# Patient Record
Sex: Female | Born: 1945 | Race: White | Hispanic: No | Marital: Married | Smoking: Never smoker
Health system: Southern US, Community
[De-identification: ages and names within clinical notes are randomized; demographics above are authoritative.]

## PROBLEM LIST (undated history)

## (undated) DIAGNOSIS — Z800 Family history of malignant neoplasm of digestive organs: Secondary | ICD-10-CM

## (undated) DIAGNOSIS — Z806 Family history of leukemia: Secondary | ICD-10-CM

## (undated) DIAGNOSIS — K7400 Hepatic fibrosis, unspecified: Secondary | ICD-10-CM

## (undated) DIAGNOSIS — R32 Unspecified urinary incontinence: Secondary | ICD-10-CM

## (undated) DIAGNOSIS — K219 Gastro-esophageal reflux disease without esophagitis: Secondary | ICD-10-CM

## (undated) DIAGNOSIS — N393 Stress incontinence (female) (male): Secondary | ICD-10-CM

## (undated) DIAGNOSIS — E7800 Pure hypercholesterolemia, unspecified: Secondary | ICD-10-CM

## (undated) DIAGNOSIS — Z803 Family history of malignant neoplasm of breast: Secondary | ICD-10-CM

## (undated) DIAGNOSIS — R5381 Other malaise: Secondary | ICD-10-CM

## (undated) DIAGNOSIS — I10 Essential (primary) hypertension: Secondary | ICD-10-CM

## (undated) DIAGNOSIS — T7840XA Allergy, unspecified, initial encounter: Secondary | ICD-10-CM

## (undated) DIAGNOSIS — M810 Age-related osteoporosis without current pathological fracture: Secondary | ICD-10-CM

## (undated) DIAGNOSIS — Z973 Presence of spectacles and contact lenses: Secondary | ICD-10-CM

## (undated) DIAGNOSIS — R7309 Other abnormal glucose: Secondary | ICD-10-CM

## (undated) DIAGNOSIS — C801 Malignant (primary) neoplasm, unspecified: Secondary | ICD-10-CM

## (undated) DIAGNOSIS — IMO0002 Reserved for concepts with insufficient information to code with codable children: Secondary | ICD-10-CM

## (undated) DIAGNOSIS — F419 Anxiety disorder, unspecified: Secondary | ICD-10-CM

## (undated) DIAGNOSIS — R0989 Other specified symptoms and signs involving the circulatory and respiratory systems: Secondary | ICD-10-CM

## (undated) DIAGNOSIS — Z807 Family history of other malignant neoplasms of lymphoid, hematopoietic and related tissues: Secondary | ICD-10-CM

## (undated) HISTORY — DX: Anxiety disorder, unspecified: F41.9

## (undated) HISTORY — PX: COLONOSCOPY: SHX174

## (undated) HISTORY — PX: SQUAMOUS CELL CARCINOMA EXCISION: SHX2433

## (undated) HISTORY — DX: Family history of malignant neoplasm of digestive organs: Z80.0

## (undated) HISTORY — DX: Family history of leukemia: Z80.6

## (undated) HISTORY — DX: Hepatic fibrosis, unspecified: K74.00

## (undated) HISTORY — DX: Hepatic fibrosis: K74.0

## (undated) HISTORY — DX: Family history of other malignant neoplasms of lymphoid, hematopoietic and related tissues: Z80.7

## (undated) HISTORY — DX: Essential (primary) hypertension: I10

## (undated) HISTORY — DX: Pure hypercholesterolemia, unspecified: E78.00

## (undated) HISTORY — DX: Family history of malignant neoplasm of breast: Z80.3

## (undated) HISTORY — DX: Reserved for concepts with insufficient information to code with codable children: IMO0002

## (undated) HISTORY — DX: Gastro-esophageal reflux disease without esophagitis: K21.9

---
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
## 1988-02-21 HISTORY — PX: ABDOMINAL HYSTERECTOMY: SHX81

## 1997-06-02 ENCOUNTER — Ambulatory Visit (HOSPITAL_COMMUNITY): Admission: RE | Admit: 1997-06-02 | Discharge: 1997-06-02 | Payer: Self-pay | Admitting: *Deleted

## 1998-07-09 ENCOUNTER — Ambulatory Visit (HOSPITAL_COMMUNITY): Admission: RE | Admit: 1998-07-09 | Discharge: 1998-07-09 | Payer: Self-pay | Admitting: Family Medicine

## 1998-07-09 ENCOUNTER — Encounter: Payer: Self-pay | Admitting: Family Medicine

## 1999-08-17 ENCOUNTER — Encounter: Payer: Self-pay | Admitting: Family Medicine

## 1999-08-17 ENCOUNTER — Ambulatory Visit (HOSPITAL_COMMUNITY): Admission: RE | Admit: 1999-08-17 | Discharge: 1999-08-17 | Payer: Self-pay | Admitting: Family Medicine

## 2000-09-11 ENCOUNTER — Encounter: Payer: Self-pay | Admitting: Family Medicine

## 2000-09-11 ENCOUNTER — Ambulatory Visit (HOSPITAL_COMMUNITY): Admission: RE | Admit: 2000-09-11 | Discharge: 2000-09-11 | Payer: Self-pay | Admitting: Family Medicine

## 2002-07-01 ENCOUNTER — Encounter: Payer: Self-pay | Admitting: Family Medicine

## 2002-07-01 ENCOUNTER — Ambulatory Visit (HOSPITAL_COMMUNITY): Admission: RE | Admit: 2002-07-01 | Discharge: 2002-07-01 | Payer: Self-pay | Admitting: Family Medicine

## 2002-11-13 ENCOUNTER — Encounter: Payer: Self-pay | Admitting: Family Medicine

## 2002-11-13 ENCOUNTER — Encounter: Admission: RE | Admit: 2002-11-13 | Discharge: 2002-11-13 | Payer: Self-pay | Admitting: Family Medicine

## 2002-12-22 ENCOUNTER — Ambulatory Visit (HOSPITAL_COMMUNITY): Admission: RE | Admit: 2002-12-22 | Discharge: 2002-12-22 | Payer: Self-pay | Admitting: Gastroenterology

## 2003-10-20 ENCOUNTER — Ambulatory Visit (HOSPITAL_COMMUNITY): Admission: RE | Admit: 2003-10-20 | Discharge: 2003-10-20 | Payer: Self-pay | Admitting: Family Medicine

## 2004-11-03 DIAGNOSIS — F411 Generalized anxiety disorder: Secondary | ICD-10-CM | POA: Insufficient documentation

## 2004-11-03 DIAGNOSIS — K2100 Gastro-esophageal reflux disease with esophagitis, without bleeding: Secondary | ICD-10-CM | POA: Insufficient documentation

## 2004-11-03 DIAGNOSIS — I10 Essential (primary) hypertension: Secondary | ICD-10-CM | POA: Insufficient documentation

## 2004-11-03 DIAGNOSIS — R32 Unspecified urinary incontinence: Secondary | ICD-10-CM

## 2004-11-03 HISTORY — DX: Unspecified urinary incontinence: R32

## 2004-11-23 DIAGNOSIS — M159 Polyosteoarthritis, unspecified: Secondary | ICD-10-CM | POA: Insufficient documentation

## 2005-01-09 ENCOUNTER — Ambulatory Visit (HOSPITAL_COMMUNITY): Admission: RE | Admit: 2005-01-09 | Discharge: 2005-01-09 | Payer: Self-pay | Admitting: Family Medicine

## 2006-01-18 ENCOUNTER — Ambulatory Visit (HOSPITAL_COMMUNITY): Admission: RE | Admit: 2006-01-18 | Discharge: 2006-01-18 | Payer: Self-pay | Admitting: Family Medicine

## 2007-01-22 ENCOUNTER — Ambulatory Visit (HOSPITAL_COMMUNITY): Admission: RE | Admit: 2007-01-22 | Discharge: 2007-01-22 | Payer: Self-pay | Admitting: Family Medicine

## 2007-05-07 ENCOUNTER — Ambulatory Visit (HOSPITAL_BASED_OUTPATIENT_CLINIC_OR_DEPARTMENT_OTHER): Admission: RE | Admit: 2007-05-07 | Discharge: 2007-05-07 | Payer: Self-pay | Admitting: Urology

## 2008-02-18 ENCOUNTER — Ambulatory Visit (HOSPITAL_COMMUNITY): Admission: RE | Admit: 2008-02-18 | Discharge: 2008-02-18 | Payer: Self-pay | Admitting: Family Medicine

## 2008-02-21 HISTORY — PX: BLADDER SURGERY: SHX569

## 2009-04-20 ENCOUNTER — Ambulatory Visit (HOSPITAL_COMMUNITY): Admission: RE | Admit: 2009-04-20 | Discharge: 2009-04-20 | Payer: Self-pay | Admitting: Family Medicine

## 2010-02-02 DIAGNOSIS — R5381 Other malaise: Secondary | ICD-10-CM | POA: Insufficient documentation

## 2010-02-02 HISTORY — DX: Other malaise: R53.81

## 2010-06-14 ENCOUNTER — Other Ambulatory Visit (HOSPITAL_COMMUNITY): Payer: Self-pay | Admitting: Family Medicine

## 2010-06-14 DIAGNOSIS — Z1231 Encounter for screening mammogram for malignant neoplasm of breast: Secondary | ICD-10-CM

## 2010-06-14 DIAGNOSIS — M8580 Other specified disorders of bone density and structure, unspecified site: Secondary | ICD-10-CM

## 2010-07-05 NOTE — Op Note (Signed)
NAMESHERONICA, COREY NO.:  [id number]   MEDICAL RECORD NO.:  [id number]          PATIENT TYPE:  AMB   LOCATION:  [facility]                         FACILITY:  [facility]   PHYSICIAN:  Domingo Pulse, M.D.  DATE OF BIRTH:  December 13, 1945   DATE OF PROCEDURE:  05/07/2007  DATE OF DISCHARGE:                               OPERATIVE REPORT   PREOPERATIVE DIAGNOSIS:  Stress urinary incontinence.   POSTOPERATIVE DIAGNOSIS:  Stress urinary incontinence.   PROCEDURE:  Cystoscopy and pubovaginal sling.   SURGEON:  Domingo Pulse, M.D.   ANESTHESIA:  General.   COMPLICATIONS:  None.   DRAINS:  Foley catheter removed postoperatively.   HISTORY:  This 65 year old female has well documented stress urinary  continence.  She has a little of a voiding dysfunction not much the way  of urgency on her urodynamic study.  Cystoscopic examination revealed a  patulous bladder neck.  The patient is clearly symptomatic for stress  urinary incontinence and would like to have a pubovaginal sling.  She  understands the risks and benefits including the possibility of  postoperative urinary retention requiring additional revision.  We also  discussed the possible risk for erosion or extravasation.  The patient  understands the risks and benefits of the procedure and gave full  informed consent.   PROCEDURE:  After successful induction of general anesthesia the patient  was placed in the dorsal lithotomy position, prepped with Betadine and  draped in the usual sterile fashion.  Careful bimanual examination  revealed that the patient had very little in the way of a rectocele or  cystocele and the vault was well-supported.  Her anterior vaginal mucosa  was infiltrated with a local anesthetic.  The incision was made directly  over the midportion of the urethra.  Flaps of the vaginal mucosa were  raised bilaterally.  Dissection proceeded back to but not through the  endopelvic fascia.  Two small  incisions were made to directed above the  pubis approximately three fingerbreadths apart.  A needle was then  passed from the small abdominal incision down to the vaginal incision on  each side.  Cystoscopic examination performed with both the 12 degree  and 70 degree lenses.  The bladder was carefully inspected.  No tumors  or stones could be seen.  Both orifices normal in configuration and  location.  The needle did not enter the bladder in any way and place and  appeared to be optimal.  The sling was then attached to the needle and  was pulled up through the top incision.  The sling tension was then set  using a Mayo scissors between the urethra and the sling.  Cystoscopic  examination done as the sling was being set showed that the urethra was  then a neutral position without angulation.  The bladder did not leak  but with a crede maneuver there was a prompt straight flow of urine and  it appeared that this was not over or under corrected.  The blue tab was  then cut in the midline and the sheath for the sling was removed setting  the tension.  The incision was irrigated and then closed with 2-0  Vicryl.  The sling was cut at the skin surface and Dermabond was used to  close the skin.  The patient had packing placed that will be removed  prior to her voiding trial.  The patient tolerated procedure well and  was taken to recovery room in  good condition.  She will be given Tylox as well as doxycycline and will  return to see me in follow-up in two weeks' time.  If the patient is  unable to urinate, she will be sent home for several days with Foley  catheter and then have an early voiding trial.      Domingo Pulse, M.D.  Electronically Signed     RJE/MEDQ  D:  05/07/2007  T:  05/07/2007  Job:  [id number]

## 2010-07-08 NOTE — Op Note (Signed)
   NAMESHAUNTEE, KARP                       ACCOUNT NO.:  [id number]   MEDICAL RECORD NO.:  [id number]                   PATIENT TYPE:  AMB   LOCATION:  [facility]O                                 FACILITY:  [facility]   PHYSICIAN:  Nelwyn Salisbury, M.D.               DATE OF BIRTH:  1945/04/19   DATE OF PROCEDURE:  12/22/2002  DATE OF DISCHARGE:                                 OPERATIVE REPORT   PROCEDURE:  Screening colonoscopy.   ENDOSCOPIST:  Nelwyn Salisbury, M.D.   INSTRUMENT USED:  Olympus video colonoscope.   INDICATIONS FOR PROCEDURE:  A 65 year old white female undergoing screening  colonoscopy to rule out colonic polyps, masses, etc.   PREPROCEDURE PREPARATION:  Informed consent was procured from the patient.  The patient was fasted for eight hours prior to the procedure and prepped  with a bottle of MiraLax and Gatorade the night prior to the procedure.   PREPROCEDURE PHYSICAL EXAMINATION:  VITAL SIGNS:  Stable.  NECK:  Supple.  CHEST:  Clear to auscultation. S1 and S2 regular.  ABDOMEN:  Soft with normal bowel sounds.   DESCRIPTION OF PROCEDURE:  The patient was placed in the left lateral  decubitus position and sedated 70 mg of Demerol and 7 mg of Versed in slow  incremental doses.  Once the patient was adequately sedated and maintained  on low flow oxygen, continuous cardiac monitoring, the Olympus video  colonoscope was advanced from the rectum to the cecum with difficulty.  There was a large amount of residual stool in the colon.  Multiple washings  were done.  The appendiceal orifice and ileocecal valve were visualized and  photographed.  The terminal ileum appeared normal.  No masses, polyps,  erosions, ulcerations, or diverticuli were seen.  Prominent anal papilla  were seen on retroflexion with small internal hemorrhoids.  The patient  tolerated the procedure well without complications.   IMPRESSION:  1. Small nonbleeding internal hemorrhoids.  2. Prominent  anal papilla.  3. No masses or polyps seen.  4. Large amount of residual stool in the colon.  Small lesions could have     been missed.    RECOMMENDATIONS:  Continue a high fiber diet with liberal fluid intake.  Repeat CRC screening in the next five years as visualization was not  adequate on colonoscopy today.  Outpatient follow-up is to be advised in the  future.                                               Nelwyn Salisbury, M.D.    JNM/MEDQ  D:  12/22/2002  T:  12/22/2002  Job:  [id number]   cc:   Youlanda Roys. Deatra Ina, M.D.  [address]  [place]  [place] [address]  Fax: [phone]

## 2010-07-14 ENCOUNTER — Ambulatory Visit (HOSPITAL_COMMUNITY)
Admission: RE | Admit: 2010-07-14 | Discharge: 2010-07-14 | Disposition: A | Discharge status: Home / Self Care | Payer: Federal, State, Local not specified - PPO | Source: Ambulatory Visit | Attending: Family Medicine | Admitting: Family Medicine

## 2010-07-14 DIAGNOSIS — Z1231 Encounter for screening mammogram for malignant neoplasm of breast: Secondary | ICD-10-CM | POA: Insufficient documentation

## 2010-07-14 DIAGNOSIS — M8580 Other specified disorders of bone density and structure, unspecified site: Secondary | ICD-10-CM

## 2010-07-14 DIAGNOSIS — Z13820 Encounter for screening for osteoporosis: Secondary | ICD-10-CM | POA: Insufficient documentation

## 2010-07-14 DIAGNOSIS — Z780 Asymptomatic menopausal state: Secondary | ICD-10-CM | POA: Insufficient documentation

## 2010-08-04 DIAGNOSIS — F3289 Other specified depressive episodes: Secondary | ICD-10-CM | POA: Insufficient documentation

## 2010-08-04 DIAGNOSIS — F329 Major depressive disorder, single episode, unspecified: Secondary | ICD-10-CM | POA: Insufficient documentation

## 2010-11-14 LAB — POCT I-STAT 4, (NA,K, GLUC, HGB,HCT)
Glucose, Bld: 98
Hemoglobin: 12.6
Sodium: 140

## 2011-05-02 DIAGNOSIS — R7309 Other abnormal glucose: Secondary | ICD-10-CM

## 2011-05-02 HISTORY — DX: Other abnormal glucose: R73.09

## 2011-08-01 ENCOUNTER — Other Ambulatory Visit (HOSPITAL_COMMUNITY): Payer: Self-pay | Admitting: Family Medicine

## 2011-08-01 DIAGNOSIS — Z1231 Encounter for screening mammogram for malignant neoplasm of breast: Secondary | ICD-10-CM

## 2011-08-10 DIAGNOSIS — M810 Age-related osteoporosis without current pathological fracture: Secondary | ICD-10-CM | POA: Insufficient documentation

## 2011-08-31 ENCOUNTER — Ambulatory Visit (HOSPITAL_COMMUNITY)
Admission: RE | Admit: 2011-08-31 | Discharge: 2011-08-31 | Disposition: A | Discharge status: Home / Self Care | Payer: Federal, State, Local not specified - PPO | Source: Ambulatory Visit | Attending: Family Medicine | Admitting: Family Medicine

## 2011-08-31 DIAGNOSIS — Z1231 Encounter for screening mammogram for malignant neoplasm of breast: Secondary | ICD-10-CM | POA: Insufficient documentation

## 2012-08-26 DIAGNOSIS — E782 Mixed hyperlipidemia: Secondary | ICD-10-CM | POA: Insufficient documentation

## 2012-09-16 ENCOUNTER — Other Ambulatory Visit (HOSPITAL_COMMUNITY): Payer: Self-pay | Admitting: Family Medicine

## 2012-09-16 DIAGNOSIS — Z1231 Encounter for screening mammogram for malignant neoplasm of breast: Secondary | ICD-10-CM

## 2012-09-16 DIAGNOSIS — M810 Age-related osteoporosis without current pathological fracture: Secondary | ICD-10-CM

## 2012-10-23 ENCOUNTER — Ambulatory Visit (HOSPITAL_COMMUNITY)
Admission: RE | Admit: 2012-10-23 | Discharge: 2012-10-23 | Disposition: A | Discharge status: Home / Self Care | Payer: Medicare Other | Source: Ambulatory Visit | Attending: Family Medicine | Admitting: Family Medicine

## 2012-10-23 DIAGNOSIS — Z1231 Encounter for screening mammogram for malignant neoplasm of breast: Secondary | ICD-10-CM | POA: Insufficient documentation

## 2012-10-23 DIAGNOSIS — M810 Age-related osteoporosis without current pathological fracture: Secondary | ICD-10-CM

## 2012-10-23 DIAGNOSIS — Z780 Asymptomatic menopausal state: Secondary | ICD-10-CM | POA: Insufficient documentation

## 2012-10-23 DIAGNOSIS — Z13820 Encounter for screening for osteoporosis: Secondary | ICD-10-CM | POA: Insufficient documentation

## 2013-05-30 DIAGNOSIS — N63 Unspecified lump in unspecified breast: Secondary | ICD-10-CM | POA: Diagnosis not present

## 2013-06-02 ENCOUNTER — Ambulatory Visit
Admission: RE | Admit: 2013-06-02 | Discharge: 2013-06-02 | Disposition: A | Discharge status: Home / Self Care | Payer: Federal, State, Local not specified - PPO | Source: Ambulatory Visit | Attending: Nurse Practitioner | Admitting: Nurse Practitioner

## 2013-06-02 ENCOUNTER — Ambulatory Visit
Admission: RE | Admit: 2013-06-02 | Discharge: 2013-06-02 | Disposition: A | Discharge status: Home / Self Care | Payer: Medicare Other | Source: Ambulatory Visit | Attending: Nurse Practitioner | Admitting: Nurse Practitioner

## 2013-06-02 ENCOUNTER — Other Ambulatory Visit: Payer: Self-pay | Admitting: Nurse Practitioner

## 2013-06-02 DIAGNOSIS — N63 Unspecified lump in unspecified breast: Secondary | ICD-10-CM

## 2013-06-02 DIAGNOSIS — C50919 Malignant neoplasm of unspecified site of unspecified female breast: Secondary | ICD-10-CM | POA: Diagnosis not present

## 2013-06-02 DIAGNOSIS — Z803 Family history of malignant neoplasm of breast: Secondary | ICD-10-CM | POA: Diagnosis not present

## 2013-06-03 ENCOUNTER — Other Ambulatory Visit: Payer: Self-pay | Admitting: Nurse Practitioner

## 2013-06-03 ENCOUNTER — Ambulatory Visit
Admission: RE | Admit: 2013-06-03 | Discharge: 2013-06-03 | Disposition: A | Discharge status: Home / Self Care | Payer: Medicare Other | Source: Ambulatory Visit | Attending: Nurse Practitioner | Admitting: Nurse Practitioner

## 2013-06-03 DIAGNOSIS — C50919 Malignant neoplasm of unspecified site of unspecified female breast: Secondary | ICD-10-CM

## 2013-06-03 DIAGNOSIS — N63 Unspecified lump in unspecified breast: Secondary | ICD-10-CM

## 2013-06-05 ENCOUNTER — Telehealth: Payer: Self-pay | Admitting: *Deleted

## 2013-06-05 DIAGNOSIS — Z170 Estrogen receptor positive status [ER+]: Secondary | ICD-10-CM

## 2013-06-05 DIAGNOSIS — C50411 Malignant neoplasm of upper-outer quadrant of right female breast: Secondary | ICD-10-CM

## 2013-06-05 NOTE — Telephone Encounter (Signed)
Confirmed [facility] for 06/11/13 at 1230pm .  Instructions and contact information given.

## 2013-06-09 ENCOUNTER — Ambulatory Visit
Admission: RE | Admit: 2013-06-09 | Discharge: 2013-06-09 | Disposition: A | Discharge status: Home / Self Care | Payer: Medicare Other | Source: Ambulatory Visit | Attending: Nurse Practitioner | Admitting: Nurse Practitioner

## 2013-06-09 DIAGNOSIS — C50919 Malignant neoplasm of unspecified site of unspecified female breast: Secondary | ICD-10-CM | POA: Diagnosis not present

## 2013-06-09 MED ORDER — GADOBENATE DIMEGLUMINE 529 MG/ML IV SOLN
12.0000 mL | Freq: Once | INTRAVENOUS | Status: AC | PRN
  Administered 2013-06-09: 12 mL via INTRAVENOUS

## 2013-06-11 ENCOUNTER — Encounter (INDEPENDENT_AMBULATORY_CARE_PROVIDER_SITE_OTHER): Payer: Self-pay

## 2013-06-11 ENCOUNTER — Ambulatory Visit (HOSPITAL_BASED_OUTPATIENT_CLINIC_OR_DEPARTMENT_OTHER): Payer: Medicare Other | Admitting: Oncology

## 2013-06-11 ENCOUNTER — Telehealth: Payer: Self-pay | Admitting: Oncology

## 2013-06-11 ENCOUNTER — Ambulatory Visit: Payer: Medicare Other

## 2013-06-11 ENCOUNTER — Encounter: Payer: Self-pay | Admitting: Oncology

## 2013-06-11 ENCOUNTER — Ambulatory Visit
Admission: RE | Admit: 2013-06-11 | Discharge: 2013-06-11 | Disposition: A | Discharge status: Home / Self Care | Payer: Medicare Other | Source: Ambulatory Visit | Attending: Radiation Oncology | Admitting: Radiation Oncology

## 2013-06-11 ENCOUNTER — Ambulatory Visit (HOSPITAL_BASED_OUTPATIENT_CLINIC_OR_DEPARTMENT_OTHER): Payer: Medicare Other | Admitting: General Surgery

## 2013-06-11 ENCOUNTER — Ambulatory Visit: Payer: Medicare Other | Attending: General Surgery | Admitting: Physical Therapy

## 2013-06-11 VITALS — BP 152/76 | HR 69 | Temp 98.5°F | Resp 18 | Ht 59.0 in | Wt 133.8 lb

## 2013-06-11 DIAGNOSIS — C50411 Malignant neoplasm of upper-outer quadrant of right female breast: Secondary | ICD-10-CM

## 2013-06-11 DIAGNOSIS — Z170 Estrogen receptor positive status [ER+]: Secondary | ICD-10-CM | POA: Diagnosis not present

## 2013-06-11 DIAGNOSIS — C50419 Malignant neoplasm of upper-outer quadrant of unspecified female breast: Secondary | ICD-10-CM

## 2013-06-11 DIAGNOSIS — C50919 Malignant neoplasm of unspecified site of unspecified female breast: Secondary | ICD-10-CM | POA: Diagnosis not present

## 2013-06-11 DIAGNOSIS — R293 Abnormal posture: Secondary | ICD-10-CM | POA: Insufficient documentation

## 2013-06-11 DIAGNOSIS — IMO0001 Reserved for inherently not codable concepts without codable children: Secondary | ICD-10-CM | POA: Diagnosis not present

## 2013-06-11 LAB — COMPREHENSIVE METABOLIC PANEL (CC13)
ALT: 25 U/L (ref 0–55)
AST: 24 U/L (ref 5–34)
Albumin: 4.1 g/dL (ref 3.5–5.0)
Alkaline Phosphatase: 64 U/L (ref 40–150)
Anion Gap: 12 mEq/L — ABNORMAL HIGH (ref 3–11)
BILIRUBIN TOTAL: 0.26 mg/dL (ref 0.20–1.20)
BUN: 11 mg/dL (ref 7.0–26.0)
CALCIUM: 10 mg/dL (ref 8.4–10.4)
CHLORIDE: 112 meq/L — AB (ref 98–109)
CO2: 19 meq/L — AB (ref 22–29)
Creatinine: 0.7 mg/dL (ref 0.6–1.1)
Glucose: 107 mg/dl (ref 70–140)
POTASSIUM: 4 meq/L (ref 3.5–5.1)
Sodium: 143 mEq/L (ref 136–145)
Total Protein: 7.5 g/dL (ref 6.4–8.3)

## 2013-06-11 LAB — CBC WITH DIFFERENTIAL/PLATELET
BASO%: 0.7 % (ref 0.0–2.0)
Basophils Absolute: 0.1 10*3/uL (ref 0.0–0.1)
EOS ABS: 0 10*3/uL (ref 0.0–0.5)
EOS%: 0.5 % (ref 0.0–7.0)
HCT: 38.3 % (ref 34.8–46.6)
HGB: 12.6 g/dL (ref 11.6–15.9)
LYMPH%: 20.6 % (ref 14.0–49.7)
MCH: 31.1 pg (ref 25.1–34.0)
MCHC: 32.8 g/dL (ref 31.5–36.0)
MCV: 94.9 fL (ref 79.5–101.0)
MONO#: 0.6 10*3/uL (ref 0.1–0.9)
MONO%: 7.1 % (ref 0.0–14.0)
NEUT%: 71.1 % (ref 38.4–76.8)
NEUTROS ABS: 6.3 10*3/uL (ref 1.5–6.5)
Platelets: 354 10*3/uL (ref 145–400)
RBC: 4.04 10*6/uL (ref 3.70–5.45)
RDW: 12.5 % (ref 11.2–14.5)
WBC: 8.9 10*3/uL (ref 3.9–10.3)
lymph#: 1.8 10*3/uL (ref 0.9–3.3)

## 2013-06-11 NOTE — Progress Notes (Signed)
[place]  Telephone:(336) [phone] Fax:(336) [phone]     ID: [name] OB: 11/20/1945  MR#: [id number]  W[facility]#:[id number]  PCP: Florina Ou, MD GYN:   SU: Excell Seltzer OTHER MD: Kyung Rudd  CHIEF COMPLAINT: "I found a lump in the right breast"  BREAST CANCER HISTORY: Mignon was bending over to get into her hot tub when she noted a mass in her right breast. She intended to keep it to herself, but her husband of 54 years, [name], noted her changed expression, asked her what it was about, and when she told him they got out of the top and called on her primary care physician. On 06/02/2013 the patient underwent right breast mammography and ultrasonography,  which showed an area of increased density in the superior aspect of the right breast, which was palpable and firm. By palpation it measured approximately 5 cm. Ultrasound located an irregular mass in the right breast at the 12:00 position measuring 4 cm maximally. Ultrasound of the right axilla showed normal axillary contents.  Biopsy of the right breast mass in question 06/02/2013 ([id number]) confirmed an invasive lobular tumor, grade 2, estrogen and progesterone receptors both 100% positive in both with strong staining intensity, with an MIB-1 of 80%. HER-2 is pending.  On 06/03/2013 the patient underwent left breast mammography, which was negative. On 06/09/2013 the patient underwent bilateral breast MRI. This showed, in the left breast, multiple enhancing nodules in the central portion measuring up to 4.6 cm in aggregate. There was evidence of nipple retraction. The largest single  discrete mass measured 2.7 cm. There was no involvement of the pectoralis and no abnormal appearing lymph nodes. The right breast was unremarkable.  The patient's subsequent history is as detailed below  INTERVAL HISTORY: Menarche was evaluated in the multidisciplinary breast cancer clinic accompanied by her husband Wille Glaser and their  daughters Chrissie and Almyra Free  REVIEW OF SYSTEMS: Aside from the breast changes, there were no specific symptoms leading to the diagnostic mammogram. The patient denies unusual headaches, visual changes, nausea, vomiting, stiff neck, dizziness, or gait imbalance. There has been no cough, phlegm production, or pleurisy, no chest pain or pressure, and no change in bowel or bladder habits. The patient denies fever, rash, bleeding, unexplained fatigue or unexplained weight loss. The patient complains of rare hot flashes and occasional night sweats. She feels anxious but not depressed. A detailed review of systems was otherwise entirely negative.  PAST MEDICAL HISTORY: Past Medical History  Diagnosis Date  . Hypertension   . GERD (gastroesophageal reflux disease)   . High cholesterol   . Anxiety     PAST SURGICAL HISTORY: Past Surgical History  Procedure Laterality Date  . Abdominal hysterectomy    . Bladder surgery      FAMILY HISTORY Family History  Problem Relation Age of Onset  . Breast cancer Mother   . Breast cancer Paternal Aunt   . Lymphoma Cousin     non- hodgkins lymphoma   the patient's father died with congestive heart failure at the age of 52. The patient's mother died from "multiple causes" at the age of 16. Baker Janus had one brother, no sisters. The patient's mother was diagnosed with breast cancer the age of 64 there is also a paternal aunt diagnosed with breast cancer at the age of 32.  GYNECOLOGIC HISTORY:  Menarche age 68, first live birth age 68, the patient is [place] P2. She stopped having periods approximately 1990. She did not take hormone replacement.  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 Her husband Broadus John (goes by "choked") used to work Sales executive stations. Their name is pronounced "Nam-zura"--it is of [place] origin. Daughter Alyse Low lives in  [place] for she is a Nurse, children's and daughter Almyra Free lives in [place] where she is a housewife. The patient has 1 grandchild. She attends a [employer].    ADVANCED DIRECTIVES: Not in place   HEALTH MAINTENANCE: History  Substance Use Topics  . Smoking status: Never Smoker   . Smokeless tobacco: Not on file  . Alcohol Use: 3.0 oz/week    5 Glasses of wine per week     Colonoscopy: 2001?/Dr. Collene Mares  PAP:  Bone density: 2014/osteopenia  Lipid panel:  Allergies not on file  No current outpatient prescriptions on file.   No current facility-administered medications for this visit.    OBJECTIVE: Middle-aged white woman who appears stated age 68 Vitals:   06/11/13 1317  BP: 152/76  Pulse: 69  Temp: 98.5 F (36.9 C)  Resp: 18     Body mass index is 27.01 kg/(m^2).    ECOG FS:0 - Asymptomatic  Ocular: Sclerae unicteric, pupils equal, round and reactive to light Ear-nose-throat: Oropharynx clear and moist Lymphatic: No cervical or supraclavicular adenopathy Lungs no rales or rhonchi, good excursion bilaterally Heart regular rate and rhythm, no murmur appreciated Abd soft, nontender, positive bowel sounds MSK no focal spinal tenderness, no joint edema Neuro: non-focal, well-oriented, appropriate affect Breasts: The right breast is status post recent biopsy. The right nipple is slightly sunken, and there is a palpable area of thickening in the subareolar area measuring perhaps 4 cm. It is difficult to delineate. There are no skin changes of concern. The right axilla is benign. Left breast is unremarkable.   LAB RESULTS:  CMP     Component Value Date/Time   NA 143 06/11/2013 1306   NA 140 05/07/2007 0721   K 4.0 06/11/2013 1306   K 4.1 05/07/2007 0721   CO2 19* 06/11/2013 1306   GLUCOSE 107 06/11/2013 1306   GLUCOSE 98 05/07/2007 0721   BUN 11.0 06/11/2013 1306   CREATININE 0.7 06/11/2013 1306   CALCIUM 10.0 06/11/2013 1306   PROT 7.5 06/11/2013 1306     ALBUMIN 4.1 06/11/2013 1306   AST 24 06/11/2013 1306   ALT 25 06/11/2013 1306   ALKPHOS 64 06/11/2013 1306   BILITOT 0.26 06/11/2013 1306    I No results found for this basename: SPEP, UPEP,  kappa and lambda light chains    Lab Results  Component Value Date   WBC 8.9 06/11/2013   NEUTROABS 6.3 06/11/2013   HGB 12.6 06/11/2013   HCT 38.3 06/11/2013   MCV 94.9 06/11/2013   PLT 354 06/11/2013      Chemistry      Component Value Date/Time   NA 143 06/11/2013 1306   NA 140 05/07/2007 0721   K 4.0 06/11/2013 1306   K 4.1 05/07/2007 0721   CO2 19* 06/11/2013 1306   BUN 11.0 06/11/2013 1306   CREATININE 0.7 06/11/2013 1306      Component Value Date/Time   CALCIUM 10.0 06/11/2013 1306   ALKPHOS 64 06/11/2013 1306   AST 24 06/11/2013 1306   ALT 25 06/11/2013 1306   BILITOT 0.26 06/11/2013 1306       No results found for this basename: LABCA2  No components found with this basename: GLOVF643    No results found for this basename: INR,  in the last 168 hours  Urinalysis No results found for this basename: colorurine, appearanceur, labspec, phurine, glucoseu, hgbur, bilirubinur, ketonesur, proteinur, urobilinogen, nitrite, leukocytesur    STUDIES: Mr Breast Bilateral W Wo Contrast  06/09/2013   CLINICAL DATA:  Recent diagnosis of invasive mammary carcinoma, 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  LABS:  BUN and creatinine were obtained on site at [facility] at  [address]  Results:  BUN 13 mg/dL,  Creatinine 0.6 mg/dL.  EXAM: BILATERAL BREAST MRI WITH AND WITHOUT CONTRAST  TECHNIQUE: Multiplanar, multisequence MR images of both breasts were obtained prior to and following the intravenous administration of 78m of MultiHance.  THREE-DIMENSIONAL MR IMAGE RENDERING ON INDEPENDENT WORKSTATION:  Three-dimensional MR images were rendered by post-processing of the original MR  data on an independent workstation. The three-dimensional MR images were interpreted, and findings are reported in the following complete MRI report for this study. Three dimensional images were evaluated at the independent DynaCad workstation  COMPARISON:  [facility] and earlier  FINDINGS: Breast composition: c:  Heterogeneous fibroglandular tissue  Background parenchymal enhancement: Minimal  Right breast: No mass or abnormal enhancement.  Left breast: There is right nipple retraction without nipple enhancement. Multiple nodules of enhancement are identified within the central portion of the right breast. Measured together, these are 4.6 x 3.3 x 3.7 cm. Enhancement is medium washout type kinetics. The largest discrete mass is 2.7 cm. Tissue marker clip is identified in the region of enhancement, consistent with recent ultrasound-guided core biopsy with showed malignancy. There is no pectoralis muscle involvement.  Lymph nodes: No abnormal appearing lymph nodes.  Ancillary findings:  None.  IMPRESSION: 1. 4.6 cm of abnormal enhancement in the central portion of the right breast. Findings are consistent with known malignancy. 2. Associated retraction of the right nipple without evidence for direct involvement of the nipple. 3. Left breast is negative.  RECOMMENDATION: Treatment plan  BI-RADS CATEGORY  6: Known biopsy-proven malignancy.   Electronically Signed   By: BShon HaleM.D.   On: 06/09/2013 13:27   Mm Digital Diagnostic Unilat L  06/03/2013   CLINICAL DATA:  Recently diagnosed right breast invasive mammary carcinoma. Pre MRI left breast mammogram requested.  EXAM: DIGITAL DIAGNOSTIC  left breast MAMMOGRAM WITH CAD  COMPARISON:  Previous examinations dated 10/23/2012, 08/31/2011, 07/14/2010, 04/20/2009.  ACR Breast Density Category c: The breast tissue is heterogeneously dense, which may obscure small masses.  FINDINGS: There is no mass, distortion,  or worrisome calcification within the left breast.  Mammographic images were processed with CAD.  IMPRESSION: Stable left breast parenchymal pattern. No findings worrisome for malignancy within the left breast.  RECOMMENDATION: Preoperative breast MRI. Treatment plan for known malignancy within the right breast.  I have discussed the findings and recommendations with the patient. Results were also provided in writing at the conclusion of the visit. If applicable, a reminder letter will be sent to the patient regarding the next appointment.  BI-RADS CATEGORY  1: Negative.   Electronically Signed   By: RLuberta RobertsonM.D.   On: 06/03/2013 16:04   Mm Digital Diagnostic Unilat R  06/02/2013   CLINICAL DATA:  Post right breast ultrasound-guided biopsy.  EXAM: POST-BIOPSY CLIP PLACEMENT right breast DIAGNOSTIC MAMMOGRAM  COMPARISON:  Previous exams.  FINDINGS: Films are performed following ultrasound guided biopsy of the mass located within the right breast at 12 o'clock position. The ribbon shaped clip is in appropriate position.  IMPRESSION: Appropriate position of clip following right breast ultrasound-guided core biopsy.  Final Assessment: Post Procedure Mammograms for Marker Placement   Electronically Signed   By: Luberta Robertson M.D.   On: 06/02/2013 15:49   Mm Digital Diagnostic Unilat R  06/02/2013   CLINICAL DATA:  Palpable right breast mass. Family history of breast cancer in her sister at age 54.  EXAM: DIGITAL DIAGNOSTIC  right breast MAMMOGRAM WITH CAD  ULTRASOUND right BREAST  COMPARISON:  10/23/2012, 08/31/2011, 07/14/2010, 04/20/2009.  ACR Breast Density Category c: The breast tissue is heterogeneously dense, which may obscure small masses.  FINDINGS: There is an area of focal increased density seen within the superior subareolar portion of the right breast located at the 12 o'clock position.  Mammographic images were processed with CAD.  On physical exam, there is a firm palpable mass located within  the right breast at 12 o'clock position 4 cm from the nipple which by physical examination measures approximately 5 cm in size. There is no palpable right axillary adenopathy.  Ultrasound is performed, showing a heterogeneous, irregular mass located within the right breast at 12 o'clock position 4 cm from the nipple measuring 4.0 x 3.7 x 2.7 cm in size. This is associated with increased vascularity on color-flow imaging. This is worrisome for invasive mammary carcinoma.  Ultrasound of the right axilla demonstrates normal axillary contents and no evidence for adenopathy.  I have discussed ultrasound-guided core biopsy of the right breast mass located at 12 o'clock position. The patient would like to proceed with this at this time. This will be performed and reported separately.  IMPRESSION: 4 cm suspicious mass located within the right breast at 12 o'clock position as discussed above. Tissue sampling is recommended and ultrasound-guided core biopsy will be performed and reported separately.  RECOMMENDATION: Right breast ultrasound-guided core biopsy.  I have discussed the findings and recommendations with the patient. Results were also provided in writing at the conclusion of the visit. If applicable, a reminder letter will be sent to the patient regarding the next appointment.  BI-RADS CATEGORY  4: Suspicious abnormality - biopsy should be considered.   Electronically Signed   By: Luberta Robertson M.D.   On: 06/02/2013 15:29   Mm Radiologist Eval And Mgmt  06/03/2013   EXAM: ESTABLISHED PATIENT OFFICE VISIT - LEVEL II ([id number])  CHIEF COMPLAINT: The patient returns for results following right breast ultrasound-guided core biopsy.  Current Pain Level: 2  HISTORY OF PRESENT ILLNESS: Patient noted a palpable mass within the right breast. Diagnostic mammography and ultrasound demonstrated a mass located within the right breast at 12 o'clock position and right breast ultrasound-guided core biopsy was performed. There is a  family history of breast cancer in her mother at age 71.  EXAM: There is moderate ecchymosis associated with the right breast ultrasound-guided core biopsy site. There is no hematoma formation and there are no signs of infection. Patient is mildly tender.  PATHOLOGY: The pathology demonstrated invasive mammary carcinoma felt to most likely be a lobular carcinoma - grade 2. The pathology is concordant with the imaging findings.  ASSESSMENT AND PLAN: ASSESSMENT AND PLAN The patient is referred to the breast cancer multi disciplinary clinic on 06/11/2013. Breast MRI is scheduled for 06/09/2013. Post biopsy wound care instructions were reviewed with the  patient. The patient was asked to contact the [facility] for additional questions or concerns.   Electronically Signed   By: Luberta Robertson M.D.   On: 06/03/2013 16:12   US Breast Ltd Uni Right Inc Axilla  06/02/2013   CLINICAL DATA:  Palpable right breast mass. Family history of breast cancer in her sister at age 39.  EXAM: DIGITAL DIAGNOSTIC  right breast MAMMOGRAM WITH CAD  ULTRASOUND right BREAST  COMPARISON:  10/23/2012, 08/31/2011, 07/14/2010, 04/20/2009.  ACR Breast Density Category c: The breast tissue is heterogeneously dense, which may obscure small masses.  FINDINGS: There is an area of focal increased density seen within the superior subareolar portion of the right breast located at the 12 o'clock position.  Mammographic images were processed with CAD.  On physical exam, there is a firm palpable mass located within the right breast at 12 o'clock position 4 cm from the nipple which by physical examination measures approximately 5 cm in size. There is no palpable right axillary adenopathy.  Ultrasound is performed, showing a heterogeneous, irregular mass located within the right breast at 12 o'clock position 4 cm from the nipple measuring 4.0 x 3.7 x 2.7 cm in size. This is associated with increased vascularity on color-flow imaging. This is worrisome for  invasive mammary carcinoma.  Ultrasound of the right axilla demonstrates normal axillary contents and no evidence for adenopathy.  I have discussed ultrasound-guided core biopsy of the right breast mass located at 12 o'clock position. The patient would like to proceed with this at this time. This will be performed and reported separately.  IMPRESSION: 4 cm suspicious mass located within the right breast at 12 o'clock position as discussed above. Tissue sampling is recommended and ultrasound-guided core biopsy will be performed and reported separately.  RECOMMENDATION: Right breast ultrasound-guided core biopsy.  I have discussed the findings and recommendations with the patient. Results were also provided in writing at the conclusion of the visit. If applicable, a reminder letter will be sent to the patient regarding the next appointment.  BI-RADS CATEGORY  4: Suspicious abnormality - biopsy should be considered.   Electronically Signed   By: Luberta Robertson M.D.   On: 06/02/2013 15:29   [place] Rt Breast Bx W Loc Dev 1st Lesion Img Bx Spec US Guide  06/02/2013   CLINICAL DATA:  Palpable right breast mass.  EXAM: ULTRASOUND GUIDED right BREAST CORE NEEDLE BIOPSY  COMPARISON:  Previous exams.  FINDINGS: I met with the patient and we discussed the procedure of fultrasound-guided biopsy, including benefits and alternatives. We discussed the high likelihood of a successful procedure. We discussed the risks of the procedure, including infection, bleeding, tissue injury, clip migration, and inadequate sampling. Informed written consent was given. The usual time-out protocol was performed immediately prior to the procedure.  Using sterile technique and 2% Lidocaine as local anesthetic, under direct ultrasound visualization, a 14 gauge spring-loaded device was used to perform biopsy of the mass located within the right breast at 12 o'clock position using a medial approach. At the conclusion of the procedure a ribbon shaped  tissue marker clip was deployed into the biopsy cavity. Follow up 2 view mammogram was performed and dictated separately.  IMPRESSION: Ultrasound guided biopsy of the right breast mass located at the 12 o'clock position as discussed above. No apparent complications.   Electronically Signed   By: Luberta Robertson M.D.   On: 06/02/2013 15:30    ASSESSMENT: 68 y.o. [place] woman status post right breast biopsy  06/02/2013 for a clinical T2 N0, stage IIA invasive lobular breast cancer, grade 2, estrogen receptor and progesterone receptor both outer percent positive, with an MIB-1 of 80%, and HER-2 pending  PLAN: We spent the better part of today's hour-long appointment discussing the biology of breast cancer in general, and the specifics of the patient's tumor in particular. Naoko understands that quite aside from local treatment, which in her case likely will consist of mastectomy and sentinel lymph node sampling, she will need some kind of systemic therapy.  Certainly she will benefit from antiestrogen. The question is whether she would benefit from chemotherapy as well. In general, lobular breast cancers tend to benefit less, but hers is grade 2 with a high proliferation fraction.  Accordingly we are sending an Oncotype in her case. This should help [place] towards a decision. We should be able to have those results within 3 weeks, and so I will see her around that time to discuss the chemotherapy option.  Meah has a good understanding of the overall plan. She agrees with it. She knows that the goal of treatment in her case is cure. She will call with any problems that may develop before her next visit here.   Chauncey Cruel, MD   06/11/2013 2:42 PM

## 2013-06-11 NOTE — Progress Notes (Signed)
Chief Complaint: New diagnosis of breast cancer  History:    Penny Howard is a 68 y.o. postmenopausal female referred by Dr. Luberta Robertson  for evaluation of recently diagnosed carcinoma of the right breast. The patient states that she recently noted inversion of her right nipple and then a central right breast mass several weeks ago. She presented to her family physician and was referred to the breast center for further evaluation.  Subsequent imaging included diagnostic mamogram showing an area of focal increased density in the superior and subareolar portion of the right breast 12:00 position and ultrasound showing a heterogeneous irregular mass at the 12:00 position and retroareolar measuring 4 cm. Ultrasound of the right axilla was negative..   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 ssubsequent breast MR has revealed a 4.6 cm area of nodular enhancement in the central and retroareolar right breast.She is seen now in breast multidisciplinary clinic for initial treatment planning.  She has experienced a breast lump on exam and nipple inversion as above.  She does not have a personal history of any previous breast problems.  Findings at that time were the following:  Tumor size: 4.6 cm  Tumor grade: 3  Estrogen Receptor: positive Progesterone Receptor: positive  Her-2 neu: pending  Lymph node status: negative Neurovascular invasion: no mention of biopsy Lymphatic invasion: no mention on biopsy  Past Medical History  Diagnosis Date  . Hypertension   . GERD (gastroesophageal reflux disease)   . High cholesterol   . Anxiety     Past Surgical History  Procedure Laterality Date  . Abdominal hysterectomy    . Bladder surgery      No current outpatient prescriptions on file.   No current facility-administered medications for this visit.    Family History  Problem Relation Age of Onset  . Breast  cancer Mother   . Breast cancer Paternal Aunt   . Lymphoma Cousin     non- hodgkins lymphoma    History   Social History  . Marital Status: Married    Spouse Name: N/A    Number of Children: N/A  . Years of Education: N/A   Social History Main Topics  . Smoking status: Never Smoker   . Smokeless tobacco: Not on file  . Alcohol Use: 3.0 oz/week    5 Glasses of wine per week  . Drug Use: No  . Sexual Activity: Not on file   Other Topics Concern  . Not on file   Social History Narrative  . No narrative on file     Review of Systems Constitutional: negative Respiratory: negative Cardiovascular: negative Gastrointestinal: negative, colonoscopy is up to date     Objective:  There were no vitals taken for this visit.  General: Alert, well-developed Caucasian female, in no distress Skin: Warm and dry without rash or infection. HEENT: No palpable masses or thyromegaly. Sclera nonicteric. Pupils equal round and reactive. Oropharynx clear. Breasts: there is bruising of the right breast post biopsy. Right nipple inversion is noted. There is a fairly large ill-defined mass just beneath and above the nipple. No fixation. No palpable axillary adenopathy bilaterally. Left breast is negative to exam. Lymph nodes: No cervical, supraclavicular, or inguinal nodes palpable. Lungs: Breath sounds clear and equal without increased work of breathing Cardiovascular: Regular rate and rhythm without murmur. No JVD or edema. Peripheral pulses intact. Abdomen: Nondistended. Soft and nontender. No masses palpable. No organomegaly. No palpable  hernias. Extremities: No edema or joint swelling or deformity. No chronic venous stasis changes. Neurologic: Alert and fully oriented. Gait normal.   Laboratory data:  CBC:  Lab Results  Component Value Date   WBC 8.9 06/11/2013   RBC 4.04 06/11/2013   HGB 12.6 06/11/2013   HGB 12.6 05/07/2007   HCT 38.3 06/11/2013   HCT 37.0 05/07/2007   PLT 354  06/11/2013  ]  [facility] Labs:  Lab Results  Component Value Date   NA 143 06/11/2013   NA 140 05/07/2007   K 4.0 06/11/2013   K 4.1 05/07/2007   CO2 19* 06/11/2013   BUN 11.0 06/11/2013   CREATININE 0.7 06/11/2013   CALCIUM 10.0 06/11/2013   PROT 7.5 06/11/2013   BILITOT 0.26 06/11/2013   ALKPHOS 64 06/11/2013   AST 24 06/11/2013   ALT 25 06/11/2013     Assessment  68 y.o. female with a new diagnosis of cancer of the the right breast central and upper breast.  Clinical IIA, estrogen receptor positive, progesterone receptor positive and Ki 67 - growth factor high. I discussed with the patient and family members present today initial surgical treatment options. We discussed options of breast conservation with lumpectomy or total mastectomy and sentinal lymph node biopsy/dissection. Due to the large size and the central location of her tumor I do not believe she would be a candidate for breast conservation and have recommended total mastectomy.Options for reconstruction were discussed. She is very interested in immediate reconstruction. I think this would be feasible unless she would require postmastectomy radiation which it seems she would not. After discussion they have elected to proceed with right total mastectomy.  We discussed the indications and nature of the procedure, and expected recovery, in detail. Surgical risks including anesthetic complications, cardiorespiratory complications, bleeding, infection, wound healing complications, blood clots, lymphedema, local and distant recurrence and possible need for further surgery based on the final pathology was discussed and understood.  Chemotherapy, hormonal therapy and radiation therapy have been discussed. They have been provided with literature regarding the treatment of breast cancer.  All questions were answered. They understand and agree to proceed and we will go ahead with scheduling.  Plan Right total mastectomy with axillary sentinel lymph node  biopsy and possible axillary dissection. The patient is interested in immediate reconstruction and we will refer to  Plastic surgery for consultation as soon as possible.  Edward Jolly MD, FACS  06/11/2013, 3:24 PM

## 2013-06-11 NOTE — Telephone Encounter (Signed)
, °

## 2013-06-11 NOTE — Progress Notes (Signed)
Checked in new pt with no financial concerns. °

## 2013-06-12 ENCOUNTER — Encounter: Payer: Self-pay | Admitting: *Deleted

## 2013-06-12 DIAGNOSIS — C50911 Malignant neoplasm of unspecified site of right female breast: Secondary | ICD-10-CM | POA: Insufficient documentation

## 2013-06-12 DIAGNOSIS — C50919 Malignant neoplasm of unspecified site of unspecified female breast: Secondary | ICD-10-CM | POA: Diagnosis not present

## 2013-06-12 NOTE — Progress Notes (Signed)
[place] Psychosocial Distress Screening Clinical Social Work  Clinical Social Work met with Patient and family at breast clinic.  The patient scored a 3 on the Psychosocial Distress Thermometer which indicates mild distress. Clinical Social Worker Intern spoke with to assess for distress and other psychosocial needs. Patient was given written information about Patient and Family support services.  Patient agrees to contact if further assistance is needed.   Clinical Social Worker follow up needed: no  If yes, follow up plan:   Alley Neils S. [place] Work Intern [employer] [phone]

## 2013-06-12 NOTE — Progress Notes (Signed)
Oncotype DX ordered per Dr. Jana Hakim.  Requisition sent to pathology.  Received by Edmonia Lynch

## 2013-06-16 ENCOUNTER — Telehealth: Payer: Self-pay | Admitting: *Deleted

## 2013-06-16 ENCOUNTER — Telehealth (INDEPENDENT_AMBULATORY_CARE_PROVIDER_SITE_OTHER): Payer: Self-pay | Admitting: General Surgery

## 2013-06-16 ENCOUNTER — Telehealth (INDEPENDENT_AMBULATORY_CARE_PROVIDER_SITE_OTHER): Payer: Self-pay

## 2013-06-16 NOTE — Telephone Encounter (Signed)
Patient is asking why is it taking so long to have her breast ca surgery scheduled, she feels her cancer is going to spread if she continues to wait. Please advise

## 2013-06-16 NOTE — Telephone Encounter (Signed)
Received call from patient stating she has left several messages for [facility] with no return phone call about scheduling her surgery.  She states she met with Dr. Iran Planas on 06/12/13 and has decided to have just the mastectomy right now without reconstruction.  Informed her I would send a message to Dr. Excell Seltzer and his nurse to call her and get her surgery scheduled.  Encouraged patient to call with any other needs or concerns.

## 2013-06-16 NOTE — Telephone Encounter (Signed)
pt is not coordinating with Dr Iran Planas when can she have surgery  States she is waiting .. I explained that Dr Excell Seltzer would be out of office and back first week of May he will check his email and can instruct that way  That his calender is scheduled out til late may or June

## 2013-06-17 ENCOUNTER — Encounter (HOSPITAL_COMMUNITY): Payer: Self-pay

## 2013-06-18 NOTE — Progress Notes (Signed)
Radiation Oncology         [phone]) [phone] ________________________________  Name: Penny Howard MRN: [id number]  Date: 06/11/2013  DOB: Jul 04, 1945  KT:GYBWL, Lynelle Smoke, MD  Hoxworth, Darene Lamer, MD     REFERRING PHYSICIAN: Excell Seltzer Darene Lamer, MD   DIAGNOSIS: The encounter diagnosis was Breast cancer of upper-outer quadrant of right female breast.   HISTORY OF PRESENT ILLNESS::Penny Howard is a 68 y.o. female who is seen for an initial consultation visit. The patient is seen today in multidisciplinary breast clinic. She indicates that she felt a palpable mass within the right breast several weeks ago. She has not had any history of breast issues in the past. She was referred to the breast Center for further evaluation. A diagnostic mammogram showed an area of increased density within the right breast centrally and an ultrasound was performed. This confirmed a heterogeneous irregular mass in the retroareolar region measuring 4 cm. No signs of right-sided lymphadenopathy on ultrasound of the right axilla.   An ultrasound-guided biopsy was performed. This revealed invasive mammary carcinoma. Receptor studies have indicated that the tumor is ER positive and PR positive. The Ki-67 was 80%. The HER-2/neu status will be repeated.  The patient underwent an MRI scan of the breasts bilaterally. This revealed a 4.6 cm area of abnormal enhancement within the central portion of the right breast. This finding was consistent with the known malignancy. Associated retraction of the right nipple was noted. The left rest was negative. No abnormal appearing lymph nodes were present.   PREVIOUS RADIATION THERAPY: No   PAST MEDICAL HISTORY:  has a past medical history of Hypertension; GERD (gastroesophageal reflux disease); High cholesterol; and Anxiety.     PAST SURGICAL HISTORY: Past Surgical History  Procedure Laterality Date  . Abdominal hysterectomy    . Bladder surgery       FAMILY HISTORY:  family history includes Breast cancer in her mother and paternal aunt; Lymphoma in her cousin.   SOCIAL HISTORY:  reports that she has never smoked. She does not have any smokeless tobacco history on file. She reports that she drinks about 3 ounces of alcohol per week. She reports that she does not use illicit drugs.   ALLERGIES: Review of patient's allergies indicates not on file.   MEDICATIONS:  No current outpatient prescriptions on file.   No current facility-administered medications for this encounter.     REVIEW OF SYSTEMS:  A 15 point review of systems is documented in the electronic medical record. This was obtained by the nursing staff. However, I reviewed this with the patient to discuss relevant findings and make appropriate changes.  Pertinent items are noted in HPI.    PHYSICAL EXAM:  vitals were not taken for this visit.  ECOG = 0  0 - Asymptomatic (Fully active, able to carry on all predisease activities without restriction)  1 - Symptomatic but completely ambulatory (Restricted in physically strenuous activity but ambulatory and able to carry out work of a light or sedentary nature. For example, light housework, office work)  2 - Symptomatic, <50% in bed during the day (Ambulatory and capable of all self care but unable to carry out any work activities. Up and about more than 50% of waking hours)  3 - Symptomatic, >50% in bed, but not bedbound (Capable of only limited self-care, confined to bed or chair 50% or more of waking hours)  4 - Bedbound (Completely disabled. Cannot carry on any self-care. Totally confined to bed or chair)  5 - Death   Eustace Pen MM, Creech RH, Tormey DC, et al. [phone]). "Toxicity and response criteria of the [facility]". [place] Oncol. 5 (6): 649-55  General: Well-developed, in no acute distress HEENT: Normocephalic, atraumatic; oral cavity clear Neck: Supple without any lymphadenopathy Cardiovascular: Regular rate  and rhythm Respiratory: Clear to auscultation bilaterally Breasts:  Bruising is present in the right breast. She is status post biopsy. Nipple inversion noted. Postbiopsy changes/mass is present centrally within the breast superior to the nipple. This is mobile. No axillary adenopathy present. Left breast exam negative GI: Soft, nontender, normal bowel sounds Extremities: No edema present Neuro: No focal deficits     LABORATORY DATA:  Lab Results  Component Value Date   WBC 8.9 06/11/2013   HGB 12.6 06/11/2013   HCT 38.3 06/11/2013   MCV 94.9 06/11/2013   PLT 354 06/11/2013   Lab Results  Component Value Date   NA 143 06/11/2013   K 4.0 06/11/2013   CO2 19* 06/11/2013   Lab Results  Component Value Date   ALT 25 06/11/2013   AST 24 06/11/2013   ALKPHOS 64 06/11/2013   BILITOT 0.26 06/11/2013      RADIOGRAPHY: Mr Breast Bilateral W Wo Contrast  06/09/2013   CLINICAL DATA:  Recent diagnosis of invasive mammary carcinoma, 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  LABS:  BUN and creatinine were obtained on site at [facility] at  [address]  Results:  BUN 13 mg/dL,  Creatinine 0.6 mg/dL.  EXAM: BILATERAL BREAST MRI WITH AND WITHOUT CONTRAST  TECHNIQUE: Multiplanar, multisequence MR images of both breasts were obtained prior to and following the intravenous administration of 17m of MultiHance.  THREE-DIMENSIONAL MR IMAGE RENDERING ON INDEPENDENT WORKSTATION:  Three-dimensional MR images were rendered by post-processing of the original MR data on an independent workstation. The three-dimensional MR images were interpreted, and findings are reported in the following complete MRI report for this study. Three dimensional images were evaluated at the independent DynaCad workstation  COMPARISON:  Mammogram from [facility] 06/03/2013 and earlier   FINDINGS: Breast composition: c:  Heterogeneous fibroglandular tissue  Background parenchymal enhancement: Minimal  Right breast: No mass or abnormal enhancement.  Left breast: There is right nipple retraction without nipple enhancement. Multiple nodules of enhancement are identified within the central portion of the right breast. Measured together, these are 4.6 x 3.3 x 3.7 cm. Enhancement is medium washout type kinetics. The largest discrete mass is 2.7 cm. Tissue marker clip is identified in the region of enhancement, consistent with recent ultrasound-guided core biopsy with showed malignancy. There is no pectoralis muscle involvement.  Lymph nodes: No abnormal appearing lymph nodes.  Ancillary findings:  None.  IMPRESSION: 1. 4.6 cm of abnormal enhancement in the central portion of the right breast. Findings are consistent with known malignancy. 2. Associated retraction of the right nipple without evidence for direct involvement of the nipple. 3. Left breast is negative.  RECOMMENDATION: Treatment plan  BI-RADS CATEGORY  6: Known biopsy-proven malignancy.   Electronically Signed   By: BShon HaleM.D.   On: 06/09/2013 13:27   Mm Digital Diagnostic Unilat L  06/03/2013   CLINICAL DATA:  Recently diagnosed right breast invasive mammary carcinoma. Pre MRI left breast mammogram requested.  EXAM: DIGITAL DIAGNOSTIC  left breast MAMMOGRAM WITH CAD  COMPARISON:  Previous examinations dated  10/23/2012, 08/31/2011, 07/14/2010, 04/20/2009.  ACR Breast Density Category c: The breast tissue is heterogeneously dense, which may obscure small masses.  FINDINGS: There is no mass, distortion, or worrisome calcification within the left breast.  Mammographic images were processed with CAD.  IMPRESSION: Stable left breast parenchymal pattern. No findings worrisome for malignancy within the left breast.  RECOMMENDATION: Preoperative breast MRI. Treatment plan for known malignancy within the right breast.  I have discussed the  findings and recommendations with the patient. Results were also provided in writing at the conclusion of the visit. If applicable, a reminder letter will be sent to the patient regarding the next appointment.  BI-RADS CATEGORY  1: Negative.   Electronically Signed   By: Luberta Robertson M.D.   On: 06/03/2013 16:04   Mm Digital Diagnostic Unilat R  06/02/2013   CLINICAL DATA:  Post right breast ultrasound-guided biopsy.  EXAM: POST-BIOPSY CLIP PLACEMENT right breast DIAGNOSTIC MAMMOGRAM  COMPARISON:  Previous exams.  FINDINGS: Films are performed following ultrasound guided biopsy of the mass located within the right breast at 12 o'clock position. The ribbon shaped clip is in appropriate position.  IMPRESSION: Appropriate position of clip following right breast ultrasound-guided core biopsy.  Final Assessment: Post Procedure Mammograms for Marker Placement   Electronically Signed   By: Luberta Robertson M.D.   On: 06/02/2013 15:49   Mm Digital Diagnostic Unilat R  06/02/2013   CLINICAL DATA:  Palpable right breast mass. Family history of breast cancer in her sister at age 56.  EXAM: DIGITAL DIAGNOSTIC  right breast MAMMOGRAM WITH CAD  ULTRASOUND right BREAST  COMPARISON:  10/23/2012, 08/31/2011, 07/14/2010, 04/20/2009.  ACR Breast Density Category c: The breast tissue is heterogeneously dense, which may obscure small masses.  FINDINGS: There is an area of focal increased density seen within the superior subareolar portion of the right breast located at the 12 o'clock position.  Mammographic images were processed with CAD.  On physical exam, there is a firm palpable mass located within the right breast at 12 o'clock position 4 cm from the nipple which by physical examination measures approximately 5 cm in size. There is no palpable right axillary adenopathy.  Ultrasound is performed, showing a heterogeneous, irregular mass located within the right breast at 12 o'clock position 4 cm from the nipple measuring 4.0 x  3.7 x 2.7 cm in size. This is associated with increased vascularity on color-flow imaging. This is worrisome for invasive mammary carcinoma.  Ultrasound of the right axilla demonstrates normal axillary contents and no evidence for adenopathy.  I have discussed ultrasound-guided core biopsy of the right breast mass located at 12 o'clock position. The patient would like to proceed with this at this time. This will be performed and reported separately.  IMPRESSION: 4 cm suspicious mass located within the right breast at 12 o'clock position as discussed above. Tissue sampling is recommended and ultrasound-guided core biopsy will be performed and reported separately.  RECOMMENDATION: Right breast ultrasound-guided core biopsy.  I have discussed the findings and recommendations with the patient. Results were also provided in writing at the conclusion of the visit. If applicable, a reminder letter will be sent to the patient regarding the next appointment.  BI-RADS CATEGORY  4: Suspicious abnormality - biopsy should be considered.   Electronically Signed   By: Luberta Robertson M.D.   On: 06/02/2013 15:29   Mm Radiologist Eval And Mgmt  06/03/2013   EXAM: ESTABLISHED PATIENT OFFICE VISIT - LEVEL II [phone])  CHIEF COMPLAINT:  The patient returns for results following right breast ultrasound-guided core biopsy.  Current Pain Level: 2  HISTORY OF PRESENT ILLNESS: Patient noted a palpable mass within the right breast. Diagnostic mammography and ultrasound demonstrated a mass located within the right breast at 12 o'clock position and right breast ultrasound-guided core biopsy was performed. There is a family history of breast cancer in her mother at age 51.  EXAM: There is moderate ecchymosis associated with the right breast ultrasound-guided core biopsy site. There is no hematoma formation and there are no signs of infection. Patient is mildly tender.  PATHOLOGY: The pathology demonstrated invasive mammary carcinoma felt to  most likely be a lobular carcinoma - grade 2. The pathology is concordant with the imaging findings.  ASSESSMENT AND PLAN: ASSESSMENT AND PLAN The patient is referred to the breast cancer multi disciplinary clinic on 06/11/2013. Breast MRI is scheduled for 06/09/2013. Post biopsy wound care instructions were reviewed with the patient. The patient was asked to contact the [facility] for additional questions or concerns.   Electronically Signed   By: Luberta Robertson M.D.   On: 06/03/2013 16:12   US Breast Ltd Uni Right Inc Axilla  06/02/2013   CLINICAL DATA:  Palpable right breast mass. Family history of breast cancer in her sister at age 73.  EXAM: DIGITAL DIAGNOSTIC  right breast MAMMOGRAM WITH CAD  ULTRASOUND right BREAST  COMPARISON:  10/23/2012, 08/31/2011, 07/14/2010, 04/20/2009.  ACR Breast Density Category c: The breast tissue is heterogeneously dense, which may obscure small masses.  FINDINGS: There is an area of focal increased density seen within the superior subareolar portion of the right breast located at the 12 o'clock position.  Mammographic images were processed with CAD.  On physical exam, there is a firm palpable mass located within the right breast at 12 o'clock position 4 cm from the nipple which by physical examination measures approximately 5 cm in size. There is no palpable right axillary adenopathy.  Ultrasound is performed, showing a heterogeneous, irregular mass located within the right breast at 12 o'clock position 4 cm from the nipple measuring 4.0 x 3.7 x 2.7 cm in size. This is associated with increased vascularity on color-flow imaging. This is worrisome for invasive mammary carcinoma.  Ultrasound of the right axilla demonstrates normal axillary contents and no evidence for adenopathy.  I have discussed ultrasound-guided core biopsy of the right breast mass located at 12 o'clock position. The patient would like to proceed with this at this time. This will be performed and reported  separately.  IMPRESSION: 4 cm suspicious mass located within the right breast at 12 o'clock position as discussed above. Tissue sampling is recommended and ultrasound-guided core biopsy will be performed and reported separately.  RECOMMENDATION: Right breast ultrasound-guided core biopsy.  I have discussed the findings and recommendations with the patient. Results were also provided in writing at the conclusion of the visit. If applicable, a reminder letter will be sent to the patient regarding the next appointment.  BI-RADS CATEGORY  4: Suspicious abnormality - biopsy should be considered.   Electronically Signed   By: Luberta Robertson M.D.   On: 06/02/2013 15:29   [place] Rt Breast Bx W Loc Dev 1st Lesion Img Bx Spec US Guide  06/02/2013   CLINICAL DATA:  Palpable right breast mass.  EXAM: ULTRASOUND GUIDED right BREAST CORE NEEDLE BIOPSY  COMPARISON:  Previous exams.  FINDINGS: I met with the patient and we discussed the procedure of fultrasound-guided biopsy, including benefits and alternatives.  We discussed the high likelihood of a successful procedure. We discussed the risks of the procedure, including infection, bleeding, tissue injury, clip migration, and inadequate sampling. Informed written consent was given. The usual time-out protocol was performed immediately prior to the procedure.  Using sterile technique and 2% Lidocaine as local anesthetic, under direct ultrasound visualization, a 14 gauge spring-loaded device was used to perform biopsy of the mass located within the right breast at 12 o'clock position using a medial approach. At the conclusion of the procedure a ribbon shaped tissue marker clip was deployed into the biopsy cavity. Follow up 2 view mammogram was performed and dictated separately.  IMPRESSION: Ultrasound guided biopsy of the right breast mass located at the 12 o'clock position as discussed above. No apparent complications.   Electronically Signed   By: Luberta Robertson M.D.   On:  06/02/2013 15:30       IMPRESSION: The patient is a pleasant 68 year old female with a recent diagnosis of invasive mammary carcinoma of the right breast. The histology suggests possible lobular phenotype. Clinically this represents a T2, N0, M0 tumor. The patient's case was discussed in conference as well as in multidisciplinary clinic this afternoon. Due to the size and location of the tumor, surgical options were reviewed with the patient with a feeling that mastectomy would be most appropriate for her. She indicates that she would like to proceed with this treatment strategy.  I reviewed with the patient the potential role for radiation treatment in the setting of breast cancer. We specifically discussed the possible role for postmastectomy radiation treatment. Given her current presentation, there is not a clear definite role for postmastectomy radiation. However, we discussed that the patient's surgical results/pathology would determine the final recommendation. It is possible therefore that the patient would be criteria for postoperative radiation. The patient has findings on MRI scan which could potentially be upgraded to a T3 tumor on final pathology. The largest discrete mass however was 2.7 cm. The patient is also aware that nodal status can also impact the recommendation for radiation treatment.  We also discussed issues related to reconstruction, specifically the potential strategies in terms of timing of reconstruction. We discussed how this is impacted by radiation treatment. The patient after this discussion indicates that she is strongly motivated to proceed with immediate reconstruction and expressed an understanding of the issues involved.  The patient also has seen the medical oncology today and discussed systemic treatment. An Oncotype test will be ordered.   PLAN: The patient will proceed with surgery and the recommendations for systemic treatment and radiation will be clarified  after additional information including pathology and Oncotype test. I will look forward to reviewing the patient's pathology to determine whether the patient requires further discussion of postmastectomy radiation treatment. Again, on the basis of her current clinical presentation there is not a clear definite role for radiation if she proceeds with mastectomy.       ________________________________   Jodelle Gross, MD, PhD

## 2013-06-19 ENCOUNTER — Telehealth (INDEPENDENT_AMBULATORY_CARE_PROVIDER_SITE_OTHER): Payer: Self-pay

## 2013-06-19 NOTE — Telephone Encounter (Signed)
Called patient and left message to call our office RE:  post op appointment date & time for 07/10/13 @ 11:40 am w/Dr. Excell Seltzer.

## 2013-06-19 NOTE — Telephone Encounter (Signed)
Patient is scheduled for surgery on 07/04/13.  Not sure if patient's aware of her surgery date but, she is scheduled.

## 2013-06-19 NOTE — Telephone Encounter (Signed)
Message copied by Ivor Costa on Thu Jun 19, 2013  4:40 PM ------      Message from: Over[place] Mam      Created: Wed Jun 18, 2013  3:02 PM      Regarding: Follow up appointment       She is schedule on 15 May for surgery but still waiting on the orders for the nuc med injection but go ahead and see what you can do for the follow up.            I told her you would call with this when you had one.            Thanks      ----- Message -----         From: Ivor Costa, CMA         Sent: 06/16/2013   3:32 PM           To: [place] Mam      Subject: RE: I have printed this off                              [place] have her surgery scheduling slip and plan to schedule patient and call with a date.            Jarelis Ehlert      ----- Message -----         From: [place] Mam         Sent: 06/16/2013   2:31 PM           To: Rudene Christians, Ivor Costa, CMA, #      Subject: I have printed this off                                              ----- Message -----         From: Edward Jolly, MD         Sent: 06/16/2013   2:22 PM           To: Rudene Christians, [facility]            Surgeon: Edward Jolly            Patient: Penny Howard   DOB: Aug 16, 1945   MRN: [id number]            Location: [facility]            Admit status: overnight observation            Procedure: right total mastectomy with right axillary sentinel lymph node biopsy            Diagnosis:cancer of the right breast            Size of lesion:    SQ?   Subfascial?                  Position:supine            Anesthesia:General            Assistant?:if available            Estimated time: 1.5 hours            May patient be same day labs: Yes  Mark any special requests:      NL/SM?:                 Films at:      NeoProbe?: yes             C-Arm?:       Mesh?:      Harmonic Scalpel?:      Yellow Fin Stirrups?:      CC Clearance?:      Co-ordinate  case?:         Miscellaneous Notes: please call the patient to schedule. Please call today if possible. Patient cannot wait until end of May. Please schedule during LDOW week of May 11 at [facility] , preferably not Monday.            Darene Lamer Hoxworth      06/16/2013      2:22 PM                               ------

## 2013-06-23 ENCOUNTER — Other Ambulatory Visit (INDEPENDENT_AMBULATORY_CARE_PROVIDER_SITE_OTHER): Payer: Self-pay | Admitting: General Surgery

## 2013-06-24 ENCOUNTER — Encounter (HOSPITAL_COMMUNITY): Payer: Self-pay | Admitting: Pharmacy Technician

## 2013-06-25 ENCOUNTER — Other Ambulatory Visit (INDEPENDENT_AMBULATORY_CARE_PROVIDER_SITE_OTHER): Payer: Self-pay | Admitting: General Surgery

## 2013-06-25 DIAGNOSIS — C50911 Malignant neoplasm of unspecified site of right female breast: Secondary | ICD-10-CM

## 2013-06-26 ENCOUNTER — Telehealth (INDEPENDENT_AMBULATORY_CARE_PROVIDER_SITE_OTHER): Payer: Self-pay | Admitting: *Deleted

## 2013-06-26 NOTE — Telephone Encounter (Signed)
Pt left a message on my voicemail stating she is having surgery on 07/04/13 and she needs Dr. Excell Seltzer to send orders to [facility] for Surgical Prothesis?  Please advise!  Anderson Malta

## 2013-06-27 ENCOUNTER — Encounter (HOSPITAL_COMMUNITY): Payer: Self-pay

## 2013-06-27 ENCOUNTER — Ambulatory Visit (HOSPITAL_COMMUNITY)
Admission: RE | Admit: 2013-06-27 | Discharge: 2013-06-27 | Disposition: A | Discharge status: Home / Self Care | Payer: Medicare Other | Source: Ambulatory Visit | Attending: Anesthesiology | Admitting: Anesthesiology

## 2013-06-27 ENCOUNTER — Encounter (HOSPITAL_COMMUNITY)
Admission: RE | Admit: 2013-06-27 | Discharge: 2013-06-27 | Disposition: A | Discharge status: Home / Self Care | Payer: Medicare Other | Source: Ambulatory Visit | Attending: General Surgery | Admitting: General Surgery

## 2013-06-27 DIAGNOSIS — C50919 Malignant neoplasm of unspecified site of unspecified female breast: Secondary | ICD-10-CM | POA: Diagnosis not present

## 2013-06-27 DIAGNOSIS — Z01812 Encounter for preprocedural laboratory examination: Secondary | ICD-10-CM | POA: Insufficient documentation

## 2013-06-27 DIAGNOSIS — Z01810 Encounter for preprocedural cardiovascular examination: Secondary | ICD-10-CM | POA: Insufficient documentation

## 2013-06-27 DIAGNOSIS — Z01818 Encounter for other preprocedural examination: Secondary | ICD-10-CM | POA: Diagnosis not present

## 2013-06-27 HISTORY — DX: Malignant (primary) neoplasm, unspecified: C80.1

## 2013-06-27 LAB — BASIC METABOLIC PANEL
BUN: 13 mg/dL (ref 6–23)
CALCIUM: 9.6 mg/dL (ref 8.4–10.5)
CO2: 22 meq/L (ref 19–32)
CREATININE: 0.65 mg/dL (ref 0.50–1.10)
Chloride: 105 mEq/L (ref 96–112)
GFR calc Af Amer: >90 mL/min (ref >90)
GFR calc non Af Amer: >90 mL/min (ref >90)
Glucose, Bld: 127 mg/dL — ABNORMAL HIGH (ref 70–99)
Potassium: 4.1 mEq/L (ref 3.7–5.3)
Sodium: 140 mEq/L (ref 137–147)

## 2013-06-27 LAB — CBC
HCT: 38.4 % (ref 36.0–46.0)
Hemoglobin: 12.5 g/dL (ref 12.0–15.0)
MCH: 31.3 pg (ref 26.0–34.0)
MCHC: 32.6 g/dL (ref 30.0–36.0)
MCV: 96 fL (ref 78.0–100.0)
Platelets: 309 10*3/uL (ref 150–400)
RBC: 4 MIL/uL (ref 3.87–5.11)
RDW: 12.6 % (ref 11.5–15.5)
WBC: 6.2 10*3/uL (ref 4.0–10.5)

## 2013-06-27 NOTE — Progress Notes (Signed)
req'd notes ,ekg from pcp dr Lynelle Smoke spear

## 2013-06-27 NOTE — Pre-Procedure Instructions (Addendum)
[name]  06/27/2013   Your procedure is scheduled on:  07/04/13  [facility] short [facility] admitting at 530 AM.  Call this number if you have problems the morning of surgery: [phone]   Remember:   Do not eat food or drink liquids after midnight.   Take these medicines the morning of surgery with A SIP OF WATER: lexapro, nexium     Take all meds as ordered until day of surgery except as instructed below or per dr       Bridgette Habermann all herbel meds, nsaids (aleve,naproxen,advil,ibuprofen) 5 days prior to surgery including aspirin, vitamins,fish oil, red yeast rice extract   Do not wear jewelry, make-up or nail polish.  Do not wear lotions, powders, or perfumes. You may wear deodorant.  Do not shave 48 hours prior to surgery. Men may shave face and neck.  Do not bring valuables to the hospital.  [facility] is not responsible                  for any belongings or valuables.               Contacts, dentures or bridgework may not be worn into surgery.  Leave suitcase in the car. After surgery it may be brought to your room.  For patients admitted to the hospital, discharge time is determined by your                treatment team.               Patients discharged the day of surgery will not be allowed to drive  home.  Name and phone number of your driver:   Special Instructions:  Special Instructions: [place] - Preparing for Surgery  Before surgery, you can play an important role.  Because skin is not sterile, your skin needs to be as free of germs as possible.  You can reduce the number of germs on you skin by washing with CHG (chlorahexidine gluconate) soap before surgery.  CHG is an antiseptic cleaner which kills germs and bonds with the skin to continue killing germs even after washing.  Please DO NOT use if you have an allergy to CHG or antibacterial soaps.  If your skin becomes reddened/irritated stop using the CHG and inform your nurse when you arrive at Short  Stay.  Do not shave (including legs and underarms) for at least 48 hours prior to the first CHG shower.  You may shave your face.  Please follow these instructions carefully:   1.  Shower with CHG Soap the night before surgery and the morning of Surgery.  2.  If you choose to wash your hair, wash your hair first as usual with your normal shampoo.  3.  After you shampoo, rinse your hair and body thoroughly to remove the Shampoo.  4.  Use CHG as you would any other liquid soap.  You can apply chg directly  to the skin and wash gently with scrungie or a clean washcloth.  5.  Apply the CHG Soap to your body ONLY FROM THE NECK DOWN.  Do not use on open wounds or open sores.  Avoid contact with your eyes ears, mouth and genitals (private parts).  Wash genitals (private parts)       with your normal soap.  6.  Wash thoroughly, paying special attention to the area where your surgery will be performed.  7.  Thoroughly rinse your body with warm  water from the neck down.  8.  DO NOT shower/wash with your normal soap after using and rinsing off the CHG Soap.  9.  Pat yourself dry with a clean towel.            10.  Wear clean pajamas.            11.  Place clean sheets on your bed the night of your first shower and do not sleep with pets.  Day of Surgery  Do not apply any lotions/deodorants the morning of surgery.  Please wear clean clothes to the hospital/surgery center.   Please read over the following fact sheets that you were given: Pain Booklet, Coughing and Deep Breathing and Surgical Site Infection Prevention

## 2013-06-30 ENCOUNTER — Encounter (HOSPITAL_COMMUNITY): Payer: Self-pay

## 2013-06-30 NOTE — Progress Notes (Signed)
Anesthesia chart review: Patient is a 68 year old female scheduled for right total mastectomy with axillary sentinel lymph node biopsy and possible axillary dissection on 07/04/13 by Dr. Excell Seltzer.  History includes non-smoker, HTN, GERD, hypercholesterolemia, anxiety, right breast cancer, bladder surgery. PCP is Dr. Florina Ou in [place].  Preoperative CXR and labs noted.  EKG on 06/27/13 showed NSR, cannot rule out anterior infarct (age undetermined). Poor r wave progression from V2-3 (low r wave in V3) is new since 05/07/07, otherwise not significantly changed.  No CV symptoms documented at her PAT visit.  No known MI/CHF or DM history.  Further evaluation by her assigned anesthesiologist on the day of surgery, but if no acute changes or new CV symptomology then I would anticipate that she could proceed as planned.  George Hugh [facility] Short Stay Center/Anesthesiology Phone [phone] 06/30/2013 1:19 PM

## 2013-06-30 NOTE — Telephone Encounter (Signed)
OK, can someone fax or call an order please?

## 2013-07-03 ENCOUNTER — Other Ambulatory Visit (INDEPENDENT_AMBULATORY_CARE_PROVIDER_SITE_OTHER): Payer: Self-pay

## 2013-07-03 MED ORDER — CEFAZOLIN SODIUM-DEXTROSE 2-3 GM-% IV SOLR
2.0000 g | INTRAVENOUS | Status: AC
  Administered 2013-07-04: 2 g via INTRAVENOUS
  Filled 2013-07-03: qty 50

## 2013-07-03 MED ORDER — UNABLE TO FIND: Status: DC

## 2013-07-04 ENCOUNTER — Encounter (HOSPITAL_COMMUNITY)
Admission: RE | Disposition: A | Discharge status: Home / Self Care | Payer: Self-pay | Source: Ambulatory Visit | Attending: General Surgery

## 2013-07-04 ENCOUNTER — Encounter (HOSPITAL_COMMUNITY): Payer: Self-pay | Admitting: *Deleted

## 2013-07-04 ENCOUNTER — Observation Stay (HOSPITAL_COMMUNITY)
Admission: RE | Admit: 2013-07-04 | Discharge: 2013-07-05 | Disposition: A | Discharge status: Home / Self Care | Payer: Medicare Other | Source: Ambulatory Visit | Attending: General Surgery | Admitting: General Surgery

## 2013-07-04 ENCOUNTER — Encounter (HOSPITAL_COMMUNITY)
Admission: RE | Admit: 2013-07-04 | Discharge: 2013-07-04 | Disposition: A | Discharge status: Home / Self Care | Payer: Medicare Other | Source: Ambulatory Visit | Attending: General Surgery | Admitting: General Surgery

## 2013-07-04 ENCOUNTER — Encounter (HOSPITAL_COMMUNITY): Payer: Medicare Other | Admitting: Vascular Surgery

## 2013-07-04 ENCOUNTER — Ambulatory Visit (HOSPITAL_COMMUNITY): Payer: Medicare Other | Admitting: Anesthesiology

## 2013-07-04 DIAGNOSIS — Z79899 Other long term (current) drug therapy: Secondary | ICD-10-CM | POA: Insufficient documentation

## 2013-07-04 DIAGNOSIS — E7800 Pure hypercholesterolemia, unspecified: Secondary | ICD-10-CM | POA: Insufficient documentation

## 2013-07-04 DIAGNOSIS — Z170 Estrogen receptor positive status [ER+]: Secondary | ICD-10-CM | POA: Diagnosis not present

## 2013-07-04 DIAGNOSIS — Z780 Asymptomatic menopausal state: Secondary | ICD-10-CM | POA: Diagnosis not present

## 2013-07-04 DIAGNOSIS — C50411 Malignant neoplasm of upper-outer quadrant of right female breast: Secondary | ICD-10-CM

## 2013-07-04 DIAGNOSIS — C50919 Malignant neoplasm of unspecified site of unspecified female breast: Secondary | ICD-10-CM | POA: Diagnosis present

## 2013-07-04 DIAGNOSIS — C50119 Malignant neoplasm of central portion of unspecified female breast: Principal | ICD-10-CM | POA: Insufficient documentation

## 2013-07-04 DIAGNOSIS — I10 Essential (primary) hypertension: Secondary | ICD-10-CM | POA: Diagnosis not present

## 2013-07-04 DIAGNOSIS — F411 Generalized anxiety disorder: Secondary | ICD-10-CM | POA: Diagnosis not present

## 2013-07-04 DIAGNOSIS — C50911 Malignant neoplasm of unspecified site of right female breast: Secondary | ICD-10-CM

## 2013-07-04 DIAGNOSIS — K219 Gastro-esophageal reflux disease without esophagitis: Secondary | ICD-10-CM | POA: Insufficient documentation

## 2013-07-04 HISTORY — PX: MASTECTOMY: SHX3

## 2013-07-04 HISTORY — PX: SIMPLE MASTECTOMY WITH AXILLARY SENTINEL NODE BIOPSY: SHX6098

## 2013-07-04 HISTORY — PX: MASTECTOMY W/ SENTINEL NODE BIOPSY: SHX2001

## 2013-07-04 HISTORY — DX: Stress incontinence (female) (male): N39.3

## 2013-07-04 SURGERY — SIMPLE MASTECTOMY WITH AXILLARY SENTINEL NODE BIOPSY
Anesthesia: General | Site: Breast | Laterality: Right

## 2013-07-04 MED ORDER — SODIUM CHLORIDE 0.9 % IJ SOLN
INTRAMUSCULAR | Status: AC
  Filled 2013-07-04: qty 10

## 2013-07-04 MED ORDER — OXYCODONE HCL 5 MG PO TABS
5.0000 mg | ORAL_TABLET | Freq: Once | ORAL | Status: AC | PRN
Start: 2013-07-04 — End: 2013-07-04
  Administered 2013-07-04: 5 mg via ORAL

## 2013-07-04 MED ORDER — TECHNETIUM TC 99M SULFUR COLLOID FILTERED
1.0000 | Freq: Once | INTRAVENOUS | Status: AC | PRN
  Administered 2013-07-04: 1 via INTRADERMAL

## 2013-07-04 MED ORDER — ONDANSETRON HCL 4 MG/2ML IJ SOLN: 4.0000 mg | Freq: Four times a day (QID) | INTRAMUSCULAR | Status: DC | PRN

## 2013-07-04 MED ORDER — HEPARIN SODIUM (PORCINE) 5000 UNIT/ML IJ SOLN
5000.0000 [IU] | Freq: Three times a day (TID) | INTRAMUSCULAR | Status: DC
  Administered 2013-07-04 – 2013-07-05 (×2): 5000 [IU] via SUBCUTANEOUS
  Filled 2013-07-04 (×3): qty 1

## 2013-07-04 MED ORDER — FENTANYL CITRATE 0.05 MG/ML IJ SOLN
INTRAMUSCULAR | Status: AC
  Filled 2013-07-04: qty 5

## 2013-07-04 MED ORDER — SODIUM CHLORIDE 0.9 % IJ SOLN
INTRAMUSCULAR | Status: DC | PRN
  Administered 2013-07-04: 07:00:00

## 2013-07-04 MED ORDER — MIDAZOLAM HCL 2 MG/2ML IJ SOLN
INTRAMUSCULAR | Status: AC
  Filled 2013-07-04: qty 2

## 2013-07-04 MED ORDER — MORPHINE SULFATE 2 MG/ML IJ SOLN
2.0000 mg | INTRAMUSCULAR | Status: DC | PRN
Start: 2013-07-04 — End: 2013-07-05
  Administered 2013-07-04: 4 mg via INTRAVENOUS
  Filled 2013-07-04: qty 2

## 2013-07-04 MED ORDER — HYDROMORPHONE HCL PF 1 MG/ML IJ SOLN
INTRAMUSCULAR | Status: AC
  Filled 2013-07-04: qty 1

## 2013-07-04 MED ORDER — OXYCODONE HCL 5 MG/5ML PO SOLN: 5.0000 mg | Freq: Once | ORAL | Status: AC | PRN

## 2013-07-04 MED ORDER — METHYLENE BLUE 1 % INJ SOLN
INTRAMUSCULAR | Status: AC
  Filled 2013-07-04: qty 10

## 2013-07-04 MED ORDER — PROPOFOL 10 MG/ML IV BOLUS
INTRAVENOUS | Status: AC
  Filled 2013-07-04: qty 20

## 2013-07-04 MED ORDER — DEXAMETHASONE SODIUM PHOSPHATE 10 MG/ML IJ SOLN
INTRAMUSCULAR | Status: DC | PRN
  Administered 2013-07-04: 10 mg via INTRAVENOUS

## 2013-07-04 MED ORDER — OXYCODONE HCL 5 MG PO TABS
ORAL_TABLET | ORAL | Status: AC
  Filled 2013-07-04: qty 1

## 2013-07-04 MED ORDER — LIDOCAINE HCL (CARDIAC) 20 MG/ML IV SOLN
INTRAVENOUS | Status: DC | PRN
  Administered 2013-07-04: 100 mg via INTRAVENOUS

## 2013-07-04 MED ORDER — ONDANSETRON HCL 4 MG/2ML IJ SOLN
INTRAMUSCULAR | Status: DC | PRN
  Administered 2013-07-04: 4 mg via INTRAVENOUS

## 2013-07-04 MED ORDER — PROPOFOL 10 MG/ML IV BOLUS
INTRAVENOUS | Status: DC | PRN
  Administered 2013-07-04: 40 mg via INTRAVENOUS
  Administered 2013-07-04: 160 mg via INTRAVENOUS

## 2013-07-04 MED ORDER — HYDROMORPHONE HCL PF 1 MG/ML IJ SOLN
0.2500 mg | INTRAMUSCULAR | Status: DC | PRN
  Administered 2013-07-04: 0.25 mg via INTRAVENOUS
  Administered 2013-07-04 (×2): 0.5 mg via INTRAVENOUS
  Administered 2013-07-04: 0.25 mg via INTRAVENOUS
  Administered 2013-07-04: 0.5 mg via INTRAVENOUS

## 2013-07-04 MED ORDER — PROMETHAZINE HCL 25 MG/ML IJ SOLN: 6.2500 mg | INTRAMUSCULAR | Status: DC | PRN

## 2013-07-04 MED ORDER — MIDAZOLAM HCL 5 MG/5ML IJ SOLN
INTRAMUSCULAR | Status: DC | PRN
  Administered 2013-07-04 (×2): 2 mg via INTRAVENOUS

## 2013-07-04 MED ORDER — FENTANYL CITRATE 0.05 MG/ML IJ SOLN
INTRAMUSCULAR | Status: DC | PRN
  Administered 2013-07-04 (×3): 50 ug via INTRAVENOUS
  Administered 2013-07-04: 100 ug via INTRAVENOUS

## 2013-07-04 MED ORDER — DEXTROSE IN LACTATED RINGERS 5 % IV SOLN
INTRAVENOUS | Status: DC
  Administered 2013-07-04 – 2013-07-05 (×2): via INTRAVENOUS

## 2013-07-04 MED ORDER — ONDANSETRON HCL 4 MG PO TABS: 4.0000 mg | ORAL_TABLET | Freq: Four times a day (QID) | ORAL | Status: DC | PRN

## 2013-07-04 MED ORDER — ONDANSETRON HCL 4 MG/2ML IJ SOLN
INTRAMUSCULAR | Status: AC
  Filled 2013-07-04: qty 2

## 2013-07-04 MED ORDER — LISINOPRIL 10 MG PO TABS
10.0000 mg | ORAL_TABLET | Freq: Every day | ORAL | Status: DC
  Administered 2013-07-04 – 2013-07-05 (×2): 10 mg via ORAL
  Filled 2013-07-04 (×2): qty 1

## 2013-07-04 MED ORDER — 0.9 % SODIUM CHLORIDE (POUR BTL) OPTIME
TOPICAL | Status: DC | PRN
  Administered 2013-07-04 (×2): 1000 mL

## 2013-07-04 MED ORDER — DEXAMETHASONE SODIUM PHOSPHATE 10 MG/ML IJ SOLN
INTRAMUSCULAR | Status: AC
  Filled 2013-07-04: qty 1

## 2013-07-04 MED ORDER — LACTATED RINGERS IV SOLN
INTRAVENOUS | Status: DC | PRN
  Administered 2013-07-04 (×2): via INTRAVENOUS

## 2013-07-04 MED ORDER — EPHEDRINE SULFATE 50 MG/ML IJ SOLN
INTRAMUSCULAR | Status: DC | PRN
  Administered 2013-07-04: 10 mg via INTRAVENOUS

## 2013-07-04 MED ORDER — EPHEDRINE SULFATE 50 MG/ML IJ SOLN
INTRAMUSCULAR | Status: AC
  Filled 2013-07-04: qty 1

## 2013-07-04 MED ORDER — LIDOCAINE HCL (CARDIAC) 20 MG/ML IV SOLN
INTRAVENOUS | Status: AC
  Filled 2013-07-04: qty 5

## 2013-07-04 MED ORDER — OXYCODONE-ACETAMINOPHEN 5-325 MG PO TABS
1.0000 | ORAL_TABLET | ORAL | Status: DC | PRN
  Administered 2013-07-04 – 2013-07-05 (×3): 2 via ORAL
  Filled 2013-07-04 (×3): qty 2

## 2013-07-04 MED ORDER — ESCITALOPRAM OXALATE 10 MG PO TABS
10.0000 mg | ORAL_TABLET | Freq: Every day | ORAL | Status: DC
  Administered 2013-07-05: 10 mg via ORAL
  Filled 2013-07-04: qty 1

## 2013-07-04 SURGICAL SUPPLY — 52 items
BINDER BREAST LRG (GAUZE/BANDAGES/DRESSINGS) ×2 IMPLANT
BINDER BREAST XLRG (GAUZE/BANDAGES/DRESSINGS) IMPLANT
CANISTER SUCTION 2500CC (MISCELLANEOUS) ×4 IMPLANT
CHLORAPREP W/TINT 26ML (MISCELLANEOUS) ×2 IMPLANT
CLIP TI MEDIUM 6 (CLIP) ×2 IMPLANT
CONT SPEC 4OZ CLIKSEAL STRL BL (MISCELLANEOUS) ×2 IMPLANT
COVER PROBE W GEL 5X96 (DRAPES) ×2 IMPLANT
COVER SURGICAL LIGHT HANDLE (MISCELLANEOUS) ×2 IMPLANT
DERMABOND ADVANCED (GAUZE/BANDAGES/DRESSINGS) ×1
DERMABOND ADVANCED .7 DNX12 (GAUZE/BANDAGES/DRESSINGS) ×1 IMPLANT
DEVICE DISSECT PLASMABLAD 3.0S (MISCELLANEOUS) ×1 IMPLANT
DRAIN CHANNEL 19F RND (DRAIN) ×2 IMPLANT
DRAPE CHEST BREAST 15X10 FENES (DRAPES) ×2 IMPLANT
DRAPE UTILITY 15X26 W/TAPE STR (DRAPE) ×4 IMPLANT
DRSG PAD ABDOMINAL 8X10 ST (GAUZE/BANDAGES/DRESSINGS) ×2 IMPLANT
ELECT CAUTERY BLADE 6.4 (BLADE) ×2 IMPLANT
ELECT REM PT RETURN 9FT ADLT (ELECTROSURGICAL) ×2
ELECTRODE REM PT RTRN 9FT ADLT (ELECTROSURGICAL) ×1 IMPLANT
EVACUATOR SILICONE 100CC (DRAIN) ×2 IMPLANT
GLOVE BIOGEL PI IND STRL 7.0 (GLOVE) ×1 IMPLANT
GLOVE BIOGEL PI IND STRL 8 (GLOVE) ×2 IMPLANT
GLOVE BIOGEL PI INDICATOR 7.0 (GLOVE) ×1
GLOVE BIOGEL PI INDICATOR 8 (GLOVE) ×2
GLOVE SS BIOGEL STRL SZ 6.5 (GLOVE) ×1 IMPLANT
GLOVE SS BIOGEL STRL SZ 7.5 (GLOVE) ×1 IMPLANT
GLOVE SUPERSENSE BIOGEL SZ 6.5 (GLOVE) ×1
GLOVE SUPERSENSE BIOGEL SZ 7.5 (GLOVE) ×1
GOWN STRL REUS W/ TWL LRG LVL3 (GOWN DISPOSABLE) ×1 IMPLANT
GOWN STRL REUS W/ TWL XL LVL3 (GOWN DISPOSABLE) ×1 IMPLANT
GOWN STRL REUS W/TWL LRG LVL3 (GOWN DISPOSABLE) ×1
GOWN STRL REUS W/TWL XL LVL3 (GOWN DISPOSABLE) ×1
KIT BASIN OR (CUSTOM PROCEDURE TRAY) ×2 IMPLANT
KIT ROOM TURNOVER OR (KITS) ×2 IMPLANT
NEEDLE 18GX1X1/2 (RX/OR ONLY) (NEEDLE) ×2 IMPLANT
NEEDLE HYPO 25GX1X1/2 BEV (NEEDLE) ×2 IMPLANT
NS IRRIG 1000ML POUR BTL (IV SOLUTION) ×2 IMPLANT
PACK GENERAL/GYN (CUSTOM PROCEDURE TRAY) ×2 IMPLANT
PAD ARMBOARD 7.5X6 YLW CONV (MISCELLANEOUS) ×2 IMPLANT
PLASMABLADE 3.0S (MISCELLANEOUS) ×2
SPECIMEN JAR X LARGE (MISCELLANEOUS) ×2 IMPLANT
SPONGE GAUZE 4X4 12PLY STER LF (GAUZE/BANDAGES/DRESSINGS) ×2 IMPLANT
SPONGE LAP 18X18 X RAY DECT (DISPOSABLE) ×2 IMPLANT
SUT ETHILON 2 0 FS 18 (SUTURE) ×2 IMPLANT
SUT MNCRL AB 4-0 PS2 18 (SUTURE) ×2 IMPLANT
SUT MON AB 5-0 PS2 18 (SUTURE) ×2 IMPLANT
SUT VIC AB 3-0 54X BRD REEL (SUTURE) ×1 IMPLANT
SUT VIC AB 3-0 BRD 54 (SUTURE) ×1
SUT VIC AB 3-0 SH 18 (SUTURE) ×4 IMPLANT
SYR CONTROL 10ML LL (SYRINGE) ×2 IMPLANT
TOWEL OR 17X24 6PK STRL BLUE (TOWEL DISPOSABLE) ×2 IMPLANT
TOWEL OR 17X26 10 PK STRL BLUE (TOWEL DISPOSABLE) ×2 IMPLANT
TUBE CONNECTING 12X1/4 (SUCTIONS) ×2 IMPLANT

## 2013-07-04 NOTE — Op Note (Signed)
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 After extensive discussion regarding treatment options detailed elsewhere we elect to proceed with right total mastectomy with right axillary sentinel lymph node biopsy and possible axillary dissection as initial treatment for her cancer. Following injection of 1 mCi of technetium sulfur colloid intradermally around the right nipple in the holding area prior to the procedure she is brought to the operating room    Procedure Detail:  Patient was brought to the operating room, placed in the supine position on the operating table, and general LMA anesthesia induced. Her right arm was positioned extended on an arm board and the entire right chest breast and upper arm are widely sterilely prepped and draped. 5 cc of dilute methylene blue was injected subcutaneously beneath the right nipple after patient time out performed. She received preoperative IV antibiotics and PAS were in place. Using the plasma blade an elliptical incision encompassing the nipple areola complex was used. The skin and subcutaneous flaps were initially raised superiorly up toward the clavicle and laterally out toward the anterior border of the latissimus dorsi which was dissected free. The neoprobe was then used to localize the hot area in the right axilla. Using cautery and blunt dissection a slightly enlarged right blue lymph node with high counts was identified in this and an adjacent lymph node with somewhat lower counts was excised. This was sent  for frozen section and returned as negative for malignancy. The counts of this node were about 1600 with background of the axilla of about 100. The flaps were then completed inferiorly down toward the origin of the rectus and medially to the edge of the sternum. The breast was then reflected off of the pectoralis working medial to lateral. The specimen was dissected away from the lateral edge of the pectoralis major and from the serratus laterally. Attachments along the latissimus were divided with cautery. The clavipectoral fascia was incised and the specimen dissected up to the junction of the tail of Spence and low axilla where I came across with Kelly clamps and tied with 3-0 Vicryl. The specimen was sent for permanent pathology. The wound was thoroughly irrigated and hemostasis assured. A 19 Blake closed suction drain was left through a separate stab wound underneath the flaps. The subcutaneous tissue was closed with 3-0 Vicryl and the skin with subcuticular 4-0 Monocryl and Dermabond. Sponge needle and instrument counts were correct.    Findings: As above  Estimated Blood Loss:  Minimal         Drains: 19 round Blake drain x1  Blood Given: none          Specimens: #1 right axillary sentinel lymph node hot and blue    #2 right total mastectomy        Complications:  * No complications entered in OR log *         Disposition: PACU - hemodynamically stable.         Condition: stable

## 2013-07-04 NOTE — Anesthesia Preprocedure Evaluation (Addendum)
Anesthesia Evaluation  Patient identified by MRN, date of birth, ID band Patient awake    Reviewed: Allergy & Precautions, H&P , NPO status , Patient's Chart, lab work & pertinent test results  History of Anesthesia Complications Negative for: history of anesthetic complications  Airway Mallampati: I TM Distance: >3 FB Neck ROM: Full    Dental  (+) Teeth Intact, Dental Advisory Given   Pulmonary neg pulmonary ROS,  breath sounds clear to auscultation        Cardiovascular hypertension, Pt. on medications Rhythm:Regular Rate:Normal     Neuro/Psych    GI/Hepatic Neg liver ROS, GERD-  Medicated and Controlled,  Endo/Other    Renal/GU negative Renal ROS     Musculoskeletal   Abdominal   Peds  Hematology   Anesthesia Other Findings   Reproductive/Obstetrics                         Anesthesia Physical Anesthesia Plan  ASA: II  Anesthesia Plan: General   Post-op Pain Management:    Induction: Intravenous  Airway Management Planned: Oral ETT and LMA  Additional Equipment:   Intra-op Plan:   Post-operative Plan: Extubation in OR  Informed Consent: I have reviewed the patients History and Physical, chart, labs and discussed the procedure including the risks, benefits and alternatives for the proposed anesthesia with the patient or authorized representative who has indicated his/her understanding and acceptance.   Dental advisory given  Plan Discussed with: CRNA, Surgeon and Anesthesiologist  Anesthesia Plan Comments:        Anesthesia Quick Evaluation

## 2013-07-04 NOTE — Anesthesia Procedure Notes (Signed)
Procedure Name: LMA Insertion Date/Time: 07/04/2013 7:47 AM Performed by: Melina Copa, Tamakia Porto R Pre-anesthesia Checklist: Patient identified, Emergency Drugs available, Suction available, Patient being monitored and Timeout performed Patient Re-evaluated:Patient Re-evaluated prior to inductionOxygen Delivery Method: Circle system utilized Preoxygenation: Pre-oxygenation with 100% oxygen Intubation Type: IV induction Ventilation: Mask ventilation without difficulty LMA: LMA inserted LMA Size: 4.0 Number of attempts: 1 Placement Confirmation: positive ETCO2 and breath sounds checked- equal and bilateral Tube secured with: Tape Dental Injury: Teeth and Oropharynx as per pre-operative assessment and Injury to lip

## 2013-07-04 NOTE — Anesthesia Postprocedure Evaluation (Signed)
  Anesthesia Post-op Note  Patient: Penny Howard  Procedure(s) Performed: Procedure(s): RIGHT TOTAL MASTECTOMY WITH RIGHT  AXILLARY SENTINEL LYMPH NODE BIOPSY (Right)  Patient Location: PACU  Anesthesia Type:General  Level of Consciousness: awake and alert   Airway and Oxygen Therapy: Patient Spontanous Breathing  Post-op Pain: mild  Post-op Assessment: Post-op Vital signs reviewed  Post-op Vital Signs: stable  Last Vitals:  Filed Vitals:   07/04/13 1047  BP: 133/74  Pulse: 117  Temp:   Resp: 16    Complications: No apparent anesthesia complications

## 2013-07-04 NOTE — Transfer of Care (Signed)
Immediate Anesthesia Transfer of Care Note  Patient: Penny Howard  Procedure(s) Performed: Procedure(s): RIGHT TOTAL MASTECTOMY WITH RIGHT  AXILLARY SENTINEL LYMPH NODE BIOPSY (Right)  Patient Location: PACU  Anesthesia Type:General  Level of Consciousness: awake  Airway & Oxygen Therapy: Patient Spontanous Breathing and Patient connected to nasal cannula oxygen  Post-op Assessment: Report given to PACU RN, Post -op Vital signs reviewed and stable and Patient moving all extremities  Post vital signs: Reviewed and stable  Complications: No apparent anesthesia complications

## 2013-07-04 NOTE — Interval H&P Note (Signed)
History and Physical Interval Note:  07/04/2013 7:29 AM  Penny Howard  has presented today for surgery, with the diagnosis of cancer right breast  The various methods of treatment have been discussed with the patient and family. After consideration of risks, benefits and other options for treatment, the patient has consented to  Procedure(s): RIGHT TOTAL MASTECTOMY WITH RIGHT  AXILLARY SENTINEL LYMPH NODE BIOPSY (Right) as a surgical intervention .  The patient's history has been reviewed, patient examined, no change in status, stable for surgery.  I have reviewed the patient's chart and labs.  Questions were answered to the patient's satisfaction.     Darene Lamer Lorane Cousar

## 2013-07-04 NOTE — Progress Notes (Signed)
Md aware of ST. No orders received

## 2013-07-04 NOTE — H&P (View-Only) (Signed)
Chief Complaint: New diagnosis of breast cancer  History:    Penny Howard is a 68 y.o. postmenopausal female referred by Dr. Luberta Robertson  for evaluation of recently diagnosed carcinoma of the right breast. The patient states that she recently noted inversion of her right nipple and then a central right breast mass several weeks ago. She presented to her family physician and was referred to the breast center for further evaluation.  Subsequent imaging included diagnostic mamogram showing an area of focal increased density in the superior and subareolar portion of the right breast 12:00 position and ultrasound showing a heterogeneous irregular mass at the 12:00 position and retroareolar measuring 4 cm. Ultrasound of the right axilla was negative..   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 ssubsequent breast MR has revealed a 4.6 cm area of nodular enhancement in the central and retroareolar right breast.She is seen now in breast multidisciplinary clinic for initial treatment planning.  She has experienced a breast lump on exam and nipple inversion as above.  She does not have a personal history of any previous breast problems.  Findings at that time were the following:  Tumor size: 4.6 cm  Tumor grade: 3  Estrogen Receptor: positive Progesterone Receptor: positive  Her-2 neu: pending  Lymph node status: negative Neurovascular invasion: no mention of biopsy Lymphatic invasion: no mention on biopsy  Past Medical History  Diagnosis Date  . Hypertension   . GERD (gastroesophageal reflux disease)   . High cholesterol   . Anxiety     Past Surgical History  Procedure Laterality Date  . Abdominal hysterectomy    . Bladder surgery      No current outpatient prescriptions on file.   No current facility-administered medications for this visit.    Family History  Problem Relation Age of Onset  . Breast  cancer Mother   . Breast cancer Paternal Aunt   . Lymphoma Cousin     non- hodgkins lymphoma    History   Social History  . Marital Status: Married    Spouse Name: N/A    Number of Children: N/A  . Years of Education: N/A   Social History Main Topics  . Smoking status: Never Smoker   . Smokeless tobacco: Not on file  . Alcohol Use: 3.0 oz/week    5 Glasses of wine per week  . Drug Use: No  . Sexual Activity: Not on file   Other Topics Concern  . Not on file   Social History Narrative  . No narrative on file     Review of Systems Constitutional: negative Respiratory: negative Cardiovascular: negative Gastrointestinal: negative, colonoscopy is up to date     Objective:  There were no vitals taken for this visit.  General: Alert, well-developed Caucasian female, in no distress Skin: Warm and dry without rash or infection. HEENT: No palpable masses or thyromegaly. Sclera nonicteric. Pupils equal round and reactive. Oropharynx clear. Breasts: there is bruising of the right breast post biopsy. Right nipple inversion is noted. There is a fairly large ill-defined mass just beneath and above the nipple. No fixation. No palpable axillary adenopathy bilaterally. Left breast is negative to exam. Lymph nodes: No cervical, supraclavicular, or inguinal nodes palpable. Lungs: Breath sounds clear and equal without increased work of breathing Cardiovascular: Regular rate and rhythm without murmur. No JVD or edema. Peripheral pulses intact. Abdomen: Nondistended. Soft and nontender. No masses palpable. No organomegaly. No palpable  hernias. Extremities: No edema or joint swelling or deformity. No chronic venous stasis changes. Neurologic: Alert and fully oriented. Gait normal.   Laboratory data:  CBC:  Lab Results  Component Value Date   WBC 8.9 06/11/2013   RBC 4.04 06/11/2013   HGB 12.6 06/11/2013   HGB 12.6 05/07/2007   HCT 38.3 06/11/2013   HCT 37.0 05/07/2007   PLT 354  06/11/2013  ]  [facility] Labs:  Lab Results  Component Value Date   NA 143 06/11/2013   NA 140 05/07/2007   K 4.0 06/11/2013   K 4.1 05/07/2007   CO2 19* 06/11/2013   BUN 11.0 06/11/2013   CREATININE 0.7 06/11/2013   CALCIUM 10.0 06/11/2013   PROT 7.5 06/11/2013   BILITOT 0.26 06/11/2013   ALKPHOS 64 06/11/2013   AST 24 06/11/2013   ALT 25 06/11/2013     Assessment  68 y.o. female with a new diagnosis of cancer of the the right breast central and upper breast.  Clinical IIA, estrogen receptor positive, progesterone receptor positive and Ki 67 - growth factor high. I discussed with the patient and family members present today initial surgical treatment options. We discussed options of breast conservation with lumpectomy or total mastectomy and sentinal lymph node biopsy/dissection. Due to the large size and the central location of her tumor I do not believe she would be a candidate for breast conservation and have recommended total mastectomy.Options for reconstruction were discussed. She is very interested in immediate reconstruction. I think this would be feasible unless she would require postmastectomy radiation which it seems she would not. After discussion they have elected to proceed with right total mastectomy.  We discussed the indications and nature of the procedure, and expected recovery, in detail. Surgical risks including anesthetic complications, cardiorespiratory complications, bleeding, infection, wound healing complications, blood clots, lymphedema, local and distant recurrence and possible need for further surgery based on the final pathology was discussed and understood.  Chemotherapy, hormonal therapy and radiation therapy have been discussed. They have been provided with literature regarding the treatment of breast cancer.  All questions were answered. They understand and agree to proceed and we will go ahead with scheduling.  Plan Right total mastectomy with axillary sentinel lymph node  biopsy and possible axillary dissection. The patient is interested in immediate reconstruction and we will refer to  Plastic surgery for consultation as soon as possible.  Edward Jolly MD, FACS  06/11/2013, 3:24 PM

## 2013-07-05 LAB — CBC
HCT: 32.7 % — ABNORMAL LOW (ref 36.0–46.0)
HEMOGLOBIN: 10.6 g/dL — AB (ref 12.0–15.0)
MCH: 31.9 pg (ref 26.0–34.0)
MCHC: 32.4 g/dL (ref 30.0–36.0)
MCV: 98.5 fL (ref 78.0–100.0)
Platelets: 288 10*3/uL (ref 150–400)
RBC: 3.32 MIL/uL — AB (ref 3.87–5.11)
RDW: 13.3 % (ref 11.5–15.5)
WBC: 11.2 10*3/uL — AB (ref 4.0–10.5)

## 2013-07-05 LAB — BASIC METABOLIC PANEL
BUN: 11 mg/dL (ref 6–23)
CHLORIDE: 103 meq/L (ref 96–112)
CO2: 25 mEq/L (ref 19–32)
Calcium: 9.2 mg/dL (ref 8.4–10.5)
Creatinine, Ser: 0.67 mg/dL (ref 0.50–1.10)
GFR calc Af Amer: >90 mL/min (ref >90)
GFR calc non Af Amer: >90 mL/min (ref >90)
Glucose, Bld: 105 mg/dL — ABNORMAL HIGH (ref 70–99)
Potassium: 4.3 mEq/L (ref 3.7–5.3)
SODIUM: 141 meq/L (ref 137–147)

## 2013-07-05 MED ORDER — OXYCODONE-ACETAMINOPHEN 5-325 MG PO TABS: 1.0000 | ORAL_TABLET | ORAL | Status: DC | PRN

## 2013-07-05 NOTE — Discharge Summary (Signed)
Physician Discharge Summary  Patient ID: Penny Howard MRN: [id number] DOB/AGE: 08-10-1945 68 y.o.  Admit date: 07/04/2013 Discharge date: 07/05/2013  Admission Diagnoses:right breast cancer  Discharge Diagnoses: same Active Problems:   Cancer of breast   Discharged Condition: good  Hospital Course: unremarkable.  Good pain control,  Tolerating diet and wound intact and clean.   Consults: None  Significant Diagnostic Studies: none  Treatments: surgery: right simple mastectomy and SLN mapping  Discharge Exam: Blood pressure 107/53, pulse 58, temperature 98.6 F (37 C), temperature source Oral, resp. rate 15, height 4' 11"  (1.499 m), weight 135 lb (61.236 kg), SpO2 97.00%. Incision/Wound:C/D/I   Disposition: Final discharge disposition not confirmed  Discharge Instructions   Diet - low sodium heart healthy    Complete by:  As directed      Increase activity slowly    Complete by:  As directed             Medication List         escitalopram 10 MG tablet  Commonly known as:  LEXAPRO  Take 10 mg by mouth daily.     esomeprazole 20 MG capsule  Commonly known as:  NEXIUM  Take 20 mg by mouth daily at 12 noon.     Fish Oil 1000 MG Caps  Take 1,000 mg by mouth daily.     lisinopril 10 MG tablet  Commonly known as:  PRINIVIL,ZESTRIL  Take 10 mg by mouth daily.     multivitamin-lutein Caps capsule  Take 1 capsule by mouth daily. 250 mg capsule     oxyCODONE-acetaminophen 5-325 MG per tablet  Commonly known as:  PERCOCET/ROXICET  Take 1-2 tablets by mouth every 4 (four) hours as needed for moderate pain.     Red Yeast Rice 600 MG Caps  Take 600 mg by mouth daily.     risedronate 150 MG tablet  Commonly known as:  ACTONEL  Take 150 mg by mouth every 30 (thirty) days. with water on empty stomach, nothing by mouth or lie down for next 30 minutes.     UNABLE TO FIND  [id number]- Post Mastectomy Camisole-2     vitamin E 400 UNIT capsule  Take 400 Units by  mouth daily.         Signed: Joyice Faster. Maisa Bedingfield 07/05/2013, 10:14 AM

## 2013-07-05 NOTE — Progress Notes (Signed)
1 Day Post-Op  Subjective: Doing well  Objective: Vital signs in last 24 hours: Temp:  [98.2 F (36.8 C)-98.8 F (37.1 C)] 98.6 F (37 C) (05/16 0641) Pulse Rate:  [58-117] 58 (05/16 0641) Resp:  [14-17] 15 (05/16 0641) BP: (98-150)/(51-82) 107/53 mmHg (05/16 0641) SpO2:  [94 %-98 %] 97 % (05/16 0641) Weight:  [135 lb (61.236 kg)] 135 lb (61.236 kg) (05/15 1104) Last BM Date: 07/04/13  Intake/Output from previous day: 05/15 0701 - 05/16 0700 In: 6720 [P.O.:1040; I.V.:2233] Out: 715 [Urine:600; Drains:115] Intake/Output this shift: Total I/O In: 480 [P.O.:480] Out: -   Incision/Wound:C/D I mastectomy site right  Drains serous  Lab Results:   Recent Labs  07/05/13 0614  WBC 11.2*  HGB 10.6*  HCT 32.7*  PLT 288   BMET  Recent Labs  07/05/13 0614  NA 141  K 4.3  CL 103  CO2 25  GLUCOSE 105*  BUN 11  CREATININE 0.67  CALCIUM 9.2   PT/INR No results found for this basename: LABPROT, INR,  in the last 72 hours ABG No results found for this basename: PHART, PCO2, PO2, HCO3,  in the last 72 hours  Studies/Results: Nm Sentinel Node Inj-no Rpt (breast)  07/04/2013   CLINICAL DATA: Cancer right breast   Sulfur colloid was injected intradermally by the nuclear medicine  technologist for breast cancer sentinel node localization.     Anti-infectives: Anti-infectives   Start     Dose/Rate Route Frequency Ordered Stop   07/04/13 0600  ceFAZolin (ANCEF) IVPB 2 g/50 mL premix     2 g 100 mL/hr over 30 Minutes Intravenous On call to O.R. 07/03/13 1430 07/04/13 0740      Assessment/Plan: s/p Procedure(s): RIGHT TOTAL MASTECTOMY WITH RIGHT  AXILLARY SENTINEL LYMPH NODE BIOPSY (Right) Discharge  LOS: 1 day    Myesha Stillion A. Anthoney Sheppard 07/05/2013

## 2013-07-05 NOTE — Discharge Planning (Signed)
Copy of AVS to patient who verbalizes understanding, also given rx.  Dc'd to private car with all personal belongings, acomp. By husband.

## 2013-07-05 NOTE — Progress Notes (Signed)
Utilization review complete 

## 2013-07-05 NOTE — Discharge Instructions (Signed)
[facility] [place] °[phone] ° °MASTECTOMY: POST OP INSTRUCTIONS ° °Always review your discharge instruction sheet given to you by the facility where your surgery was performed. °IF YOU HAVE DISABILITY OR FAMILY LEAVE FORMS, YOU MUST BRING THEM TO THE OFFICE FOR PROCESSING.   °DO NOT GIVE THEM TO YOUR DOCTOR. °A prescription for pain medication may be given to you upon discharge.  Take your pain medication as prescribed, if needed.  If narcotic pain medicine is not needed, then you may take acetaminophen (Tylenol) or ibuprofen (Advil) as needed. °1. Take your usually prescribed medications unless otherwise directed. °2. If you need a refill on your pain medication, please contact your pharmacy.  They will contact our office to request authorization.  Prescriptions will not be filled after 5pm or on week-ends. °3. You should follow a light diet the first few days after arrival home, such as soup and crackers, etc.  Resume your normal diet the day after surgery. °4. Most patients will experience some swelling and bruising on the chest and underarm.  Ice packs will help.  Swelling and bruising can take several days to resolve.  °5. It is common to experience some constipation if taking pain medication after surgery.  Increasing fluid intake and taking a stool softener (such as Colace) will usually help or prevent this problem from occurring.  A mild laxative (Milk of Magnesia or Miralax) should be taken according to package instructions if there are no bowel movements after 48 hours. °6. Unless discharge instructions indicate otherwise, leave your bandage dry and in place until your next appointment in 3-5 days.  You may take a limited sponge bath.  No tube baths or showers until the drains are removed.  You may have steri-strips (small skin tapes) in place directly over the incision.  These strips should be left on the skin for 7-10 days.  If your surgeon used skin glue on the incision, you may  shower in 24 hours.  The glue will flake off over the next 2-3 weeks.  Any sutures or staples will be removed at the office during your follow-up visit. °7. DRAINS:  If you have drains in place, it is important to keep a list of the amount of drainage produced each day in your drains.  Before leaving the hospital, you should be instructed on drain care.  Call our office if you have any questions about your drains. °8. ACTIVITIES:  You may resume regular (light) daily activities beginning the next day--such as daily self-care, walking, climbing stairs--gradually increasing activities as tolerated.  You may have sexual intercourse when it is comfortable.  Refrain from any heavy lifting or straining until approved by your doctor. °a. You may drive when you are no longer taking prescription pain medication, you can comfortably wear a seatbelt, and you can safely maneuver your car and apply brakes. °b. RETURN TO WORK:  __________________________________________________________ °9. You should see your doctor in the office for a follow-up appointment approximately 3-5 days after your surgery.  Your doctor’s nurse will typically make your follow-up appointment when she calls you with your pathology report.  Expect your pathology report 2-3 business days after your surgery.  You may call to check if you do not hear from us after three days.   °10. OTHER INSTRUCTIONS: ______________________________________________________________________________________________ ____________________________________________________________________________________________ °WHEN TO CALL YOUR DOCTOR: °1. Fever over 101.0 °2. Nausea and/or vomiting °3. Extreme swelling or bruising °4. Continued bleeding from incision. °5. Increased pain, redness, or drainage from the incision. °  The clinic staff is available to answer your questions during regular business hours.  Please don’t hesitate to call and ask to speak to one of the nurses for clinical  concerns.  If you have a medical emergency, go to the nearest emergency room or call 911.  A surgeon from [facility] is always on call at the hospital. °[address], [address], [place], [place]  [address] ? [address] [address], [place], [place]   [address] °[phone] ? [phone] ? FAX [phone] °Web site: [email or web address] °

## 2013-07-08 ENCOUNTER — Encounter: Payer: Self-pay | Admitting: *Deleted

## 2013-07-08 ENCOUNTER — Encounter (HOSPITAL_COMMUNITY): Payer: Self-pay | Admitting: General Surgery

## 2013-07-08 NOTE — Progress Notes (Signed)
Due to insufficient tissue on core, oncotype Dx sent on excision.  Requisition sent to pathology.  Received by Edmonia Lynch.

## 2013-07-09 ENCOUNTER — Telehealth (INDEPENDENT_AMBULATORY_CARE_PROVIDER_SITE_OTHER): Payer: Self-pay | Admitting: General Surgery

## 2013-07-09 NOTE — Telephone Encounter (Signed)
Called the patient and discussed path report

## 2013-07-10 ENCOUNTER — Ambulatory Visit (INDEPENDENT_AMBULATORY_CARE_PROVIDER_SITE_OTHER): Payer: Medicare Other | Admitting: General Surgery

## 2013-07-10 VITALS — BP 122/84 | HR 66 | Temp 98.0°F | Resp 16 | Ht 59.0 in | Wt 132.4 lb

## 2013-07-10 DIAGNOSIS — C50411 Malignant neoplasm of upper-outer quadrant of right female breast: Secondary | ICD-10-CM

## 2013-07-10 DIAGNOSIS — C50419 Malignant neoplasm of upper-outer quadrant of unspecified female breast: Secondary | ICD-10-CM

## 2013-07-10 NOTE — Progress Notes (Signed)
History: Patient returns 1 week following total mastectomy with sentinel lymph node biopsy. She reports she is doing well with no complaints.  Exam: BP 122/84  Pulse 66  Temp(Src) 98 F (36.7 C) (Temporal)  Resp 16  Ht 4' 11"  (1.499 m)  Wt 132 lb 6.4 oz (60.056 kg)  BMI 26.73 kg/m2 General: Appears well Chest wall: Incision healing nicely. JP drainage is down to about 15 cc per day and her drain is removed.  Pathology; Diagnosis 1. Lymph node, sentinel, biopsy, Right axillary - THERE IS NO EVIDENCE OF CARCINOMA IN 1 OF 1 LYMPH NODE (0/1). 2. Lymph node, sentinel, biopsy, Right axillary - THERE IS NO EVIDENCE OF CARCINOMA IN 1 OF 1 LYMPH NODE (0/1). 3. Breast, simple mastectomy, Right - INVASIVE LOBULAR CARCINOMA, GRADE II/III, THE LARGEST FOCUS SPANS 4.2 CM. - ATYPICAL LOBULAR HYPERPLASIA. - LYMPHOVASCULAR INVASION IS IDENTIFIED. - THE SURGICAL RESECTION MARGINS ARE NEGATIVE FOR INVASIVE Tumor   Assessment and plan: Doing well following right total mastectomy as above. Drain removed. Return in 3-4 weeks. She has a medical oncology appointment in the near future.

## 2013-07-15 DIAGNOSIS — C50919 Malignant neoplasm of unspecified site of unspecified female breast: Secondary | ICD-10-CM | POA: Diagnosis not present

## 2013-07-16 ENCOUNTER — Encounter: Payer: Self-pay | Admitting: *Deleted

## 2013-07-16 ENCOUNTER — Encounter (HOSPITAL_COMMUNITY): Payer: Self-pay

## 2013-07-16 NOTE — Progress Notes (Signed)
Received Oncotype Dx results of 6.  Emailed results to Dr. Jana Hakim & placed a copy in his box.  Took a copy to HIM to scan.

## 2013-07-17 ENCOUNTER — Other Ambulatory Visit (INDEPENDENT_AMBULATORY_CARE_PROVIDER_SITE_OTHER): Payer: Self-pay

## 2013-07-17 MED ORDER — UNABLE TO FIND: Status: DC

## 2013-07-21 ENCOUNTER — Telehealth: Payer: Self-pay | Admitting: *Deleted

## 2013-07-21 NOTE — Telephone Encounter (Signed)
Gave pt oncotype dx results of 6. Per Dr. Jana Hakim she does not need chemotherapy.  Her next step is possible radiation.  Scheduled appt with Dr. Lisbeth Renshaw to discuss this.  Confirmed appt on 07/28/13. Pt denies further questions at this time.

## 2013-07-23 ENCOUNTER — Encounter: Payer: Self-pay | Admitting: *Deleted

## 2013-07-24 ENCOUNTER — Ambulatory Visit: Payer: Medicare Other | Admitting: Oncology

## 2013-07-24 NOTE — Progress Notes (Signed)
Location of Breast Cancer:  Upper outer quadrant Right Breast  12 o'clock position  Histology per Pathology Report: Diagnosis 06/02/13:  Breast, right, needle core biopsy, mass, 12 o' clock- INVASIVE MAMMARY CARCINOMA.  Microscopic Comment: The carcinoma has lobular features and is grade II  Receptor Status: ER(  + ), PR ( +  ), Her2-neu ( KI-67 80%  )  Did patient present with symptoms (if so, please note symptoms) or was this found on screening mammography?: patient found lump in breast herself,, mammogram scheuled 06/02/13  Past/Anticipated interventions by surgeon, if any:Diagnosis 07/04/13: 1. Lymph node, sentinel, biopsy, Right axillary- THERE IS NO EVIDENCE OF CARCINOMA IN 1 OF 1 LYMPH NODE (0/1).2. Lymph node, sentinel, biopsy, Right axillary - THERE IS NO EVIDENCE OF CARCINOMA IN 1 OF 1 LYMPH NODE (0/1).3. Breast, simple mastectomy, Right- INVASIVE LOBULAR CARCINOMA, GRADE II/III, THE LARGEST FOCUS SPANS 4.2 CM. - ATYPICAL LOBULAR HYPERPLASIA.- LYMPHOVASCULAR INVASION IS IDENTIFIED.- THE SURGICAL RESECTION MARGINS ARE NEGATIVE FOR INVASIVE CARCINOMA, Dr. Benjamin T.Hoxworth,MD Seen 07/10/13 and follow up s/p op again 07/30/13  Past/Anticipated interventions by medical oncology, if any: Chemotherapy: seen in  Breast clinic Dr.Magrinat, Oncotype testing 07/09/13=recurrence score=6  Lymphedema issues, if any: no  Pain issues, if any:  Tenderness right mastectomy site, glue still on well healed incision  SAFETY ISSUES:  Prior radiation? No  Pacemaker/ICD?  No  Possible current pregnancy? No  Is the patient on methotrexate?  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   

## 2013-07-27 ENCOUNTER — Telehealth: Payer: Self-pay | Admitting: Oncology

## 2013-07-27 NOTE — Telephone Encounter (Signed)
S/w pt re appt for 7/27.

## 2013-07-28 ENCOUNTER — Ambulatory Visit
Admission: RE | Admit: 2013-07-28 | Discharge: 2013-07-28 | Disposition: A | Discharge status: Home / Self Care | Payer: Medicare Other | Source: Ambulatory Visit | Attending: Radiation Oncology | Admitting: Radiation Oncology

## 2013-07-28 ENCOUNTER — Encounter: Payer: Self-pay | Admitting: Radiation Oncology

## 2013-07-28 VITALS — BP 166/79 | HR 61 | Temp 97.4°F | Resp 20 | Ht 59.0 in | Wt 134.8 lb

## 2013-07-28 DIAGNOSIS — Z170 Estrogen receptor positive status [ER+]: Secondary | ICD-10-CM | POA: Diagnosis not present

## 2013-07-28 DIAGNOSIS — Z510 Encounter for antineoplastic radiation therapy: Secondary | ICD-10-CM | POA: Diagnosis not present

## 2013-07-28 DIAGNOSIS — Z79899 Other long term (current) drug therapy: Secondary | ICD-10-CM | POA: Insufficient documentation

## 2013-07-28 DIAGNOSIS — C50419 Malignant neoplasm of upper-outer quadrant of unspecified female breast: Secondary | ICD-10-CM

## 2013-07-28 DIAGNOSIS — Z9010 Acquired absence of unspecified breast and nipple: Secondary | ICD-10-CM | POA: Diagnosis not present

## 2013-07-28 DIAGNOSIS — C50919 Malignant neoplasm of unspecified site of unspecified female breast: Secondary | ICD-10-CM | POA: Diagnosis not present

## 2013-07-28 DIAGNOSIS — C50411 Malignant neoplasm of upper-outer quadrant of right female breast: Secondary | ICD-10-CM

## 2013-07-28 HISTORY — DX: Age-related osteoporosis without current pathological fracture: M81.0

## 2013-07-28 HISTORY — DX: Allergy, unspecified, initial encounter: T78.40XA

## 2013-07-28 NOTE — Progress Notes (Signed)
Radiation Oncology         [phone] ________________________________  Name: Penny Howard MRN: [id number]  Date: 07/28/2013  DOB: 11/14/1945  Follow-Up Visit Note  CC: Florina Ou, MD  Magrinat, Virgie Dad, MD  Diagnosis:   Invasive lobular carcinoma of the right breast  Interval Since Last Radiation:  Not applicable   Narrative:  The patient returns today for routine follow-up.  The patient proceeded with a right-sided mastectomy on 07/04/2013. The surgery went well. Pathology returned positive for an invasive lobular carcinoma, grade 2, with the largest dominant tumor measuring 4.2 cm. The margins were negative. None of 2 sentinel lymph nodes demonstrated carcinoma. Receptor studies have indicated that the tumor is ER positive and PR positive. The patient is feeling very well from this surgery.                              ALLERGIES:  is allergic to codeine.  Meds: Current Outpatient Prescriptions  Medication Sig Dispense Refill  . escitalopram (LEXAPRO) 10 MG tablet Take 10 mg by mouth daily.      Marland Kitchen esomeprazole (NEXIUM) 20 MG capsule Take 20 mg by mouth daily at 12 noon.      Marland Kitchen ibuprofen (ADVIL,MOTRIN) 400 MG tablet Take 400 mg by mouth every 6 (six) hours as needed.      Marland Kitchen lisinopril (PRINIVIL,ZESTRIL) 10 MG tablet Take 10 mg by mouth daily.      . multivitamin-lutein (OCUVITE-LUTEIN) CAPS capsule Take 1 capsule by mouth daily. 250 mg capsule      . Omega-3 Fatty Acids (FISH OIL) 1000 MG CAPS Take 1,000 mg by mouth daily.      . Red Yeast Rice 600 MG CAPS Take 600 mg by mouth daily.      . risedronate (ACTONEL) 150 MG tablet Take 150 mg by mouth every 30 (thirty) days. with water on empty stomach, nothing by mouth or lie down for next 30 minutes.      Marland Kitchen UNABLE TO FIND W0981- Post Mastectomy Camisole-2  1 each  0  . UNABLE TO FIND Right Partial Mastectomy 174.9  L8000- Post Surgical Bras-6 X9147- Non-Silicone Breast Prosthesis- 1  1 each  0  . vitamin E 400 UNIT capsule  Take 400 Units by mouth daily.      Marland Kitchen oxyCODONE-acetaminophen (PERCOCET/ROXICET) 5-325 MG per tablet Take 1-2 tablets by mouth every 4 (four) hours as needed for moderate pain.  30 tablet  0   No current facility-administered medications for this encounter.    Physical Findings: The patient is in no acute distress. Patient is alert and oriented.  height is 4' 11"  (1.499 m) and weight is 134 lb 12.8 oz (61.145 kg). Her oral temperature is 97.4 F (36.3 C). Her blood pressure is 166/79 and her pulse is 61. Her respiration is 20. .     Lab Findings: Lab Results  Component Value Date   WBC 11.2* 07/05/2013   HGB 10.6* 07/05/2013   HCT 32.7* 07/05/2013   MCV 98.5 07/05/2013   PLT 288 07/05/2013     Radiographic Findings: Nm Sentinel Node Inj-no Rpt (breast)  07/04/2013   CLINICAL DATA: Cancer right breast   Sulfur colloid was injected intradermally by the nuclear medicine  technologist for breast cancer sentinel node localization.     Impression:    The patient is status post mastectomy for a T2N0M0 invasive lobular carcinoma of the left breast. The patient  also had an Oncotype test which returned with a low score of 6. The patient is healing well.  The patient's case was discussed in multidisciplinary breast conference. The patient had an excellent result and I have not recommended postmastectomy radiation treatment for her. She also is aware that chemotherapy is not recommended for her. She has been scheduled to be seen in medical oncology in several weeks to discuss hormonal treatment.   Plan:   No further followup needed. The patient knows to contact our office if we can be of any further assistance. She has been very pleased with her results and care at this time.  I spent 15 minutes with the patient today, the majority of which was spent counseling the patient on the diagnosis of cancer and coordinating care.   Jodelle Gross, M.D., Ph.D.

## 2013-07-28 NOTE — Progress Notes (Signed)
Please see the Nurse Progress Note in the MD Initial Consult Encounter for this patient. 

## 2013-07-30 ENCOUNTER — Encounter (INDEPENDENT_AMBULATORY_CARE_PROVIDER_SITE_OTHER): Payer: Self-pay | Admitting: General Surgery

## 2013-07-30 ENCOUNTER — Ambulatory Visit (INDEPENDENT_AMBULATORY_CARE_PROVIDER_SITE_OTHER): Payer: Medicare Other | Admitting: General Surgery

## 2013-07-30 VITALS — BP 142/90 | HR 60 | Temp 97.8°F | Resp 14 | Ht 59.0 in | Wt 134.4 lb

## 2013-07-30 DIAGNOSIS — C50419 Malignant neoplasm of upper-outer quadrant of unspecified female breast: Secondary | ICD-10-CM

## 2013-07-30 DIAGNOSIS — C50411 Malignant neoplasm of upper-outer quadrant of right female breast: Secondary | ICD-10-CM

## 2013-07-30 NOTE — Progress Notes (Signed)
History: Patient returns for more long-term followup status post right total mastectomy for a T2 N0 lobular cancer of the breast.  She has some soreness on her arm occasionally but no other complaints. She has seen radiation oncology and no radiation planned. She has a medical oncology appointment to consider hormonal treatment.  Exam: BP 142/90  Pulse 60  Temp(Src) 97.8 F (36.6 C) (Oral)  Resp 14  Ht 4' 11"  (1.499 m)  Wt 134 lb 6.4 oz (60.963 kg)  BMI 27.13 kg/m2 General: Appears well Chest wall: Incision well healed without seroma or other complicating factor. Normal range of motion of her arm.  Assessment and plan: Doing well following mastectomy as above. She has a prosthesis that is working for her. She is planning reconstructive surgery later this year. I will see her back in 6 months.

## 2013-09-15 ENCOUNTER — Ambulatory Visit (HOSPITAL_BASED_OUTPATIENT_CLINIC_OR_DEPARTMENT_OTHER): Payer: Medicare Other | Admitting: Oncology

## 2013-09-15 VITALS — BP 149/71 | HR 65 | Temp 97.8°F | Resp 18 | Ht 59.0 in | Wt 136.3 lb

## 2013-09-15 DIAGNOSIS — M949 Disorder of cartilage, unspecified: Secondary | ICD-10-CM

## 2013-09-15 DIAGNOSIS — C50919 Malignant neoplasm of unspecified site of unspecified female breast: Secondary | ICD-10-CM

## 2013-09-15 DIAGNOSIS — C50411 Malignant neoplasm of upper-outer quadrant of right female breast: Secondary | ICD-10-CM

## 2013-09-15 DIAGNOSIS — Z170 Estrogen receptor positive status [ER+]: Secondary | ICD-10-CM

## 2013-09-15 DIAGNOSIS — M899 Disorder of bone, unspecified: Secondary | ICD-10-CM | POA: Diagnosis not present

## 2013-09-15 MED ORDER — TAMOXIFEN CITRATE 20 MG PO TABS: 20.0000 mg | ORAL_TABLET | Freq: Every day | ORAL | Status: DC

## 2013-09-15 NOTE — Progress Notes (Signed)
[facility]  [phone] [phone]     ID: Penny Howard OB: 06/10/1945  MR#: [id number]  ZYS#:[id number]  PCP: Florina Ou, MD GYN:   SU: Excell Seltzer OTHER MD: Kyung Rudd  CHIEF COMPLAINT: Estrogen receptor positive breast cancer CURRENT TREATMENT: Tamoxifen   BREAST CANCER HISTORY: From the original intake note:  Penny Howard was bending over to get into her hot tub when she noted a mass in her right breast. She intended to keep it to herself, but her husband of 90 years, Penny Howard, noted her changed expression, asked her what it was about, and when she told him they got out of the top and called on her primary care physician. On 06/02/2013 the patient underwent right breast mammography and ultrasonography,  which showed an area of increased density in the superior aspect of the right breast, which was palpable and firm. By palpation it measured approximately 5 cm. Ultrasound located an irregular mass in the right breast at the 12:00 position measuring 4 cm maximally. Ultrasound of the right axilla showed normal axillary contents.  Biopsy of the right breast mass in question 06/02/2013 ([id number]) confirmed an invasive lobular tumor, grade 2, estrogen and progesterone receptors both 100% positive in both with strong staining intensity, with an MIB-1 of 80%. HER-2 is pending.  On 06/03/2013 the patient underwent left breast mammography, which was negative. On 06/09/2013 the patient underwent bilateral breast MRI. This showed, in the left breast, multiple enhancing nodules in the central portion measuring up to 4.6 cm in aggregate. There was evidence of nipple retraction. The largest single  discrete mass measured 2.7 cm. There was no involvement of the pectoralis and no abnormal appearing lymph nodes. The right breast was unremarkable.  The patient's subsequent history is as detailed below  INTERVAL HISTORY: Penny Howard returns today for followup of her  breast cancer accompanied by her husband Penny Howard. Since her last visit here she had her definitive surgery, namely a right mastectomy with sentinel lymph node sampling 07/04/2013, which showed ([id number]) 84.2 cm area of invasive lobular carcinoma, grade 2, with both sentinel lymph nodes clear. Margins were ample. Repeat HER-2 was again negative.  REVIEW OF SYSTEMS: She tolerated the surgery well, with no unusual pain, fever, or bleeding. She is just beginning to get back in her usual exercise routine. A detailed review of systems today was otherwise noncontributory  PAST MEDICAL HISTORY: Past Medical History  Diagnosis Date  . Hypertension   . GERD (gastroesophageal reflux disease)   . High cholesterol   . Anxiety   . Cancer     breast rt  . Stress incontinence   . Allergy   . Osteoporosis     PAST SURGICAL HISTORY: Past Surgical History  Procedure Laterality Date  . Bladder surgery  10    mesh  . Mastectomy w/ sentinel node biopsy  07/04/2013    RT TOTAL            DR HOXWORTH  . Simple mastectomy with axillary sentinel node biopsy Right 07/04/2013    Procedure: RIGHT TOTAL MASTECTOMY WITH RIGHT  AXILLARY SENTINEL LYMPH NODE BIOPSY;  Surgeon: Edward Jolly, MD;  Location: [facility] OR;  Service: General;  Laterality: Right;  . Abdominal hysterectomy  1990    ovaries intact b/l    FAMILY HISTORY Family History  Problem Relation Age of Onset  . Breast cancer Mother   . Breast cancer Paternal Aunt   . Lymphoma Cousin  non- hodgkins lymphoma   the patient's father died with congestive heart failure at the age of 40. The patient's mother died from "multiple causes" at the age of 30. Penny Howard had one brother, no sisters. The patient's mother was diagnosed with breast cancer the age of 18 there is also a paternal aunt diagnosed with breast cancer at the age of 9.  GYNECOLOGIC HISTORY:  Menarche age 5, first live birth age 47, the patient is [place] P2. She stopped having periods  approximately 1990. She did not take hormone replacement. 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 Her husband Penny Howard (goes by "choked") used to work Sales executive stations. Their name is pronounced "Nam-zura"--it is of [place] origin. Daughter Penny Howard lives in [place] for she is a Nurse, children's and daughter Penny Howard lives in [place] where she is a housewife. The patient has 1 grandchild. She attends a [employer].    ADVANCED DIRECTIVES: Not in place   HEALTH MAINTENANCE: History  Substance Use Topics  . Smoking status: Never Smoker   . Smokeless tobacco: Never Used  . Alcohol Use: 3.0 oz/week    5 Glasses of wine per week     Comment: occaionally     Colonoscopy: 2001?/Dr. Collene Mares  PAP:  Bone density: 2014/osteopenia  Lipid panel:  Allergies  Allergen Reactions  . Codeine Nausea Only    Current Outpatient Prescriptions  Medication Sig Dispense Refill  . escitalopram (LEXAPRO) 10 MG tablet Take 10 mg by mouth daily.      Marland Kitchen esomeprazole (NEXIUM) 20 MG capsule Take 20 mg by mouth daily at 12 noon.      Marland Kitchen ibuprofen (ADVIL,MOTRIN) 400 MG tablet Take 400 mg by mouth every 6 (six) hours as needed.      Marland Kitchen lisinopril (PRINIVIL,ZESTRIL) 10 MG tablet Take 10 mg by mouth daily.      . multivitamin-lutein (OCUVITE-LUTEIN) CAPS capsule Take 1 capsule by mouth daily. 250 mg capsule      . Omega-3 Fatty Acids (FISH OIL) 1000 MG CAPS Take 1,000 mg by mouth daily.      . Red Yeast Rice 600 MG CAPS Take 600 mg by mouth daily.      . risedronate (ACTONEL) 150 MG tablet Take 150 mg by mouth every 30 (thirty) days. with water on empty stomach, nothing by mouth or lie down for next 30 minutes.      Marland Kitchen UNABLE TO FIND L5726- Post Mastectomy Camisole-2  1 each  0  . UNABLE TO FIND Right Partial Mastectomy 174.9  L8000- Post Surgical Bras-6 O0355- Non-Silicone Breast  Prosthesis- 1  1 each  0  . vitamin E 400 UNIT capsule Take 400 Units by mouth daily.       No current facility-administered medications for this visit.    OBJECTIVE: Middle-aged white woman in no acute distress Filed Vitals:   09/15/13 1207  BP: 149/71  Pulse: 65  Temp: 97.8 F (36.6 C)  Resp: 18     Body mass index is 27.51 kg/(m^2).    ECOG FS:0 - Asymptomatic  Ocular: Sclerae unicteric, pupils round and equal Ear-nose-throat: Oropharynx clear and moist Lymphatic: No cervical or supraclavicular adenopathy Lungs no rales or rhonchi Heart regular rate and rhythm Abd soft, nontender, positive bowel sounds MSK no focal spinal tenderness, no upper extremity lymphedema Neuro: non-focal, well-oriented, positive affect Breasts: The right breast is status post mastectomy.  The incision is healing nicely. There is no dehiscence, evidence of inflammation or swelling, or unusual tenderness. The right axilla is benign. Left breast is unremarkable.   LAB RESULTS:  CMP     Component Value Date/Time   NA 141 07/05/2013 0614   NA 143 06/11/2013 1306   K 4.3 07/05/2013 0614   K 4.0 06/11/2013 1306   CL 103 07/05/2013 0614   CO2 25 07/05/2013 0614   CO2 19* 06/11/2013 1306   GLUCOSE 105* 07/05/2013 0614   GLUCOSE 107 06/11/2013 1306   BUN 11 07/05/2013 0614   BUN 11.0 06/11/2013 1306   CREATININE 0.67 07/05/2013 0614   CREATININE 0.7 06/11/2013 1306   CALCIUM 9.2 07/05/2013 0614   CALCIUM 10.0 06/11/2013 1306   PROT 7.5 06/11/2013 1306   ALBUMIN 4.1 06/11/2013 1306   AST 24 06/11/2013 1306   ALT 25 06/11/2013 1306   ALKPHOS 64 06/11/2013 1306   BILITOT 0.26 06/11/2013 1306   GFRNONAA >90 07/05/2013 0614   GFRAA >90 07/05/2013 0614    I No results found for this basename: SPEP,  UPEP,   kappa and lambda light chains    Lab Results  Component Value Date   WBC 11.2* 07/05/2013   NEUTROABS 6.3 06/11/2013   HGB 10.6* 07/05/2013   HCT 32.7* 07/05/2013   MCV 98.5 07/05/2013   PLT 288 07/05/2013       Chemistry      Component Value Date/Time   NA 141 07/05/2013 0614   NA 143 06/11/2013 1306   K 4.3 07/05/2013 0614   K 4.0 06/11/2013 1306   CL 103 07/05/2013 0614   CO2 25 07/05/2013 0614   CO2 19* 06/11/2013 1306   BUN 11 07/05/2013 0614   BUN 11.0 06/11/2013 1306   CREATININE 0.67 07/05/2013 0614   CREATININE 0.7 06/11/2013 1306      Component Value Date/Time   CALCIUM 9.2 07/05/2013 0614   CALCIUM 10.0 06/11/2013 1306   ALKPHOS 64 06/11/2013 1306   AST 24 06/11/2013 1306   ALT 25 06/11/2013 1306   BILITOT 0.26 06/11/2013 1306       No results found for this basename: LABCA2    No components found with this basename: LABCA125    No results found for this basename: INR,  in the last 168 hours  Urinalysis No results found for this basename: colorurine,  appearanceur,  labspec,  phurine,  glucoseu,  hgbur,  bilirubinur,  ketonesur,  proteinur,  urobilinogen,  nitrite,  leukocytesur    STUDIES: No results found.  ASSESSMENT: 68 y.o. [place] woman status post right breast biopsy 06/02/2013 for a clinical T2 N0, stage IIA invasive lobular breast cancer, grade 2, estrogen receptor and progesterone receptor both outer percent positive, with an MIB-1 of 80%, HER-2 obtained from definitive surgery  (1) status post right mastectomy and sentinel lymph node sampling 07/04/2013 for a pT3, pN0, stage IIB invasive lobular carcinoma, grade 2, HER-2 negative  (2) Oncotype score of 6 predicts a risk of outside the breast recurrence within 10 years and 5% of the patient's only systemic treatment is tamoxifen for 5 years. It also predicts no benefit from chemotherapy  (3) tamoxifen started 09/15/2013  (4) the patient anticipates eventual TRAM flap reconstruction  PLAN: Penny Howard did remarkably well with her surgery and will not need postoperative radiation. Accordingly she has completed her local treatment and is now ready to consider systemic therapy.  We discussed the benefits of antiestrogen as  which include not  only reducing her risk of local recurrence but reducing her risk of developing a new breast cancer in either breast. We discussed the difference between tamoxifen and aromatase inhibitors including the possible toxicities, side effects and complications as well as cost of these agents.  After much discussion she decided she would like to try tamoxifen. If she tolerates it well we will continue this 2 years and then consider whether or not to switch to an aromatase inhibitor. She does not tolerated well we will switch to anastrozole.  Pernie has a good understanding of the overall plan. She agrees with it. She knows that the goal of treatment in her case is cure. She will call with any problems that may develop before her next visit here.   Chauncey Cruel, MD   09/15/2013 12:19 PM

## 2013-09-19 ENCOUNTER — Telehealth: Payer: Self-pay | Admitting: Oncology

## 2013-09-19 NOTE — Telephone Encounter (Signed)
Per 07/27 labs/ov POF, mailed AVS to pt.Marland Kitchen..KJ

## 2013-11-06 DIAGNOSIS — Z853 Personal history of malignant neoplasm of breast: Secondary | ICD-10-CM | POA: Diagnosis not present

## 2013-11-06 DIAGNOSIS — Z9010 Acquired absence of unspecified breast and nipple: Secondary | ICD-10-CM | POA: Diagnosis not present

## 2013-12-01 ENCOUNTER — Telehealth: Payer: Self-pay | Admitting: Oncology

## 2013-12-01 NOTE — Telephone Encounter (Signed)
pt cld to r/s appt-r/s & gave pt new sch

## 2013-12-17 DIAGNOSIS — Z9011 Acquired absence of right breast and nipple: Secondary | ICD-10-CM | POA: Diagnosis not present

## 2013-12-17 DIAGNOSIS — Z853 Personal history of malignant neoplasm of breast: Secondary | ICD-10-CM | POA: Diagnosis not present

## 2013-12-18 ENCOUNTER — Encounter (HOSPITAL_COMMUNITY): Payer: Self-pay | Admitting: Pharmacy Technician

## 2013-12-19 NOTE — H&P (Signed)
  Subjective:    Patient ID: Penny Howard is a 68 y.o. female.  Pre-op Exam   Here for follow preoperative exam for TRAM pedicled breast reconstruction. She presented with palpabe right breast mass and MMG showed density in the superior and subareolar portion of the right breast 12:00 position and ultrasound showing a heterogeneous irregular mass at the 12:00 position and retroareolar measuring 4 cm. MRI revealed a 4.6 cm area of nodular enhancement in the central and retroareolar right breast. She is 3 months post operative from right mastectomy with SLN demonstrating 4.2 cm area of invasive lobular carcinoma with both sentinel lymph nodes clear lobular carcinoma of the breast ER/PR+, Her 2 neg. No adjuvant XRT recommended. Low oncotype score; hormonal treatment recommended.   Prior 36 C  Review of Systems     Objective:    Physical Exam  Cardiovascular: Normal rate, regular rhythm and normal heart sounds.  Pulmonary/Chest: Effort normal and breath sounds normal.  Abdominal:  Soft, no hernias, small panniculus/moderate soft tissue redundancy, Striae over abdomen  Genitourinary: No breast discharge.  L breast grade I ptosis , R chest flat  SN to nipple L 24.5 cm BW R 14 L 14 cm Nipple to IMF L 7 cm       Assessment:      R breast cancer     Plan:      Plan pedicled TRAM. Discussed breast volume would be limited by the amount of abdominal tissue, symmetry procedures at later time. Reviewed 2-3 day inpatient hospital stay, multiple drains, post procedure visits and limitations. Risks including but not limited to flap loss, fat necrosis, need for additional surgery, hematoma, seroma, abdominal bulge, hernia, DVT/PE, cardiopulmonary complication, asymmetry reviewed. Discussed may need Lovenox for home use post discharge   Irene Limbo, MD [facility] Plastic & Reconstructive Surgery [phone]

## 2013-12-22 ENCOUNTER — Other Ambulatory Visit (HOSPITAL_COMMUNITY): Payer: Self-pay | Admitting: *Deleted

## 2013-12-22 NOTE — Pre-Procedure Instructions (Signed)
Penny Howard  12/22/2013   Your procedure is scheduled on:  Tuesday, December 30, 2013 at 12:00 PM.   Report to [facility] Entrance "A" Admitting Office at 10:00 AM.   Call this number if you have problems the morning of surgery: [phone]   Remember:   Do not eat food or drink liquids after midnight Monday, 12/29/13.   Take these medicines the morning of surgery with A SIP OF WATER: escitalopram (LEXAPRO), tamoxifen (NOLVADEX)   Stop Vitamins and Herbal medications as of today.   Do not wear jewelry, make-up or nail polish.  Do not wear lotions, powders, or perfumes. You may NOT wear deodorant.  Do not shave 48 hours prior to surgery.  Do not bring valuables to the hospital.  [facility] is not responsible                  for any belongings or valuables.               Contacts, dentures or bridgework may not be worn into surgery.  Leave suitcase in the car. After surgery it may be brought to your room.  For patients admitted to the hospital, discharge time is determined by your                treatment team.                Special Instructions:  AFB - Preparing for Surgery  Before surgery, you can play an important role.  Because skin is not sterile, your skin needs to be as free of germs as possible.  You can reduce the number of germs on you skin by washing with CHG (chlorahexidine gluconate) soap before surgery.  CHG is an antiseptic cleaner which kills germs and bonds with the skin to continue killing germs even after washing.  Please DO NOT use if you have an allergy to CHG or antibacterial soaps.  If your skin becomes reddened/irritated stop using the CHG and inform your nurse when you arrive at Short Stay.  Do not shave (including legs and underarms) for at least 48 hours prior to the first CHG shower.  You may shave your face.  Please follow these instructions carefully:   1.  Shower with CHG Soap the night before surgery and the                                 morning of Surgery.  2.  If you choose to wash your hair, wash your hair first as usual with your       normal shampoo.  3.  After you shampoo, rinse your hair and body thoroughly to remove the                      Shampoo.  4.  Use CHG as you would any other liquid soap.  You can apply chg directly       to the skin and wash gently with scrungie or a clean washcloth.  5.  Apply the CHG Soap to your body ONLY FROM THE NECK DOWN.        Do not use on open wounds or open sores.  Avoid contact with your eyes, ears, mouth and genitals (private parts).  Wash genitals (private parts) with your normal soap.  6.  Wash thoroughly, paying special attention to the area where your surgery  will be performed.  7.  Thoroughly rinse your body with warm water from the neck down.  8.  DO NOT shower/wash with your normal soap after using and rinsing off       the CHG Soap.  9.  Pat yourself dry with a clean towel.            10.  Wear clean pajamas.            11.  Place clean sheets on your bed the night of your first shower and do not        sleep with pets.  Day of Surgery  Do not apply any lotions/deodorants the morning of surgery.  Please wear clean clothes to the hospital/surgery center.     Please read over the following fact sheets that you were given: Pain Booklet, Coughing and Deep Breathing and Surgical Site Infection Prevention

## 2013-12-23 ENCOUNTER — Encounter (HOSPITAL_COMMUNITY)
Admission: RE | Admit: 2013-12-23 | Discharge: 2013-12-23 | Disposition: A | Discharge status: Home / Self Care | Payer: Medicare Other | Source: Ambulatory Visit | Attending: Plastic Surgery | Admitting: Plastic Surgery

## 2013-12-23 ENCOUNTER — Encounter (HOSPITAL_COMMUNITY): Payer: Self-pay

## 2013-12-23 DIAGNOSIS — Z01812 Encounter for preprocedural laboratory examination: Secondary | ICD-10-CM | POA: Insufficient documentation

## 2013-12-23 LAB — CBC WITH DIFFERENTIAL/PLATELET
BASOS ABS: 0.1 10*3/uL (ref 0.0–0.1)
BASOS PCT: 1 % (ref 0–1)
Eosinophils Absolute: 0.1 10*3/uL (ref 0.0–0.7)
Eosinophils Relative: 2 % (ref 0–5)
HCT: 36.6 % (ref 36.0–46.0)
Hemoglobin: 12.1 g/dL (ref 12.0–15.0)
LYMPHS PCT: 34 % (ref 12–46)
Lymphs Abs: 1.6 10*3/uL (ref 0.7–4.0)
MCH: 31.3 pg (ref 26.0–34.0)
MCHC: 33.1 g/dL (ref 30.0–36.0)
MCV: 94.8 fL (ref 78.0–100.0)
Monocytes Absolute: 0.5 10*3/uL (ref 0.1–1.0)
Monocytes Relative: 10 % (ref 3–12)
NEUTROS ABS: 2.5 10*3/uL (ref 1.7–7.7)
NEUTROS PCT: 53 % (ref 43–77)
Platelets: 322 10*3/uL (ref 150–400)
RBC: 3.86 MIL/uL — ABNORMAL LOW (ref 3.87–5.11)
RDW: 12.1 % (ref 11.5–15.5)
WBC: 4.6 10*3/uL (ref 4.0–10.5)

## 2013-12-23 LAB — BASIC METABOLIC PANEL
ANION GAP: 14 (ref 5–15)
BUN: 12 mg/dL (ref 6–23)
CALCIUM: 9.4 mg/dL (ref 8.4–10.5)
CO2: 22 meq/L (ref 19–32)
Chloride: 103 mEq/L (ref 96–112)
Creatinine, Ser: 0.59 mg/dL (ref 0.50–1.10)
GFR calc non Af Amer: >90 mL/min (ref >90)
Glucose, Bld: 101 mg/dL — ABNORMAL HIGH (ref 70–99)
Potassium: 4.1 mEq/L (ref 3.7–5.3)
Sodium: 139 mEq/L (ref 137–147)

## 2013-12-23 NOTE — Progress Notes (Signed)
PCP is Dr Florina Ou Denies seeing a cardiologist, denies any chest pain or shortness of breat.EKG noted form 06-27-13 Denies ever having a stress test, card cath or echo.

## 2013-12-29 MED ORDER — CEFAZOLIN SODIUM-DEXTROSE 2-3 GM-% IV SOLR
2.0000 g | INTRAVENOUS | Status: AC
  Administered 2013-12-30: 2 g via INTRAVENOUS
  Filled 2013-12-29: qty 50

## 2013-12-29 MED ORDER — HEPARIN SODIUM (PORCINE) 5000 UNIT/ML IJ SOLN
5000.0000 [IU] | Freq: Once | INTRAMUSCULAR | Status: AC
  Administered 2013-12-30: 5000 [IU] via SUBCUTANEOUS
  Filled 2013-12-29: qty 1

## 2013-12-30 ENCOUNTER — Inpatient Hospital Stay (HOSPITAL_COMMUNITY)
Admission: RE | Admit: 2013-12-30 | Discharge: 2014-01-01 | DRG: 585 | Disposition: A | Discharge status: Home / Self Care | Payer: Medicare Other | Source: Ambulatory Visit | Attending: Plastic Surgery | Admitting: Plastic Surgery

## 2013-12-30 ENCOUNTER — Encounter (HOSPITAL_COMMUNITY)
Admission: RE | Disposition: A | Discharge status: Home / Self Care | Payer: Self-pay | Source: Ambulatory Visit | Attending: Plastic Surgery

## 2013-12-30 ENCOUNTER — Encounter (HOSPITAL_COMMUNITY): Payer: Self-pay | Admitting: *Deleted

## 2013-12-30 ENCOUNTER — Inpatient Hospital Stay (HOSPITAL_COMMUNITY): Payer: Medicare Other | Admitting: Certified Registered Nurse Anesthetist

## 2013-12-30 ENCOUNTER — Other Ambulatory Visit: Payer: Medicare Other

## 2013-12-30 ENCOUNTER — Ambulatory Visit: Payer: Medicare Other | Admitting: Oncology

## 2013-12-30 DIAGNOSIS — L821 Other seborrheic keratosis: Secondary | ICD-10-CM | POA: Diagnosis not present

## 2013-12-30 DIAGNOSIS — Z421 Encounter for breast reconstruction following mastectomy: Principal | ICD-10-CM

## 2013-12-30 DIAGNOSIS — I10 Essential (primary) hypertension: Secondary | ICD-10-CM | POA: Diagnosis present

## 2013-12-30 DIAGNOSIS — Z9011 Acquired absence of right breast and nipple: Secondary | ICD-10-CM

## 2013-12-30 DIAGNOSIS — K219 Gastro-esophageal reflux disease without esophagitis: Secondary | ICD-10-CM | POA: Diagnosis present

## 2013-12-30 DIAGNOSIS — Z853 Personal history of malignant neoplasm of breast: Secondary | ICD-10-CM

## 2013-12-30 DIAGNOSIS — Z9010 Acquired absence of unspecified breast and nipple: Secondary | ICD-10-CM

## 2013-12-30 HISTORY — PX: TRAM: SHX5363

## 2013-12-30 SURGERY — TRANSVERSE RECTUS ABDOMINIS MYOCUTANEOUS (TRAM)
Anesthesia: General | Site: Breast | Laterality: Right

## 2013-12-30 MED ORDER — DIPHENHYDRAMINE HCL 50 MG/ML IJ SOLN
INTRAMUSCULAR | Status: AC
  Filled 2013-12-30: qty 1

## 2013-12-30 MED ORDER — METHYLPREDNISOLONE SODIUM SUCC 125 MG IJ SOLR
60.0000 mg | Freq: Four times a day (QID) | INTRAMUSCULAR | Status: AC
  Administered 2013-12-30 – 2013-12-31 (×3): 60 mg via INTRAVENOUS
  Filled 2013-12-30 (×2): qty 0.96
  Filled 2013-12-30: qty 2
  Filled 2013-12-30: qty 0.96

## 2013-12-30 MED ORDER — LACTATED RINGERS IV SOLN
INTRAVENOUS | Status: DC | PRN
  Administered 2013-12-30 (×3): via INTRAVENOUS

## 2013-12-30 MED ORDER — DIPHENHYDRAMINE HCL 12.5 MG/5ML PO ELIX: 12.5000 mg | ORAL_SOLUTION | Freq: Four times a day (QID) | ORAL | Status: DC | PRN

## 2013-12-30 MED ORDER — ROCURONIUM BROMIDE 50 MG/5ML IV SOLN
INTRAVENOUS | Status: AC
  Filled 2013-12-30: qty 1

## 2013-12-30 MED ORDER — LISINOPRIL 10 MG PO TABS
10.0000 mg | ORAL_TABLET | Freq: Every day | ORAL | Status: DC
  Administered 2013-12-30 – 2014-01-01 (×3): 10 mg via ORAL
  Filled 2013-12-30 (×3): qty 1

## 2013-12-30 MED ORDER — SUCCINYLCHOLINE CHLORIDE 20 MG/ML IJ SOLN
INTRAMUSCULAR | Status: AC
  Filled 2013-12-30: qty 1

## 2013-12-30 MED ORDER — CHLORHEXIDINE GLUCONATE 4 % EX LIQD: 1.0000 "application " | Freq: Once | CUTANEOUS | Status: DC

## 2013-12-30 MED ORDER — ESCITALOPRAM OXALATE 10 MG PO TABS
10.0000 mg | ORAL_TABLET | Freq: Every day | ORAL | Status: DC
  Administered 2013-12-31 – 2014-01-01 (×2): 10 mg via ORAL
  Filled 2013-12-30 (×2): qty 1

## 2013-12-30 MED ORDER — SCOPOLAMINE 1 MG/3DAYS TD PT72
1.0000 | MEDICATED_PATCH | Freq: Once | TRANSDERMAL | Status: AC
  Administered 2013-12-30: 1 via TRANSDERMAL

## 2013-12-30 MED ORDER — SCOPOLAMINE 1 MG/3DAYS TD PT72
MEDICATED_PATCH | TRANSDERMAL | Status: AC
  Filled 2013-12-30: qty 1

## 2013-12-30 MED ORDER — SODIUM CHLORIDE 0.9 % IJ SOLN: 9.0000 mL | INTRAMUSCULAR | Status: DC | PRN

## 2013-12-30 MED ORDER — PHENYLEPHRINE HCL 10 MG/ML IJ SOLN
INTRAMUSCULAR | Status: DC | PRN
  Administered 2013-12-30: 40 ug via INTRAVENOUS

## 2013-12-30 MED ORDER — BUPIVACAINE-EPINEPHRINE (PF) 0.25% -1:200000 IJ SOLN
INTRAMUSCULAR | Status: DC | PRN
  Administered 2013-12-30: 30 mL

## 2013-12-30 MED ORDER — MIDAZOLAM HCL 5 MG/5ML IJ SOLN
INTRAMUSCULAR | Status: DC | PRN
  Administered 2013-12-30: 2 mg via INTRAVENOUS

## 2013-12-30 MED ORDER — OXYCODONE HCL 5 MG/5ML PO SOLN: 5.0000 mg | Freq: Once | ORAL | Status: DC | PRN

## 2013-12-30 MED ORDER — KCL IN DEXTROSE-NACL 20-5-0.45 MEQ/L-%-% IV SOLN
INTRAVENOUS | Status: DC
  Administered 2013-12-30 – 2014-01-01 (×3): via INTRAVENOUS
  Filled 2013-12-30 (×4): qty 1000

## 2013-12-30 MED ORDER — ONDANSETRON HCL 4 MG PO TABS: 4.0000 mg | ORAL_TABLET | Freq: Four times a day (QID) | ORAL | Status: DC | PRN

## 2013-12-30 MED ORDER — PROMETHAZINE HCL 25 MG/ML IJ SOLN: 6.2500 mg | INTRAMUSCULAR | Status: DC | PRN

## 2013-12-30 MED ORDER — FENTANYL CITRATE 0.05 MG/ML IJ SOLN
INTRAMUSCULAR | Status: DC | PRN
  Administered 2013-12-30: 50 ug via INTRAVENOUS
  Administered 2013-12-30: 250 ug via INTRAVENOUS
  Administered 2013-12-30: 25 ug via INTRAVENOUS
  Administered 2013-12-30: 50 ug via INTRAVENOUS
  Administered 2013-12-30: 25 ug via INTRAVENOUS
  Administered 2013-12-30: 50 ug via INTRAVENOUS
  Administered 2013-12-30 (×2): 25 ug via INTRAVENOUS

## 2013-12-30 MED ORDER — GLYCOPYRROLATE 0.2 MG/ML IJ SOLN
INTRAMUSCULAR | Status: DC | PRN
  Administered 2013-12-30: 0.6 mg via INTRAVENOUS

## 2013-12-30 MED ORDER — MIDAZOLAM HCL 2 MG/2ML IJ SOLN
INTRAMUSCULAR | Status: AC
  Filled 2013-12-30: qty 2

## 2013-12-30 MED ORDER — LIDOCAINE HCL (CARDIAC) 20 MG/ML IV SOLN
INTRAVENOUS | Status: DC | PRN
  Administered 2013-12-30: 60 mg via INTRAVENOUS

## 2013-12-30 MED ORDER — ENOXAPARIN SODIUM 40 MG/0.4ML ~~LOC~~ SOLN
40.0000 mg | SUBCUTANEOUS | Status: DC
  Administered 2013-12-31 – 2014-01-01 (×2): 40 mg via SUBCUTANEOUS
  Filled 2013-12-30 (×4): qty 0.4

## 2013-12-30 MED ORDER — HYDROMORPHONE 0.3 MG/ML IV SOLN
INTRAVENOUS | Status: DC
  Administered 2013-12-30: 0.799 mg via INTRAVENOUS
  Administered 2013-12-31: 0.2 mg via INTRAVENOUS
  Administered 2013-12-31: 0.799 mL via INTRAVENOUS
  Administered 2013-12-31: 0.2 mg via INTRAVENOUS

## 2013-12-30 MED ORDER — NALOXONE HCL 0.4 MG/ML IJ SOLN: 0.4000 mg | INTRAMUSCULAR | Status: DC | PRN

## 2013-12-30 MED ORDER — ROCURONIUM BROMIDE 100 MG/10ML IV SOLN
INTRAVENOUS | Status: DC | PRN
  Administered 2013-12-30: 50 mg via INTRAVENOUS
  Administered 2013-12-30: 30 mg via INTRAVENOUS
  Administered 2013-12-30 (×2): 10 mg via INTRAVENOUS

## 2013-12-30 MED ORDER — PROPOFOL 10 MG/ML IV BOLUS
INTRAVENOUS | Status: AC
  Filled 2013-12-30: qty 20

## 2013-12-30 MED ORDER — DIPHENHYDRAMINE HCL 50 MG/ML IJ SOLN: 10.0000 mg | Freq: Once | INTRAMUSCULAR | Status: DC

## 2013-12-30 MED ORDER — FENTANYL CITRATE 0.05 MG/ML IJ SOLN
INTRAMUSCULAR | Status: AC
  Filled 2013-12-30: qty 5

## 2013-12-30 MED ORDER — NEOSTIGMINE METHYLSULFATE 10 MG/10ML IV SOLN
INTRAVENOUS | Status: DC | PRN
  Administered 2013-12-30: 4 mg via INTRAVENOUS

## 2013-12-30 MED ORDER — ONDANSETRON HCL 4 MG/2ML IJ SOLN
INTRAMUSCULAR | Status: DC | PRN
  Administered 2013-12-30: 4 mg via INTRAVENOUS

## 2013-12-30 MED ORDER — DIPHENHYDRAMINE HCL 50 MG/ML IJ SOLN: 12.5000 mg | Freq: Four times a day (QID) | INTRAMUSCULAR | Status: DC | PRN

## 2013-12-30 MED ORDER — HYDROMORPHONE 0.3 MG/ML IV SOLN
INTRAVENOUS | Status: AC
  Administered 2013-12-30: 17:00:00
  Filled 2013-12-30: qty 25

## 2013-12-30 MED ORDER — DEXAMETHASONE SODIUM PHOSPHATE 10 MG/ML IJ SOLN
INTRAMUSCULAR | Status: AC
  Filled 2013-12-30: qty 2

## 2013-12-30 MED ORDER — MIDAZOLAM HCL 2 MG/2ML IJ SOLN: 0.5000 mg | Freq: Once | INTRAMUSCULAR | Status: DC | PRN

## 2013-12-30 MED ORDER — HYDROCODONE-ACETAMINOPHEN 5-325 MG PO TABS
1.0000 | ORAL_TABLET | ORAL | Status: DC | PRN
  Administered 2013-12-31 – 2014-01-01 (×5): 1 via ORAL
  Filled 2013-12-30 (×5): qty 1

## 2013-12-30 MED ORDER — PROPOFOL 10 MG/ML IV BOLUS
INTRAVENOUS | Status: DC | PRN
  Administered 2013-12-30: 150 mg via INTRAVENOUS

## 2013-12-30 MED ORDER — PANTOPRAZOLE SODIUM 40 MG PO TBEC
40.0000 mg | DELAYED_RELEASE_TABLET | Freq: Every day | ORAL | Status: DC
  Administered 2013-12-30 – 2014-01-01 (×3): 40 mg via ORAL
  Filled 2013-12-30 (×3): qty 1

## 2013-12-30 MED ORDER — LACTATED RINGERS IV SOLN
INTRAVENOUS | Status: DC
  Administered 2013-12-30: 10:00:00 via INTRAVENOUS

## 2013-12-30 MED ORDER — ALBUMIN HUMAN 5 % IV SOLN: INTRAVENOUS | Status: DC | PRN

## 2013-12-30 MED ORDER — 0.9 % SODIUM CHLORIDE (POUR BTL) OPTIME
TOPICAL | Status: DC | PRN
  Administered 2013-12-30 (×2): 1000 mL

## 2013-12-30 MED ORDER — ALBUMIN HUMAN 5 % IV SOLN
INTRAVENOUS | Status: DC | PRN
  Administered 2013-12-30: 14:00:00 via INTRAVENOUS

## 2013-12-30 MED ORDER — ONDANSETRON HCL 4 MG/2ML IJ SOLN: 4.0000 mg | Freq: Four times a day (QID) | INTRAMUSCULAR | Status: DC | PRN

## 2013-12-30 MED ORDER — CEFAZOLIN SODIUM 1-5 GM-% IV SOLN
1.0000 g | Freq: Three times a day (TID) | INTRAVENOUS | Status: AC
  Administered 2013-12-30 – 2014-01-01 (×5): 1 g via INTRAVENOUS
  Filled 2013-12-30 (×6): qty 50

## 2013-12-30 MED ORDER — DEXAMETHASONE SODIUM PHOSPHATE 10 MG/ML IJ SOLN
INTRAMUSCULAR | Status: DC | PRN
  Administered 2013-12-30 (×2): 4 mg via INTRAVENOUS

## 2013-12-30 MED ORDER — DIPHENHYDRAMINE HCL 50 MG/ML IJ SOLN
INTRAMUSCULAR | Status: DC | PRN
  Administered 2013-12-30: 10 mg via INTRAVENOUS

## 2013-12-30 MED ORDER — BUPIVACAINE-EPINEPHRINE (PF) 0.25% -1:200000 IJ SOLN
INTRAMUSCULAR | Status: AC
  Filled 2013-12-30: qty 30

## 2013-12-30 MED ORDER — OXYCODONE HCL 5 MG PO TABS: 5.0000 mg | ORAL_TABLET | Freq: Once | ORAL | Status: DC | PRN

## 2013-12-30 MED ORDER — HYDROMORPHONE HCL 1 MG/ML IJ SOLN
0.2500 mg | INTRAMUSCULAR | Status: DC | PRN
  Administered 2013-12-30: 0.25 mg via INTRAVENOUS

## 2013-12-30 MED ORDER — MEPERIDINE HCL 25 MG/ML IJ SOLN: 6.2500 mg | INTRAMUSCULAR | Status: DC | PRN

## 2013-12-30 MED ORDER — HYDROMORPHONE HCL 1 MG/ML IJ SOLN
INTRAMUSCULAR | Status: AC
  Filled 2013-12-30: qty 1

## 2013-12-30 SURGICAL SUPPLY — 75 items
APPLIER CLIP 9.375 MED OPEN (MISCELLANEOUS) ×4
BAG DECANTER FOR FLEXI CONT (MISCELLANEOUS) IMPLANT
BINDER ABDOMINAL 12 ML 46-62 (SOFTGOODS) ×2 IMPLANT
BLADE 10 SAFETY STRL DISP (BLADE) IMPLANT
BLADE SURG 10 STRL SS (BLADE) ×6 IMPLANT
BLADE SURG ROTATE 9660 (MISCELLANEOUS) ×2 IMPLANT
CANISTER SUCTION 2500CC (MISCELLANEOUS) ×2 IMPLANT
CHLORAPREP W/TINT 26ML (MISCELLANEOUS) ×4 IMPLANT
CLIP APPLIE 9.375 MED OPEN (MISCELLANEOUS) ×2 IMPLANT
CONT SPEC 4OZ CLIKSEAL STRL BL (MISCELLANEOUS) ×2 IMPLANT
CORDS BIPOLAR (ELECTRODE) IMPLANT
COVER SURGICAL LIGHT HANDLE (MISCELLANEOUS) ×2 IMPLANT
DERMABOND ADVANCED (GAUZE/BANDAGES/DRESSINGS)
DERMABOND ADVANCED .7 DNX12 (GAUZE/BANDAGES/DRESSINGS) IMPLANT
DRAIN CHANNEL 15F RND FF W/TCR (WOUND CARE) ×6 IMPLANT
DRAPE PROXIMA HALF (DRAPES) IMPLANT
DRAPE WARM FLUID 44X44 (DRAPE) ×2 IMPLANT
ELECT BLADE 6.5 EXT (BLADE) ×2 IMPLANT
ELECT CAUTERY BLADE 6.4 (BLADE) ×2 IMPLANT
ELECT COATED BLADE 2.86 ST (ELECTRODE) ×2 IMPLANT
ELECT REM PT RETURN 9FT ADLT (ELECTROSURGICAL) ×2
ELECTRODE REM PT RTRN 9FT ADLT (ELECTROSURGICAL) ×1 IMPLANT
EVACUATOR SILICONE 100CC (DRAIN) ×6 IMPLANT
GAUZE SPONGE 4X4 12PLY STRL (GAUZE/BANDAGES/DRESSINGS) ×2 IMPLANT
GAUZE XEROFORM 1X8 LF (GAUZE/BANDAGES/DRESSINGS) ×2 IMPLANT
GEL ULTRASOUND 20GR AQUASONIC (MISCELLANEOUS) ×2 IMPLANT
GLOVE BIO SURGEON STRL SZ 6 (GLOVE) ×4 IMPLANT
GLOVE BIO SURGEON STRL SZ 6.5 (GLOVE) ×2 IMPLANT
GLOVE BIOGEL PI IND STRL 6.5 (GLOVE) ×2 IMPLANT
GLOVE BIOGEL PI IND STRL 7.0 (GLOVE) ×2 IMPLANT
GLOVE BIOGEL PI INDICATOR 6.5 (GLOVE) ×2
GLOVE BIOGEL PI INDICATOR 7.0 (GLOVE) ×2
GLOVE ECLIPSE 6.5 STRL STRAW (GLOVE) ×2 IMPLANT
GLOVE SURG SS PI 6.0 STRL IVOR (GLOVE) ×2 IMPLANT
GLOVE SURG SS PI 7.0 STRL IVOR (GLOVE) ×6 IMPLANT
GOWN STRL REUS W/ TWL LRG LVL3 (GOWN DISPOSABLE) ×2 IMPLANT
GOWN STRL REUS W/ TWL XL LVL3 (GOWN DISPOSABLE) ×3 IMPLANT
GOWN STRL REUS W/TWL LRG LVL3 (GOWN DISPOSABLE) ×2
GOWN STRL REUS W/TWL XL LVL3 (GOWN DISPOSABLE) ×3
IV CATH 22GX1 FEP (IV SOLUTION) ×2 IMPLANT
KIT BASIN OR (CUSTOM PROCEDURE TRAY) ×2 IMPLANT
LIQUID BAND (GAUZE/BANDAGES/DRESSINGS) ×8 IMPLANT
NEEDLE HYPO 25GX1X1/2 BEV (NEEDLE) ×2 IMPLANT
NS IRRIG 1000ML POUR BTL (IV SOLUTION) ×2 IMPLANT
PACK GENERAL/GYN (CUSTOM PROCEDURE TRAY) ×2 IMPLANT
PACK UNIVERSAL I (CUSTOM PROCEDURE TRAY) ×2 IMPLANT
PAD ABD 8X10 STRL (GAUZE/BANDAGES/DRESSINGS) ×6 IMPLANT
PAD ARMBOARD 7.5X6 YLW CONV (MISCELLANEOUS) ×4 IMPLANT
PIN SAFETY STERILE (MISCELLANEOUS) ×4 IMPLANT
PROBE PENCIL 8 MHZ STRL DISP (MISCELLANEOUS) ×2 IMPLANT
SPONGE LAP 18X18 X RAY DECT (DISPOSABLE) ×4 IMPLANT
STAPLER PROXIMATE 75MM BLUE (STAPLE) ×2 IMPLANT
STAPLER VISISTAT 35W (STAPLE) ×4 IMPLANT
SUT ETHILON 2 0 FS 18 (SUTURE) ×6 IMPLANT
SUT MNCRL AB 4-0 PS2 18 (SUTURE) ×10 IMPLANT
SUT MON AB 5-0 PS2 18 (SUTURE) IMPLANT
SUT PDS AB 0 CT 36 (SUTURE) ×8 IMPLANT
SUT PDS AB 2-0 CT1 27 (SUTURE) IMPLANT
SUT PDS AB 3-0 SH 27 (SUTURE) IMPLANT
SUT PDS II 0 TP-1 LOOPED 60 (SUTURE) ×2 IMPLANT
SUT PLAIN 5 0 P 3 18 (SUTURE) ×2 IMPLANT
SUT VIC AB 3-0 PS2 18 (SUTURE)
SUT VIC AB 3-0 PS2 18XBRD (SUTURE) IMPLANT
SUT VIC AB 3-0 SH 27 (SUTURE) ×1
SUT VIC AB 3-0 SH 27X BRD (SUTURE) ×1 IMPLANT
SUT VIC AB 4-0 PS2 27 (SUTURE) ×12 IMPLANT
SUT VLOC 180 0 24IN GS25 (SUTURE) ×4 IMPLANT
SYR BULB IRRIGATION 50ML (SYRINGE) ×2 IMPLANT
SYR CONTROL 10ML LL (SYRINGE) ×2 IMPLANT
TOWEL OR 17X24 6PK STRL BLUE (TOWEL DISPOSABLE) ×2 IMPLANT
TOWEL OR 17X26 10 PK STRL BLUE (TOWEL DISPOSABLE) ×2 IMPLANT
TRAY FOLEY CATH 14FRSI W/METER (CATHETERS) IMPLANT
TRAY FOLEY CATH 16FRSI W/METER (SET/KITS/TRAYS/PACK) ×2 IMPLANT
TUBE CONNECTING 12X1/4 (SUCTIONS) ×2 IMPLANT
YANKAUER SUCT BULB TIP NO VENT (SUCTIONS) ×2 IMPLANT

## 2013-12-30 NOTE — Anesthesia Postprocedure Evaluation (Signed)
  Anesthesia Post-op Note  Patient: Penny Howard  Procedure(s) Performed: Procedure(s): TRANSVERSE RECTUS ABDOMINIS MYOCUTANEOUS (TRAM) FLAP FOR RIGHT BREAST RECONSTRUCTION (Right)  Patient Location: PACU  Anesthesia Type:General  Level of Consciousness: awake, alert , oriented and patient cooperative  Airway and Oxygen Therapy: Patient Spontanous Breathing and Patient connected to nasal cannula oxygen  Post-op Pain: mild  Post-op Assessment: Post-op Vital signs reviewed, Patient's Cardiovascular Status Stable, Respiratory Function Stable, Patent Airway, No signs of Nausea or vomiting and Pain level controlled  Post-op Vital Signs: Reviewed and stable  Last Vitals:  Filed Vitals:   12/30/13 1641  BP: 140/77  Pulse: 111  Temp: 37.6 C  Resp: 18    Complications: No apparent anesthesia complications

## 2013-12-30 NOTE — Transfer of Care (Signed)
Immediate Anesthesia Transfer of Care Note  Patient: IISHA Howard  Procedure(s) Performed: Procedure(s): TRANSVERSE RECTUS ABDOMINIS MYOCUTANEOUS (TRAM) FLAP FOR RIGHT BREAST RECONSTRUCTION (Right)  Patient Location: PACU  Anesthesia Type:General  Level of Consciousness: awake, alert  and oriented  Airway & Oxygen Therapy: Patient connected to face mask oxygen  Post-op Assessment: Post -op Vital signs reviewed and stable  Post vital signs: stable  Complications: No apparent anesthesia complications

## 2013-12-30 NOTE — Op Note (Signed)
Operative Note   DATE OF OPERATION: 11.10.2015   LOCATION: [facility]- inpatient  SURGICAL DIVISION: Plastic Surgery  PREOPERATIVE DIAGNOSES:  1. History right breast cancer 2. Acquired absence right breast  POSTOPERATIVE DIAGNOSES:  same  PROCEDURE:  Transverse rectus abdominus myocutaneous flap for right breast recosntruction  SURGEON: Irene Limbo MD MBA  ASSISTANT: S. Rayburn PA-C  ANESTHESIA:  General.   EBL: 588 ml  COMPLICATIONS: None.   INDICATIONS FOR PROCEDURE:  The patient, Penny Howard, is a 68 y.o. female born on 02-13-1946, is here for breast reconstruction following mastectomy earlier this year.   FINDINGS: Ipsilateral based reconstruction with primary closure of fascia.   DESCRIPTION OF PROCEDURE:  Subcutaneous heparin was administered.The patient was taken to the operating room. SCDs were placed and IV antibiotics were given. The patient's operative site was prepped and draped in a sterile fashion. A time out was performed and all information was confirmed to be correct.Doppler was used to identify the periumbilcal perforators over right hemiabdomen. Incision then made through previous mastectomy scar and mastectomy flaps elevated off underlying pectoralis. The inframammary fold was transferred from left breast onto right chest wall. Dissection was carried inferiorly until reaching this desired IMF level. Subcutaneous tunnel developed into abdomen medially.   I then moved to abdomen and low transverse incision made. Superior extent for resection marked by palpation through skin flap that would allow tension free closure and included periumbilical perforators. This was at just superior to level of umbilicus. Umbilicus was released from skin flap and dissected with scissors to abdominal wall. Right hemiabdomen selected for flap and skin an subcutaneous tissue elevated off underlying abdominal wall fascia, progressing from lateral to midline. When lateral row of  rectus abdominus muscle perforators encountered, dissection stopped. Incision made in anterior sheath rectus fascia at lower edge of skin paddle. Muscle freed from sheath with blunt dissection. Inferior epigastric pedicle identified and ligated with hemoclips. Muscle divided with GIA 75 mm stapler caudally.  Incision made over left hemiabdomen to include ipsilateral medial and lateral zones in flap. 1-2 cm of of contralateral medial zone included and the remainder discarded. Incision made in anterior rectus fascia cephalic to skin paddle and muscle freed from fascia. Skin paddle and facsia secured to muscle with interrupted 3-0 vicryl suture. Muscle with skin paddle then placed through subcutaneous tunnel and rotated into breast. Abdomen irrigated thoroughly and hemostasis obtained. Two 15 Fr JP drains placed in subcutaneous position. Anterior sheeth fascia repaired primarily with 0-looped PDS suture and additional interrupted 0-PDS suture infigure of eight fashion. Superiorly based U shaped incision made at lever of anterior iliac spine and umbilicus delivered. Abdomen closed with interrupted 0 PDS suture for approximation of Scarps fascia and 2-0 V-lock suture for approximation dermis. Skin closure completed with 4-0 monocryl subcuticular. Umbilicus inset with interrupted 4-0 vicryl in dermis and running 5-0 plain gut for skin closure. All drains secured with 2-0 nylon.   Mastectomy cavity again irrigated and inspected for hemostasis prior to placement of drain. The flap was tailor tacked into position. Additional mastectomy skin excised to allow inset of flap at desired inframammary fold. Additional areas of flap marked for deepithelization. Following deepithelization, flap then positioned in right breast and secured to underlying pectoralis with interrupted 2-0 PDS suture. Closure with completed with4-0 vicryl in dermis and running 4-0 monocryl subcuticular. Dermabond applied to incisions and Xeroform  bolster placed in umbilicus.   Abdominal binder and dry dressing placed. The patient was allowed to wake from anesthesia,  extubated and taken to the recovery room in satisfactory condition.   SPECIMENS: right mastectomy scar  DRAINS: 15 Fr JP in right and left subcutaneous abdomen, 15 Fr JP in right reconstructed breast  Irene Limbo, MD [facility] Plastic & Reconstructive Surgery [phone]

## 2013-12-30 NOTE — Anesthesia Procedure Notes (Signed)
Procedure Name: Intubation Date/Time: 12/30/2013 10:46 AM Performed by: Maeola Harman Pre-anesthesia Checklist: Patient identified, Emergency Drugs available, Suction available, Patient being monitored and Timeout performed Patient Re-evaluated:Patient Re-evaluated prior to inductionOxygen Delivery Method: Circle system utilized Preoxygenation: Pre-oxygenation with 100% oxygen Intubation Type: IV induction Ventilation: Mask ventilation without difficulty Laryngoscope Size: Mac and 3 Grade View: Grade I Tube type: Oral Tube size: 7.0 mm Number of attempts: 1 Airway Equipment and Method: Stylet Placement Confirmation: ETT inserted through vocal cords under direct vision,  positive ETCO2 and breath sounds checked- equal and bilateral Secured at: 20 cm Tube secured with: Tape Dental Injury: Teeth and Oropharynx as per pre-operative assessment  Comments: Easy atraumatic induction and intubation with MAC 3 blade.  Dr. Glennon Mac verified placement of ETT.  Waldron Session, CRNA

## 2013-12-30 NOTE — Anesthesia Preprocedure Evaluation (Addendum)
Anesthesia Evaluation  Patient identified by MRN, date of birth, ID band Patient awake    Reviewed: Allergy & Precautions, H&P , NPO status , Patient's Chart, lab work & pertinent test results, reviewed documented beta blocker date and time   History of Anesthesia Complications Negative for: history of anesthetic complications  Airway Mallampati: I  TM Distance: >3 FB Neck ROM: Full    Dental  (+) Chipped, Dental Advisory Given   Pulmonary neg pulmonary ROS,  breath sounds clear to auscultation        Cardiovascular hypertension, Pt. on medications Rhythm:Regular Rate:Normal     Neuro/Psych Anxiety negative neurological ROS     GI/Hepatic Neg liver ROS, GERD-  Medicated and Controlled,  Endo/Other  negative endocrine ROS  Renal/GU negative Renal ROS     Musculoskeletal negative musculoskeletal ROS (+)   Abdominal   Peds  Hematology negative hematology ROS (+)   Anesthesia Other Findings Breast cancer  Reproductive/Obstetrics negative OB ROS                        Anesthesia Physical Anesthesia Plan  ASA: II  Anesthesia Plan: General   Post-op Pain Management:    Induction: Intravenous  Airway Management Planned: Oral ETT  Additional Equipment:   Intra-op Plan:   Post-operative Plan: Extubation in OR  Informed Consent: I have reviewed the patients History and Physical, chart, labs and discussed the procedure including the risks, benefits and alternatives for the proposed anesthesia with the patient or authorized representative who has indicated his/her understanding and acceptance.   Dental advisory given  Plan Discussed with: CRNA and Surgeon  Anesthesia Plan Comments: (Plan routine monitors, GETA)        Anesthesia Quick Evaluation

## 2013-12-30 NOTE — Interval H&P Note (Signed)
History and Physical Interval Note:  12/30/2013 7:12 AM  Penny Howard  has presented today for surgery, with the diagnosis of Personal history of malignant neoplasm of breast/Acquired absence of breast and nipple, right side  The various methods of treatment have been discussed with the patient and family. After consideration of risks, benefits and other options for treatment, the patient has consented to  Procedure(s): TRANSVERSE RECTUS ABDOMINIS MYOCUTANEOUS (TRAM) FLAP FOR RIGHT BREAST RECONSTRUCTION (Right) as a surgical intervention .  The patient's history has been reviewed, patient examined, no change in status, stable for surgery.  I have reviewed the patient's chart and labs.  Questions were answered to the patient's satisfaction.     Kryslyn Helbig

## 2013-12-31 ENCOUNTER — Encounter (HOSPITAL_COMMUNITY): Payer: Self-pay | Admitting: Plastic Surgery

## 2013-12-31 MED ORDER — SENNOSIDES-DOCUSATE SODIUM 8.6-50 MG PO TABS
1.0000 | ORAL_TABLET | Freq: Every day | ORAL | Status: DC
  Administered 2013-12-31: 1 via ORAL
  Filled 2013-12-31: qty 1

## 2013-12-31 MED ORDER — HYDROMORPHONE HCL 1 MG/ML IJ SOLN: 0.5000 mg | INTRAMUSCULAR | Status: DC | PRN

## 2013-12-31 NOTE — Progress Notes (Signed)
POD# 1 TRAM reconstruction  Reports PCA button did not work for some of night, also alarms going off Not OOB No nausea, tol diet  Temp:  [98.4 F (36.9 C)-99.6 F (37.6 C)] 98.5 F (36.9 C) (11/11 0516) Pulse Rate:  [74-111] 84 (11/11 0516) Resp:  [15-28] 24 (11/11 0734) BP: (94-163)/(52-86) 94/53 mmHg (11/11 0516) SpO2:  [91 %-100 %] 96 % (11/11 0734) Weight:  [60.691 kg (133 lb 12.8 oz)] 60.691 kg (133 lb 12.8 oz) (11/10 0954)   JP 5/40/90  PE:  Abdomen incisions intact, some drainage from umbilicus which is viable Breast flap soft, not congested Binder over inferior flap   A/P D/c Foley, PT eval- keep bent at hips with ambulation Cut binder- no pressure on flap D/c PCA, minimal use  Irene Limbo, MD [facility] Plastic & Reconstructive Surgery [phone]

## 2014-01-01 MED ORDER — HYDROCODONE-ACETAMINOPHEN 5-325 MG PO TABS: 1.0000 | ORAL_TABLET | ORAL | Status: DC | PRN

## 2014-01-01 MED ORDER — POLYETHYLENE GLYCOL 3350 17 G PO PACK
17.0000 g | PACK | Freq: Every day | ORAL | Status: DC | PRN
  Administered 2014-01-01: 17 g via ORAL
  Filled 2014-01-01: qty 1

## 2014-01-01 MED ORDER — SENNOSIDES-DOCUSATE SODIUM 8.6-50 MG PO TABS: 1.0000 | ORAL_TABLET | Freq: Every day | ORAL | Status: DC

## 2014-01-01 NOTE — Progress Notes (Signed)
POD# 2 TRAM reconstruction  OOB in room, no BM Pain with cough  Temp:  [98.2 F (36.8 C)-99.3 F (37.4 C)] 98.6 F (37 C) (11/12 0455) Pulse Rate:  [80-89] 89 (11/12 0455) Resp:  [18-20] 18 (11/12 0455) BP: (98-151)/(45-79) 151/79 mmHg (11/12 0455) SpO2:  [92 %-96 %] 92 % (11/12 0455)   JP 25/70/80  PE:  Abdomen incisions intact, some drainage from umbilicus which is viable Breast flap soft, more edema, viable Abdomen incision intact   A/P  PT eval- keep bent at hips with ambulation, needs to ambulate in halls prior to home Bowel regimen- no BM x 4 days, add Miralax today and instructed on home use Shower with assist today Home late today or in am if able to ambulate well  Irene Limbo, MD [facility] Plastic & Reconstructive Surgery [phone]

## 2014-01-01 NOTE — Care Management Note (Signed)
  Page 1 of 1   01/01/2014     2:40:19 PM CARE MANAGEMENT NOTE 01/01/2014  Patient:  Penny Howard, Penny Howard   Account Number:  [id number]  Date Initiated:  01/01/2014  Documentation initiated by:  Magdalen Spatz  Subjective/Objective Assessment:     Action/Plan:   Anticipated DC Date:     Anticipated DC Plan:  HOME/SELF CARE         Choice offered to / List presented to:             Status of service:   Medicare Important Message given?  YES (If response is "NO", the following Medicare IM given date fields will be blank) Date Medicare IM given:  01/01/2014 Medicare IM given by:  Magdalen Spatz Date Additional Medicare IM given:   Additional Medicare IM given by:    Discharge Disposition:    Per UR Regulation:    If discussed at Long Length of Stay Meetings, dates discussed:    Comments:

## 2014-01-01 NOTE — Progress Notes (Signed)
Penny Howard to be D/C'd Home per MD order.  Discussed with the patient and all questions fully answered.  VSS, Surgical sites clean, dry, intact with no sign of infection.  ABD pads and abdominal binder in place. IV catheter discontinued intact. Site without signs and symptoms of complications. Dressing and pressure applied.  An After Visit Summary was printed and given to the patient. Patient received prescription.  D/c education completed with patient/family including follow up instructions, medication list, d/c activities limitations if indicated, with other d/c instructions as indicated by MD - patient able to verbalize understanding, all questions fully answered.   Patient instructed to return to ED, call 911, or call MD for any changes in condition.   Patient escorted via [place], and D/C home via private auto.  [name] 01/01/2014 2:29 PM

## 2014-01-01 NOTE — Evaluation (Signed)
Physical Therapy Evaluation Patient Details Name: Penny Howard MRN: [id number] DOB: 1945/09/29 Today's Date: 01/01/2014   History of Present Illness  Patient is a 68 y/o female s/p TRAM reconstruction POD 2. PMH of HTN, SUI, anxiety, osteoporosis and cancer.  Clinical Impression  Patient presents with mild burning pain at incision site upon standing however tolerated ambulating community distances and negotiating steps without difficulty. Able to maintain flexed hips during gait and refrain from standing upright following precautions appropriately. Pt does not require skilled therapy services as pt able to ambulate with supervision from husband and has the necessary assist at home as needed. Encourage ambulation in hallway daily with supervision while in hospital. Discharge from therapy.    Follow Up Recommendations No PT follow up;Supervision - Intermittent    Equipment Recommendations  None recommended by PT    Recommendations for Other Services       Precautions / Restrictions Precautions Precaution Comments: "keep hips bent when ambulating, do not stand straight up" Required Braces or Orthoses: Other Brace/Splint Other Brace/Splint: abdominal binder Restrictions Weight Bearing Restrictions: No      Mobility  Bed Mobility               General bed mobility comments: Received sitting in chair upon PT arrival.   Transfers Overall transfer level: Independent Equipment used: None Transfers: Sit to/from Stand              Ambulation/Gait Ambulation/Gait assistance: Modified independent (Device/Increase time) Ambulation Distance (Feet): 200 Feet Assistive device: None Gait Pattern/deviations: Step-through pattern;Decreased stride length;Trunk flexed Gait velocity: slow   General Gait Details: Pt with slow steady gait, no LOB. Able to adhere to precautions of maintaining hips flexed and not standing straight up.  Stairs Stairs: Yes Stairs assistance:  Supervision Stair Management: One rail Right;Alternating pattern Number of Stairs: 2 General stair comments: Supervision for safety.  Wheelchair Mobility    Modified Rankin (Stroke Patients Only)       Balance Overall balance assessment: Needs assistance Sitting-balance support: Feet supported;No upper extremity supported Sitting balance-Leahy Scale: Good     Standing balance support: During functional activity Standing balance-Leahy Scale: Fair                               Pertinent Vitals/Pain Pain Assessment: 0-10 Pain Score:  (not rated on pain scale) Pain Location: incision site Pain Descriptors / Indicators: Burning Pain Intervention(s): Monitored during session;Repositioned;Premedicated before session    Home Living Family/patient expects to be discharged to:: Private residence Living Arrangements: Spouse/significant other Available Help at Discharge: Family;Available 24 hours/day Type of Home: House Home Access: Stairs to enter Entrance Stairs-Rails: Right Entrance Stairs-Number of Steps: 2 Home Layout: One level Home Equipment: Walker - 2 wheels;Wheelchair - manual;Shower seat      Prior Function Level of Independence: Independent               Hand Dominance        Extremity/Trunk Assessment   Upper Extremity Assessment: Overall WFL for tasks assessed (Appears WFL, Not formally assessed due to recent surgery and precautions.)           Lower Extremity Assessment: Overall WFL for tasks assessed         Communication   Communication: No difficulties  Cognition Arousal/Alertness: Awake/alert Behavior During Therapy: WFL for tasks assessed/performed Overall Cognitive Status: Within Functional Limits for tasks assessed  General Comments General comments (skin integrity, edema, etc.): JP drains in tact. Abdominal binder readjusted.    Exercises        Assessment/Plan    PT Assessment  Patent does not need any further PT services  PT Diagnosis Acute pain   PT Problem List    PT Treatment Interventions     PT Goals (Current goals can be found in the Care Plan section) Acute Rehab PT Goals PT Goal Formulation: All assessment and education complete, DC therapy    Frequency     Barriers to discharge        Co-evaluation               End of Session Equipment Utilized During Treatment: Other (comment) (Abdominal binder) Activity Tolerance: Patient tolerated treatment well Patient left: in chair;with call bell/phone within reach;with family/visitor present Nurse Communication: Mobility status;Precautions         Time: 1055-1110 PT Time Calculation (min) (ACUTE ONLY): 15 min   Charges:   PT Evaluation $Initial PT Evaluation Tier I: 1 Procedure PT Treatments $Gait Training: 8-22 mins   PT G CodesCandy Sledge A 01/01/2014, 11:24 AM Candy Sledge, PT, DPT [phone]

## 2014-01-01 NOTE — Discharge Summary (Signed)
Physician Discharge Summary  Patient ID: Penny Howard MRN: [id number] DOB/AGE: 08-09-1945 68 y.o.  Admit date: 12/30/2013 Discharge date: 01/01/2014  Admission Diagnoses: Acquired absence of breast and absent nipple, history right breast cancer  Discharge Diagnoses:  Active Problems:   Acquired absence of breast and absent nipple   Discharged Condition: stable  Hospital Course: Post operatively able to transition from PCA to oral meds by POD #2 . Able to ambulate in halls with minimal assist. Maintained on perioperative antbiotics by IV.  Consults: None  Significant Diagnostic Studies: none  Treatments: surgery: TRAM flap for breast reconstruction  Discharge Exam: Blood pressure 143/70, pulse 88, temperature 99 F (37.2 C), temperature source Oral, resp. rate 16, height 4' 11"  (1.499 m), weight 60.691 kg (133 lb 12.8 oz), SpO2 95 %. Incision/Wound: intact, flap soft and viable, umbilicus viable.  Disposition: 01-Home or Self Care  Discharge Instructions    Call MD for:  redness, tenderness, or signs of infection (pain, swelling, bleeding, redness, odor or green/yellow discharge around incision site)    Complete by:  As directed      Call MD for:  severe or increased pain, loss or decreased feeling  in affected limb(s)    Complete by:  As directed      Discharge instructions    Complete by:  As directed   Ok to shower. Pat incisions dry. Abdominal binder all times.  Walk bent at hip. Keep pillow beneath knees and two pillows behind head while sleeping.  Strip and record drains twice daily. Bring record to clinic visit.     Driving Restrictions    Complete by:  As directed   No driving.     Lifting restrictions    Complete by:  As directed   No lifting greater than 5 lbs     Resume previous diet    Complete by:  As directed             Medication List    TAKE these medications        escitalopram 10 MG tablet  Commonly known as:  LEXAPRO  Take 10 mg by  mouth daily.     esomeprazole 20 MG capsule  Commonly known as:  NEXIUM  Take 20 mg by mouth daily at 12 noon.     HYDROcodone-acetaminophen 5-325 MG per tablet  Commonly known as:  NORCO/VICODIN  Take 1-2 tablets by mouth every 4 (four) hours as needed for moderate pain.     lisinopril 10 MG tablet  Commonly known as:  PRINIVIL,ZESTRIL  Take 10 mg by mouth daily.     multivitamin-lutein Caps capsule  Take 1 capsule by mouth daily. 250 mg capsule     Red Yeast Rice 600 MG Caps  Take 600 mg by mouth daily.     risedronate 150 MG tablet  Commonly known as:  ACTONEL  Take 150 mg by mouth every 30 (thirty) days. with water on empty stomach, nothing by mouth or lie down for next 30 minutes.     senna-docusate 8.6-50 MG per tablet  Commonly known as:  Senokot-S  Take 1 tablet by mouth at bedtime.     tamoxifen 20 MG tablet  Commonly known as:  NOLVADEX  Take 1 tablet (20 mg total) by mouth daily.     UNABLE TO FIND  V2536- Post Mastectomy Camisole-2     UNABLE TO FIND  - Right Partial Mastectomy 174.9  -   - L8000- Post Surgical Bras-6  -  W4986- Non-Silicone Breast Prosthesis- 1           Follow-up Information    Follow up with Romy Mcgue, MD In 1 week.   Specialty:  Plastic Surgery   Why:  as scheduled   Contact information:   [address] [place] [address] [phone]       Signed: Irene Limbo 01/01/2014, 3:40 PM

## 2014-02-11 ENCOUNTER — Other Ambulatory Visit: Payer: Self-pay | Admitting: Emergency Medicine

## 2014-02-11 ENCOUNTER — Telehealth: Payer: Self-pay | Admitting: Oncology

## 2014-02-11 ENCOUNTER — Ambulatory Visit (HOSPITAL_BASED_OUTPATIENT_CLINIC_OR_DEPARTMENT_OTHER): Payer: Medicare Other | Admitting: Oncology

## 2014-02-11 ENCOUNTER — Ambulatory Visit (HOSPITAL_BASED_OUTPATIENT_CLINIC_OR_DEPARTMENT_OTHER): Payer: Medicare Other

## 2014-02-11 VITALS — BP 131/70 | HR 82 | Temp 98.2°F | Resp 18 | Ht 59.0 in | Wt 129.4 lb

## 2014-02-11 DIAGNOSIS — C50411 Malignant neoplasm of upper-outer quadrant of right female breast: Secondary | ICD-10-CM

## 2014-02-11 DIAGNOSIS — Z170 Estrogen receptor positive status [ER+]: Secondary | ICD-10-CM

## 2014-02-11 DIAGNOSIS — C50811 Malignant neoplasm of overlapping sites of right female breast: Secondary | ICD-10-CM

## 2014-02-11 DIAGNOSIS — Z9011 Acquired absence of right breast and nipple: Secondary | ICD-10-CM

## 2014-02-11 LAB — CBC WITH DIFFERENTIAL/PLATELET
BASO%: 0.8 % (ref 0.0–2.0)
Basophils Absolute: 0.1 10*3/uL (ref 0.0–0.1)
EOS ABS: 0.1 10*3/uL (ref 0.0–0.5)
EOS%: 1.8 % (ref 0.0–7.0)
HEMATOCRIT: 39 % (ref 34.8–46.6)
HGB: 12.2 g/dL (ref 11.6–15.9)
LYMPH%: 32.2 % (ref 14.0–49.7)
MCH: 30.6 pg (ref 25.1–34.0)
MCHC: 31.3 g/dL — ABNORMAL LOW (ref 31.5–36.0)
MCV: 97.7 fL (ref 79.5–101.0)
MONO#: 0.5 10*3/uL (ref 0.1–0.9)
MONO%: 8.9 % (ref 0.0–14.0)
NEUT%: 56.3 % (ref 38.4–76.8)
NEUTROS ABS: 3.4 10*3/uL (ref 1.5–6.5)
PLATELETS: 311 10*3/uL (ref 145–400)
RBC: 3.99 10*6/uL (ref 3.70–5.45)
RDW: 13.2 % (ref 11.2–14.5)
WBC: 6 10*3/uL (ref 3.9–10.3)
lymph#: 1.9 10*3/uL (ref 0.9–3.3)

## 2014-02-11 LAB — COMPREHENSIVE METABOLIC PANEL (CC13)
ALBUMIN: 4.2 g/dL (ref 3.5–5.0)
ALT: 30 U/L (ref 0–55)
ANION GAP: 11 meq/L (ref 3–11)
AST: 29 U/L (ref 5–34)
Alkaline Phosphatase: 52 U/L (ref 40–150)
BILIRUBIN TOTAL: 0.28 mg/dL (ref 0.20–1.20)
BUN: 15.9 mg/dL (ref 7.0–26.0)
CALCIUM: 9.4 mg/dL (ref 8.4–10.4)
CO2: 23 meq/L (ref 22–29)
Chloride: 107 mEq/L (ref 98–109)
Creatinine: 0.8 mg/dL (ref 0.6–1.1)
EGFR: 82 mL/min/{1.73_m2} — AB (ref >90)
GLUCOSE: 94 mg/dL (ref 70–140)
Potassium: 4 mEq/L (ref 3.5–5.1)
Sodium: 140 mEq/L (ref 136–145)
Total Protein: 7.2 g/dL (ref 6.4–8.3)

## 2014-02-11 NOTE — Progress Notes (Signed)
[facility]  [phone] [phone]     ID: Charnee T Sinopoli OB: 66/10/1945  MR#: [id number]  CSN#:[id number]  PCP: SPEAR, TAMMY, MD GYN:   SU: Benjamin Hoxworth OTHER MD: John Moody, Brinda Thimmappa  CHIEF COMPLAINT: Estrogen receptor positive breast cancer CURRENT TREATMENT: Tamoxifen   BREAST CANCER HISTORY: From the original intake note:  Kendy was bending over to get into her hot tub when she noted a mass in her right breast. She intended to keep it to herself, but her husband of 47 years, [name], noted her changed expression, asked her what it was about, and when she told him they got out of the top and called on her primary care physician. On 06/02/2013 the patient underwent right breast mammography and ultrasonography,  which showed an area of increased density in the superior aspect of the right breast, which was palpable and firm. By palpation it measured approximately 5 cm. Ultrasound located an irregular mass in the right breast at the 12:00 position measuring 4 cm maximally. Ultrasound of the right axilla showed normal axillary contents.  Biopsy of the right breast mass in question 06/02/2013 ([id number]) confirmed an invasive lobular tumor, grade 2, estrogen and progesterone receptors both 100% positive in both with strong staining intensity, with an MIB-1 of 80%. HER-2 is pending.  On 06/03/2013 the patient underwent left breast mammography, which was negative. On 06/09/2013 the patient underwent bilateral breast MRI. This showed, in the left breast, multiple enhancing nodules in the central portion measuring up to 4.6 cm in aggregate. There was evidence of nipple retraction. The largest single  discrete mass measured 2.7 cm. There was no involvement of the pectoralis and no abnormal appearing lymph nodes. The right breast was unremarkable.  The patient's subsequent history is as detailed below  INTERVAL HISTORY: Keyarah returns today for  followup of her breast cancer since her last visit here she underwent her right breast TRAM reconstructions. She gives it "a 10". She is delighted that it went so well. She is only just beginning to exercise, chiefly by walking. . REVIEW OF SYSTEMS: She is tolerating tamoxifen with "absolutely no side effects". She obtains it for $3 a month. A detailed review of systems today was otherwise negative.   PAST MEDICAL HISTORY: Past Medical History  Diagnosis Date  . Hypertension   . GERD (gastroesophageal reflux disease)   . High cholesterol   . Anxiety   . Cancer     breast rt  . Stress incontinence   . Allergy   . Osteoporosis     PAST SURGICAL HISTORY: Past Surgical History  Procedure Laterality Date  . Bladder surgery  10    mesh  . Mastectomy w/ sentinel node biopsy  07/04/2013    RT TOTAL            DR HOXWORTH  . Simple mastectomy with axillary sentinel node biopsy Right 07/04/2013    Procedure: RIGHT TOTAL MASTECTOMY WITH RIGHT  AXILLARY SENTINEL LYMPH NODE BIOPSY;  Surgeon: Benjamin T Hoxworth, MD;  Location: [facility] OR;  Service: General;  Laterality: Right;  . Abdominal hysterectomy  1990    ovaries intact b/l  . Tram Right 12/30/2013    Procedure: TRANSVERSE RECTUS ABDOMINIS MYOCUTANEOUS (TRAM) FLAP FOR RIGHT BREAST RECONSTRUCTION;  Surgeon: Brinda Thimmappa, MD;  Location: [facility] OR;  Service: Plastics;  Laterality: Right;    FAMILY HISTORY Family History  Problem Relation Age of Onset  . Breast cancer Mother   . Breast   cancer Paternal Aunt   . Lymphoma Cousin     non- hodgkins lymphoma   the patient's father died with congestive heart failure at the age of 66. The patient's mother died from "multiple causes" at the age of 66. Gail had one brother, no sisters. The patient's mother was diagnosed with breast cancer the age of 66 there is also a paternal aunt diagnosed with breast cancer at the age of 66.  GYNECOLOGIC HISTORY:  Menarche age 16, first live birth age 22, the  patient is GX P2. She stopped having periods approximately 1990. She did not take hormone replacement. 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 Her husband Joseph (goes by "choked") used to work installing service stations. Their name is pronounced "Nam-zura"--it is of [place] origin. Daughter Christy lives in [place] for she is a senior [employer] and daughter Julie lives in [place] where she is a housewife. The patient has 1 grandchild. She attends a [employer].    ADVANCED DIRECTIVES: Not in place   HEALTH MAINTENANCE: History  Substance Use Topics  . Smoking status: Never Smoker   . Smokeless tobacco: Never Used  . Alcohol Use: 3.0 oz/week    5 Glasses of wine per week     Comment: occaionally     Colonoscopy: 2001?/Dr. Mann  PAP:  Bone density: 2014/osteopenia  Lipid panel:  Allergies  Allergen Reactions  . Codeine Nausea Only    Current Outpatient Prescriptions  Medication Sig Dispense Refill  . escitalopram (LEXAPRO) 10 MG tablet Take 10 mg by mouth daily.    . esomeprazole (NEXIUM) 20 MG capsule Take 20 mg by mouth daily at 12 noon.    . HYDROcodone-acetaminophen (NORCO/VICODIN) 5-325 MG per tablet Take 1-2 tablets by mouth every 4 (four) hours as needed for moderate pain. 50 tablet 0  . lisinopril (PRINIVIL,ZESTRIL) 10 MG tablet Take 10 mg by mouth daily.    . multivitamin-lutein (OCUVITE-LUTEIN) CAPS capsule Take 1 capsule by mouth daily. 250 mg capsule    . Red Yeast Rice 600 MG CAPS Take 600 mg by mouth daily.    . risedronate (ACTONEL) 150 MG tablet Take 150 mg by mouth every 30 (thirty) days. with water on empty stomach, nothing by mouth or lie down for next 30 minutes.    . senna-docusate (SENOKOT-S) 8.6-50 MG per tablet Take 1 tablet by mouth at bedtime. 30 tablet 0  . tamoxifen (NOLVADEX) 20 MG tablet Take 1 tablet (20 mg total)  by mouth daily. 90 tablet 4  . UNABLE TO FIND L8015- Post Mastectomy Camisole-2 1 each 0  . UNABLE TO FIND Right Partial Mastectomy 174.9  L8000- Post Surgical Bras-6 L8020- Non-Silicone Breast Prosthesis- 1 1 each 0   No current facility-administered medications for this visit.    OBJECTIVE: Middle-aged white woman who appears well  Filed Vitals:   02/11/14 1520  BP: 131/70  Pulse: 82  Temp: 98.2 F (36.8 C)  Resp: 18     Body mass index is 26.12 kg/(m^2).    ECOG FS:0 - Asymptomatic  Sclerae unicteric, pupils equal and reactive Oropharynx clear and moist No cervical or supraclavicular adenopathy Lungs no rales or rhonchi Heart regular rate and rhythm Abd soft, nontender, positive bowel sounds MSK no focal spinal tenderness, no upper extremity lymphedema Neuro: nonfocal, well oriented, positive affect Breasts: The right breast is status post mastectomy and TRAM reconstruction.   There is no evidence of local recurrence. The right axilla is benign. The left breast is unremarkable.  LAB RESULTS:  CMP     Component Value Date/Time   NA 139 12/23/2013 0828   NA 143 06/11/2013 1306   K 4.1 12/23/2013 0828   K 4.0 06/11/2013 1306   CL 103 12/23/2013 0828   CO2 22 12/23/2013 0828   CO2 19* 06/11/2013 1306   GLUCOSE 101* 12/23/2013 0828   GLUCOSE 107 06/11/2013 1306   BUN 12 12/23/2013 0828   BUN 11.0 06/11/2013 1306   CREATININE 0.59 12/23/2013 0828   CREATININE 0.7 06/11/2013 1306   CALCIUM 9.4 12/23/2013 0828   CALCIUM 10.0 06/11/2013 1306   PROT 7.5 06/11/2013 1306   ALBUMIN 4.1 06/11/2013 1306   AST 24 06/11/2013 1306   ALT 25 06/11/2013 1306   ALKPHOS 64 06/11/2013 1306   BILITOT 0.26 06/11/2013 1306   GFRNONAA >90 12/23/2013 0828   GFRAA >90 12/23/2013 0828    I No results found for: SPEP  Lab Results  Component Value Date   WBC 6.0 02/11/2014   NEUTROABS 3.4 02/11/2014   HGB 12.2 02/11/2014   HCT 39.0 02/11/2014   MCV 97.7 02/11/2014   PLT 311  02/11/2014      Chemistry      Component Value Date/Time   NA 139 12/23/2013 0828   NA 143 06/11/2013 1306   K 4.1 12/23/2013 0828   K 4.0 06/11/2013 1306   CL 103 12/23/2013 0828   CO2 22 12/23/2013 0828   CO2 19* 06/11/2013 1306   BUN 12 12/23/2013 0828   BUN 11.0 06/11/2013 1306   CREATININE 0.59 12/23/2013 0828   CREATININE 0.7 06/11/2013 1306      Component Value Date/Time   CALCIUM 9.4 12/23/2013 0828   CALCIUM 10.0 06/11/2013 1306   ALKPHOS 64 06/11/2013 1306   AST 24 06/11/2013 1306   ALT 25 06/11/2013 1306   BILITOT 0.26 06/11/2013 1306       No results found for: LABCA2  No components found for: LABCA125  No results for input(s): INR in the last 168 hours.  Urinalysis No results found for: COLORURINE  STUDIES: No results found.   ASSESSMENT: 68 y.o. [place] woman status post right breast biopsy 06/02/2013 for a clinical T2 N0, stage IIA invasive lobular breast cancer, grade 2, estrogen receptor and progesterone receptor positive, with an MIB-1 of 80%, HER-2 obtained from definitive surgery  (1) status post right mastectomy and sentinel lymph node sampling 07/04/2013 for a pT3, pN0, stage IIB invasive lobular carcinoma, grade 2, HER-2 negative  (2) Oncotype score of 6 predicts a risk of outside the breast recurrence within 10 years and 5% of the patient's only systemic treatment is tamoxifen for 5 years. It also predicts no benefit from chemotherapy  (3) tamoxifen started 09/15/2013  (4) underwent TRAM flap reconstruction 12/30/2013  PLAN: Brit is doing fine from a breast cancer point of view and has an excellent prognosis overall. The plan is to continue tamoxifen for 5 years, and then reassess whether she wants to continue for 10, switched to an aromatase inhibitor for 2 years, or simply stop.  She is concerned about her daughters, who are in their mid 33s. I suggested they get the 3-D mammography together with mammography, but I discouraged MRIs  since the rate of false positives is likely to be high in their case. I also suggested they explore the "Baker Janus model" tool, to assess the risk, which although  higher then the baseline  population risk nevertheless should be lower than they likely anticipate  Gail see me again in May, and then in November 2016. She has a good understanding of the overall plan. She agrees with it. She knows that the goal of treatment in her case is cure. She will call with any problems that may develop before her next visit here.   MAGRINAT,GUSTAV C, MD   02/11/2014 3:45 PM 

## 2014-02-11 NOTE — Telephone Encounter (Signed)
, °

## 2014-02-12 NOTE — Addendum Note (Signed)
Addended by: Laureen Abrahams on: 02/12/2014 01:54 PM   Modules accepted: Orders, Medications

## 2014-03-03 DIAGNOSIS — I10 Essential (primary) hypertension: Secondary | ICD-10-CM | POA: Diagnosis not present

## 2014-03-03 DIAGNOSIS — F411 Generalized anxiety disorder: Secondary | ICD-10-CM | POA: Diagnosis not present

## 2014-03-03 DIAGNOSIS — M810 Age-related osteoporosis without current pathological fracture: Secondary | ICD-10-CM | POA: Diagnosis not present

## 2014-03-03 DIAGNOSIS — E782 Mixed hyperlipidemia: Secondary | ICD-10-CM | POA: Diagnosis not present

## 2014-04-16 ENCOUNTER — Encounter (HOSPITAL_BASED_OUTPATIENT_CLINIC_OR_DEPARTMENT_OTHER)
Admission: RE | Admit: 2014-04-16 | Discharge: 2014-04-16 | Disposition: A | Discharge status: Home / Self Care | Payer: Medicare Other | Source: Ambulatory Visit | Attending: Plastic Surgery | Admitting: Plastic Surgery

## 2014-04-16 ENCOUNTER — Encounter (HOSPITAL_BASED_OUTPATIENT_CLINIC_OR_DEPARTMENT_OTHER): Payer: Self-pay | Admitting: *Deleted

## 2014-04-16 DIAGNOSIS — Z853 Personal history of malignant neoplasm of breast: Secondary | ICD-10-CM | POA: Insufficient documentation

## 2014-04-16 DIAGNOSIS — Z01818 Encounter for other preprocedural examination: Secondary | ICD-10-CM | POA: Insufficient documentation

## 2014-04-16 DIAGNOSIS — Z9011 Acquired absence of right breast and nipple: Secondary | ICD-10-CM | POA: Insufficient documentation

## 2014-04-16 LAB — BASIC METABOLIC PANEL
Anion gap: 5 (ref 5–15)
BUN: 11 mg/dL (ref 6–23)
CHLORIDE: 108 mmol/L (ref 96–112)
CO2: 23 mmol/L (ref 19–32)
Calcium: 9 mg/dL (ref 8.4–10.5)
Creatinine, Ser: 0.66 mg/dL (ref 0.50–1.10)
GFR calc Af Amer: >90 mL/min (ref >90)
Glucose, Bld: 101 mg/dL — ABNORMAL HIGH (ref 70–99)
POTASSIUM: 4.3 mmol/L (ref 3.5–5.1)
SODIUM: 136 mmol/L (ref 135–145)

## 2014-04-16 NOTE — Progress Notes (Signed)
Came in for bmet-going out of town-all preop reviewed

## 2014-04-21 ENCOUNTER — Encounter (HOSPITAL_BASED_OUTPATIENT_CLINIC_OR_DEPARTMENT_OTHER): Admission: RE | Admit: 2014-04-21 | Payer: Medicare Other | Source: Ambulatory Visit

## 2014-04-21 NOTE — H&P (Signed)
  Subjective:   Patient ID: Penny Howard is a 69 y.o. female.  Nearly 3 months post op TRAM reconstruction, delayed following right mastectomy. Here for nipple creation and additional revisions.  Presented with palpabe right breast mass and MMG showed density in the superior and subareolar portion of the right breast 12:00 position and ultrasound showing a heterogeneous irregular mass at the 12:00 position and retroareolar measuring 4 cm. Post operative from right mastectomy with SLN demonstrating 4.2 cm area of invasive lobular carcinoma with both sentinel lymph nodes clear lobular carcinoma of the breast ER/PR+, Her 2 neg. No adjuvant XRT recommended. Low oncotype score; hormonal treatment recommended.   Prior 36 C  Review of Systems  Objective:   Physical Exam  Chest right flap soft, firm superior pole with abrupt step off, redundant skinin area medial to skin paddle inset Abdomen with fullness near IMF, small dog ear lateral extent incisions left side Left breast without masses SN to nipple L 23  BW L 17 Nipple to IMF L 17.5 cm  CV regular rate, normal heart sounds PULM : clear to ausculatation Assessment:   S/p TRAM reconstruction Hx R breast cancer  Plan:    Overall volume symmetric in bra. Will plan revision to TRAM namely define IMF fold better and remove redundant skin medially. This may extend scar into area visible in clothing or bathing suit. Pt understands this. Rec lipofilling from flanks to right superior pole to aid with contour/correct step off. Reviewed variable take of fat grafting and may want to repeat. Reviewed pain abdomen and need for compression garment post op. Has some fat necrosis in this area which I counseled pt is not dangerous but may never resolve. Pt not bothered by this to want intervention.  Will correct left hemiabdomen dog ear.  Discussed options for NAC reconstruction inc 3d tattoo vs tattoo alone vs local flap with tattoo vs local flap  with FTSG from groin. Reviewed pictures of each. Plan local flap with FTSG. Reviewed post op bolster, bathing restrictions.  Will plan OP surgery. 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  Irene Limbo, MD [facility] Plastic & Reconstructive Surgery [phone]  Irene Limbo, MD [facility] Plastic & Reconstructive Surgery [phone]

## 2014-04-24 ENCOUNTER — Ambulatory Visit (HOSPITAL_BASED_OUTPATIENT_CLINIC_OR_DEPARTMENT_OTHER)
Admission: RE | Admit: 2014-04-24 | Discharge: 2014-04-24 | Disposition: A | Discharge status: Home / Self Care | Payer: Medicare Other | Source: Ambulatory Visit | Attending: Plastic Surgery | Admitting: Plastic Surgery

## 2014-04-24 ENCOUNTER — Ambulatory Visit (HOSPITAL_BASED_OUTPATIENT_CLINIC_OR_DEPARTMENT_OTHER): Payer: Medicare Other | Admitting: Certified Registered"

## 2014-04-24 ENCOUNTER — Encounter (HOSPITAL_BASED_OUTPATIENT_CLINIC_OR_DEPARTMENT_OTHER): Payer: Self-pay | Admitting: *Deleted

## 2014-04-24 ENCOUNTER — Encounter (HOSPITAL_BASED_OUTPATIENT_CLINIC_OR_DEPARTMENT_OTHER)
Admission: RE | Disposition: A | Discharge status: Home / Self Care | Payer: Self-pay | Source: Ambulatory Visit | Attending: Plastic Surgery

## 2014-04-24 DIAGNOSIS — K219 Gastro-esophageal reflux disease without esophagitis: Secondary | ICD-10-CM | POA: Diagnosis not present

## 2014-04-24 DIAGNOSIS — Z9011 Acquired absence of right breast and nipple: Secondary | ICD-10-CM | POA: Diagnosis not present

## 2014-04-24 DIAGNOSIS — F419 Anxiety disorder, unspecified: Secondary | ICD-10-CM | POA: Diagnosis not present

## 2014-04-24 DIAGNOSIS — Z853 Personal history of malignant neoplasm of breast: Secondary | ICD-10-CM | POA: Insufficient documentation

## 2014-04-24 DIAGNOSIS — I10 Essential (primary) hypertension: Secondary | ICD-10-CM | POA: Diagnosis not present

## 2014-04-24 DIAGNOSIS — N651 Disproportion of reconstructed breast: Secondary | ICD-10-CM | POA: Diagnosis not present

## 2014-04-24 DIAGNOSIS — L905 Scar conditions and fibrosis of skin: Secondary | ICD-10-CM | POA: Diagnosis not present

## 2014-04-24 HISTORY — DX: Presence of spectacles and contact lenses: Z97.3

## 2014-04-24 HISTORY — PX: SCAR REVISION: SHX5285

## 2014-04-24 HISTORY — PX: LIPOSUCTION WITH LIPOFILLING: SHX6436

## 2014-04-24 HISTORY — PX: SKIN FULL THICKNESS GRAFT: SHX442

## 2014-04-24 LAB — POCT HEMOGLOBIN-HEMACUE: Hemoglobin: 13.4 g/dL (ref 12.0–15.0)

## 2014-04-24 SURGERY — LIPOSUCTION, WITH FAT TRANSFER
Anesthesia: General | Site: Breast | Laterality: Right

## 2014-04-24 MED ORDER — FENTANYL CITRATE 0.05 MG/ML IJ SOLN
INTRAMUSCULAR | Status: AC
  Filled 2014-04-24: qty 8

## 2014-04-24 MED ORDER — LIDOCAINE HCL (CARDIAC) 20 MG/ML IV SOLN
INTRAVENOUS | Status: DC | PRN
  Administered 2014-04-24: 60 mg via INTRAVENOUS

## 2014-04-24 MED ORDER — DEXAMETHASONE SODIUM PHOSPHATE 4 MG/ML IJ SOLN
INTRAMUSCULAR | Status: DC | PRN
  Administered 2014-04-24: 10 mg via INTRAVENOUS

## 2014-04-24 MED ORDER — CEFAZOLIN SODIUM-DEXTROSE 2-3 GM-% IV SOLR
2.0000 g | INTRAVENOUS | Status: AC
  Administered 2014-04-24: 2 g via INTRAVENOUS

## 2014-04-24 MED ORDER — EPINEPHRINE HCL 1 MG/ML IJ SOLN
INTRAMUSCULAR | Status: AC
  Filled 2014-04-24: qty 1

## 2014-04-24 MED ORDER — CHLORHEXIDINE GLUCONATE 4 % EX LIQD: 1.0000 "application " | Freq: Once | CUTANEOUS | Status: DC

## 2014-04-24 MED ORDER — HYDROCODONE-ACETAMINOPHEN 5-325 MG PO TABS: 1.0000 | ORAL_TABLET | Freq: Four times a day (QID) | ORAL | Status: DC | PRN

## 2014-04-24 MED ORDER — EPHEDRINE SULFATE 50 MG/ML IJ SOLN
INTRAMUSCULAR | Status: DC | PRN
  Administered 2014-04-24 (×3): 10 mg via INTRAVENOUS
  Administered 2014-04-24: 5 mg via INTRAVENOUS

## 2014-04-24 MED ORDER — FENTANYL CITRATE 0.05 MG/ML IJ SOLN
INTRAMUSCULAR | Status: DC | PRN
  Administered 2014-04-24: 25 ug via INTRAVENOUS
  Administered 2014-04-24: 100 ug via INTRAVENOUS
  Administered 2014-04-24 (×2): 50 ug via INTRAVENOUS

## 2014-04-24 MED ORDER — LIDOCAINE HCL 1 % IJ SOLN
INTRAVENOUS | Status: DC | PRN
  Administered 2014-04-24: 300 mL

## 2014-04-24 MED ORDER — LIDOCAINE HCL (PF) 1 % IJ SOLN
INTRAMUSCULAR | Status: AC
  Filled 2014-04-24: qty 60

## 2014-04-24 MED ORDER — SUCCINYLCHOLINE CHLORIDE 20 MG/ML IJ SOLN
INTRAMUSCULAR | Status: AC
  Filled 2014-04-24: qty 1

## 2014-04-24 MED ORDER — BUPIVACAINE-EPINEPHRINE (PF) 0.25% -1:200000 IJ SOLN
INTRAMUSCULAR | Status: AC
  Filled 2014-04-24: qty 30

## 2014-04-24 MED ORDER — MIDAZOLAM HCL 5 MG/5ML IJ SOLN
INTRAMUSCULAR | Status: DC | PRN
  Administered 2014-04-24 (×2): 1 mg via INTRAVENOUS

## 2014-04-24 MED ORDER — CEFAZOLIN SODIUM-DEXTROSE 2-3 GM-% IV SOLR
INTRAVENOUS | Status: AC
  Filled 2014-04-24: qty 50

## 2014-04-24 MED ORDER — LACTATED RINGERS IV SOLN
INTRAVENOUS | Status: DC
  Administered 2014-04-24 (×2): via INTRAVENOUS

## 2014-04-24 MED ORDER — MIDAZOLAM HCL 2 MG/2ML IJ SOLN
INTRAMUSCULAR | Status: AC
  Filled 2014-04-24: qty 2

## 2014-04-24 MED ORDER — PROPOFOL 10 MG/ML IV BOLUS
INTRAVENOUS | Status: DC | PRN
  Administered 2014-04-24: 150 mg via INTRAVENOUS

## 2014-04-24 MED ORDER — FENTANYL CITRATE 0.05 MG/ML IJ SOLN: 50.0000 ug | INTRAMUSCULAR | Status: DC | PRN

## 2014-04-24 MED ORDER — MIDAZOLAM HCL 2 MG/2ML IJ SOLN: 1.0000 mg | INTRAMUSCULAR | Status: DC | PRN

## 2014-04-24 MED ORDER — ONDANSETRON HCL 4 MG/2ML IJ SOLN
INTRAMUSCULAR | Status: DC | PRN
  Administered 2014-04-24: 4 mg via INTRAVENOUS

## 2014-04-24 MED ORDER — LIDOCAINE-EPINEPHRINE 1 %-1:100000 IJ SOLN
INTRAMUSCULAR | Status: AC
  Filled 2014-04-24: qty 1

## 2014-04-24 MED ORDER — HYDROMORPHONE HCL 1 MG/ML IJ SOLN: 0.2500 mg | INTRAMUSCULAR | Status: DC | PRN

## 2014-04-24 MED ORDER — SODIUM BICARBONATE 4 % IV SOLN
INTRAVENOUS | Status: AC
  Filled 2014-04-24: qty 10

## 2014-04-24 MED ORDER — PROPOFOL 10 MG/ML IV EMUL
INTRAVENOUS | Status: AC
  Filled 2014-04-24: qty 50

## 2014-04-24 MED ORDER — SODIUM BICARBONATE 4 % IV SOLN
INTRAVENOUS | Status: DC | PRN
  Administered 2014-04-24: 10 mL via INTRAVENOUS

## 2014-04-24 MED ORDER — SUCCINYLCHOLINE CHLORIDE 20 MG/ML IJ SOLN
INTRAMUSCULAR | Status: DC | PRN
  Administered 2014-04-24: 100 mg via INTRAVENOUS

## 2014-04-24 SURGICAL SUPPLY — 85 items
BENZOIN TINCTURE PRP APPL 2/3 (GAUZE/BANDAGES/DRESSINGS) IMPLANT
BINDER ABDOMINAL  9 SM 30-45 (SOFTGOODS) ×1
BINDER ABDOMINAL 10 UNV 27-48 (MISCELLANEOUS) IMPLANT
BINDER ABDOMINAL 12 SM 30-45 (SOFTGOODS) IMPLANT
BINDER ABDOMINAL 9 SM 30-45 (SOFTGOODS) ×1 IMPLANT
BINDER BREAST LRG (GAUZE/BANDAGES/DRESSINGS) IMPLANT
BINDER BREAST MEDIUM (GAUZE/BANDAGES/DRESSINGS) IMPLANT
BINDER BREAST XLRG (GAUZE/BANDAGES/DRESSINGS) IMPLANT
BINDER BREAST XXLRG (GAUZE/BANDAGES/DRESSINGS) IMPLANT
BLADE CLIPPER SURG (BLADE) IMPLANT
BLADE SURG 11 STRL SS (BLADE) ×2 IMPLANT
BLADE SURG 15 STRL LF DISP TIS (BLADE) ×1 IMPLANT
BLADE SURG 15 STRL SS (BLADE) ×1
BNDG GAUZE ELAST 4 BULKY (GAUZE/BANDAGES/DRESSINGS) ×4 IMPLANT
BRUSH SCRUB EZ PLAIN DRY (MISCELLANEOUS) ×2 IMPLANT
CANISTER LIPO FAT HARVEST (MISCELLANEOUS) ×2 IMPLANT
CANISTER SUCT 1200ML W/VALVE (MISCELLANEOUS) ×2 IMPLANT
CHLORAPREP W/TINT 26ML (MISCELLANEOUS) ×2 IMPLANT
CLEANER CAUTERY TIP 5X5 PAD (MISCELLANEOUS) IMPLANT
COVER BACK TABLE 60X90IN (DRAPES) ×2 IMPLANT
COVER MAYO STAND STRL (DRAPES) ×2 IMPLANT
COVER SURGICAL LIGHT HANDLE (MISCELLANEOUS) ×2 IMPLANT
DECANTER SPIKE VIAL GLASS SM (MISCELLANEOUS) IMPLANT
DRAIN CHANNEL 15F RND FF W/TCR (WOUND CARE) IMPLANT
DRAPE U-SHAPE 76X120 STRL (DRAPES) ×2 IMPLANT
DRSG EMULSION OIL 3X3 NADH (GAUZE/BANDAGES/DRESSINGS) ×2 IMPLANT
DRSG PAD ABDOMINAL 8X10 ST (GAUZE/BANDAGES/DRESSINGS) ×4 IMPLANT
ELECT COATED BLADE 2.86 ST (ELECTRODE) IMPLANT
ELECT NEEDLE BLADE 2-5/6 (NEEDLE) ×2 IMPLANT
ELECT REM PT RETURN 9FT ADLT (ELECTROSURGICAL) ×2
ELECTRODE REM PT RTRN 9FT ADLT (ELECTROSURGICAL) ×1 IMPLANT
EVACUATOR SILICONE 100CC (DRAIN) IMPLANT
FILTER LIPOSUCTION (MISCELLANEOUS) ×2 IMPLANT
GAUZE XEROFORM 1X8 LF (GAUZE/BANDAGES/DRESSINGS) IMPLANT
GLOVE BIO SURGEON STRL SZ 6 (GLOVE) ×4 IMPLANT
GLOVE BIO SURGEON STRL SZ 6.5 (GLOVE) IMPLANT
GLOVE EXAM NITRILE PF LG BLUE (GLOVE) IMPLANT
GOWN STRL REUS W/ TWL LRG LVL3 (GOWN DISPOSABLE) ×2 IMPLANT
GOWN STRL REUS W/TWL LRG LVL3 (GOWN DISPOSABLE) ×2
KIT FILL SYSTEM UNIVERSAL (SET/KITS/TRAYS/PACK) IMPLANT
LINER CANISTER 1000CC FLEX (MISCELLANEOUS) ×2 IMPLANT
LIQUID BAND (GAUZE/BANDAGES/DRESSINGS) ×2 IMPLANT
NDL SAFETY ECLIPSE 18X1.5 (NEEDLE) ×1 IMPLANT
NEEDLE HYPO 18GX1.5 SHARP (NEEDLE) ×1
NEEDLE HYPO 30GX1 BEV (NEEDLE) IMPLANT
NEEDLE PRECISIONGLIDE 27X1.5 (NEEDLE) IMPLANT
NS IRRIG 1000ML POUR BTL (IV SOLUTION) ×2 IMPLANT
PACK BASIN DAY SURGERY FS (CUSTOM PROCEDURE TRAY) ×2 IMPLANT
PACK UNIVERSAL I (CUSTOM PROCEDURE TRAY) ×2 IMPLANT
PAD ALCOHOL SWAB (MISCELLANEOUS) ×2 IMPLANT
PAD CLEANER CAUTERY TIP 5X5 (MISCELLANEOUS)
PENCIL BUTTON HOLSTER BLD 10FT (ELECTRODE) ×2 IMPLANT
SHEET MEDIUM DRAPE 40X70 STRL (DRAPES) IMPLANT
SLEEVE SCD COMPRESS KNEE MED (MISCELLANEOUS) ×2 IMPLANT
SPONGE GAUZE 2X2 8PLY STRL LF (GAUZE/BANDAGES/DRESSINGS) IMPLANT
SPONGE GAUZE 4X4 12PLY STER LF (GAUZE/BANDAGES/DRESSINGS) IMPLANT
SPONGE LAP 18X18 X RAY DECT (DISPOSABLE) ×6 IMPLANT
STAPLER VISISTAT 35W (STAPLE) ×2 IMPLANT
STRIP CLOSURE SKIN 1/2X4 (GAUZE/BANDAGES/DRESSINGS) IMPLANT
SUT CHROMIC 4 0 P 3 18 (SUTURE) IMPLANT
SUT CHROMIC 5 0 P 3 (SUTURE) ×6 IMPLANT
SUT ETHILON 2 0 FS 18 (SUTURE) IMPLANT
SUT MNCRL AB 4-0 PS2 18 (SUTURE) ×6 IMPLANT
SUT MON AB 5-0 P3 18 (SUTURE) IMPLANT
SUT PDS AB 2-0 CT2 27 (SUTURE) ×4 IMPLANT
SUT SILK 4 0 PS 2 (SUTURE) ×4 IMPLANT
SUT VIC AB 3-0 PS1 18 (SUTURE) ×1
SUT VIC AB 3-0 PS1 18XBRD (SUTURE) ×1 IMPLANT
SUT VIC AB 3-0 SH 27 (SUTURE)
SUT VIC AB 3-0 SH 27X BRD (SUTURE) IMPLANT
SUT VICRYL 4-0 PS2 18IN ABS (SUTURE) ×8 IMPLANT
SYR 20CC LL (SYRINGE) IMPLANT
SYR 50ML LL SCALE MARK (SYRINGE) ×8 IMPLANT
SYR BULB 3OZ (MISCELLANEOUS) IMPLANT
SYR BULB IRRIGATION 50ML (SYRINGE) ×2 IMPLANT
SYR CONTROL 10ML LL (SYRINGE) IMPLANT
SYR TB 1ML LL NO SAFETY (SYRINGE) IMPLANT
SYRINGE 10CC LL (SYRINGE) ×6 IMPLANT
TOWEL OR 17X24 6PK STRL BLUE (TOWEL DISPOSABLE) ×4 IMPLANT
TRAY DSU PREP LF (CUSTOM PROCEDURE TRAY) ×2 IMPLANT
TUBE CONNECTING 20X1/4 (TUBING) ×2 IMPLANT
TUBING INFILTRATION IT-10001 (TUBING) ×2 IMPLANT
TUBING SET GRADUATE ASPIR 12FT (MISCELLANEOUS) ×2 IMPLANT
UNDERPAD 30X30 INCONTINENT (UNDERPADS AND DIAPERS) ×4 IMPLANT
YANKAUER SUCT BULB TIP NO VENT (SUCTIONS) ×2 IMPLANT

## 2014-04-24 NOTE — Op Note (Signed)
Operative Note   DATE OF OPERATION: 3.4.2016  LOCATION: [facility]-outpatient  SURGICAL DIVISION: Plastic Surgery  PREOPERATIVE DIAGNOSES:  1. History right breast cancer 2. Acquired absence right breast  POSTOPERATIVE DIAGNOSES:  same  PROCEDURE:  1. Right nipple areolar reconstruction with local flap and full thickness skin graft from right groin 2. Lipofilling to right chest 3. Revision right TRAM flap reconstruction 3. Correction dog ear left lateral abdomen (complex repair 3 cm)  SURGEON: Irene Limbo MD MBA  ASSISTANT: none  ANESTHESIA:  General.   EBL: 50 ml  COMPLICATIONS: None immediate.   INDICATIONS FOR PROCEDURE:  The patient, Penny Howard, is a 69 y.o. female born on [birth year], is here for nipple areolar reconstruction following delayed TRAM flap reconstruction. Plan for lipofilling to improve contour and revision of flap   FINDINGS: 120 ml fat grafted to right chest and TRAM flap  DESCRIPTION OF PROCEDURE:  The patient was marked with the patient in the preoperative area in standing position to mark areas over bilateral flanks for fat harvest, chest midline, sternal notch, and breast meridians. The location for new nipple areolar complex on right marked symmetric with left breast. The patient was taken to the operating room. SCDs were placed and IV antibiotics were given. The patient's operative site was prepped and draped in a sterile fashion. A time out was performed and all information was confirmed to be correct. Tumescent solution infiltrated over bilateral flanks through stab incisions within previous low transverse abdomen scar. Total of 250 ml fluid infiltrated. Power assisted liposuction completed over bilateral flanks to endpoint of symmetric contour and soft tissue thickness. Fat prepared for infiltration with washing and gravity. Stab incision on right approximated with interrupted 4-0 monocryl. Over left side, patient had raised contour (dog ear)  from prior TRAM reconstruction. Elliptical excision of skin and subcutaneous fat completed and layered closure completed with 4-0 vicryl in dermis and 4-0 monocryl subcuticular. Length 3cm.   Patient presented with fold soft tissue medial breast and lack of definition if inframammary fold. This was addressed by incision at lower TRAM flap skin paddle inset. The abdominal tissue elevated. The superficial fascia and dermis was then inset to chest wall with 2-0 PDS interrrupted sutures. The medial redundant skin fold was excised. The wound was irrigated and using this incision, prepared fat placed subcutaneously and within flap itself over entire reconstructed breast. Closure completed with 3-0 and 4-0 vicryl in dermis and 4-0 monocryl subcuticular for skin closure. Tissue adhesive applied.  A modified star flap, based inferiorly, was designed over right TRAM flap skin paddle. 42 mm cookie cutter used to mark area for areola. The flaps were sharply incised and elevated in subcutaneous place. The lateral flaps were approximated in midline with 4-0 monocryl in dermis. The superior flap also inset  and skin brought together with 5-0 chromic. The remainder of marked area for areola was deepithelialized. Full thickness skin graft harvested from right groin and donor site closed with 4-0 vicryl in dermis and 4-0 monocryl subcuticular skin closure. Tissue adhesive applied. The skin was prepared to remove all subcutaneous fat and hair follicles. The graft was inset over right chest with 5-0 chromic. Bolster dressing of adaptic and sterile sponge secured with 4-0 silk.   Dry dressing and abdominal binder applied. The patient was allowed to wake from anesthesia, extubated and taken to the recovery room in satisfactory condition.   SPECIMENS: none  DRAINS: none   Irene Limbo, MD [facility] Plastic & Reconstructive Surgery [phone]

## 2014-04-24 NOTE — Transfer of Care (Signed)
Immediate Anesthesia Transfer of Care Note  Patient: Penny Howard  Procedure(s) Performed: Procedure(s): LIPOFILLING TO RIGHT CHEST/RIGHT NIPPLE AREOLA COMPLEX CREATION WITH FULL THICKNESS SKIN GRAFT/REVISION OF RIGHT TRANSFLAP (Right) SKIN GRAFT FULL THICKNESS (Right) SCAR REVISION (Right)  Patient Location: PACU  Anesthesia Type:General  Level of Consciousness: awake, alert , oriented and patient cooperative  Airway & Oxygen Therapy: Patient Spontanous Breathing and Patient connected to face mask oxygen  Post-op Assessment: Report given to RN and Post -op Vital signs reviewed and stable  Post vital signs: Reviewed and stable  Last Vitals:  Filed Vitals:   04/24/14 1036  BP:   Pulse: 123  Temp:   Resp: 20    Complications: No apparent anesthesia complications

## 2014-04-24 NOTE — Anesthesia Postprocedure Evaluation (Signed)
  Anesthesia Post-op Note  Patient: Penny Howard  Procedure(s) Performed: Procedure(s): LIPOFILLING TO RIGHT CHEST/RIGHT NIPPLE AREOLA COMPLEX CREATION WITH FULL THICKNESS SKIN GRAFT/REVISION OF RIGHT TRANSFLAP (Right) SKIN GRAFT FULL THICKNESS (Right) SCAR REVISION (Right)  Patient Location: PACU  Anesthesia Type:General  Level of Consciousness: awake and alert   Airway and Oxygen Therapy: Patient Spontanous Breathing  Post-op Pain: none  Post-op Assessment: Post-op Vital signs reviewed, Patient's Cardiovascular Status Stable and Respiratory Function Stable  Post-op Vital Signs: Reviewed  Filed Vitals:   04/24/14 1100  BP: 124/66  Pulse: 114  Temp:   Resp: 19    Complications: No apparent anesthesia complications

## 2014-04-24 NOTE — Anesthesia Procedure Notes (Signed)
Procedure Name: Intubation Date/Time: 04/24/2014 7:42 AM Performed by: Shatori Bertucci Pre-anesthesia Checklist: Patient identified, Emergency Drugs available, Suction available and Patient being monitored Patient Re-evaluated:Patient Re-evaluated prior to inductionOxygen Delivery Method: Circle System Utilized Preoxygenation: Pre-oxygenation with 100% oxygen Intubation Type: IV induction Ventilation: Mask ventilation without difficulty Laryngoscope Size: Mac and 3 Grade View: Grade I Tube type: Oral Tube size: 7.0 mm Number of attempts: 1 Airway Equipment and Method: Stylet and Oral airway Placement Confirmation: ETT inserted through vocal cords under direct vision,  positive ETCO2 and breath sounds checked- equal and bilateral Secured at: 21 cm Tube secured with: Tape Dental Injury: Teeth and Oropharynx as per pre-operative assessment

## 2014-04-24 NOTE — Discharge Instructions (Signed)

## 2014-04-24 NOTE — Anesthesia Preprocedure Evaluation (Addendum)
Anesthesia Evaluation  Patient identified by MRN, date of birth, ID band Patient awake    Reviewed: Allergy & Precautions, H&P , NPO status , Patient's Chart, lab work & pertinent test results  Airway Mallampati: II  TM Distance: >3 FB Neck ROM: Full    Dental no notable dental hx. (+) Teeth Intact, Dental Advisory Given   Pulmonary neg pulmonary ROS,  breath sounds clear to auscultation  Pulmonary exam normal       Cardiovascular hypertension, Pt. on medications Rhythm:Regular Rate:Normal     Neuro/Psych Anxiety negative neurological ROS  negative psych ROS   GI/Hepatic Neg liver ROS, GERD-  Medicated and Controlled,  Endo/Other  negative endocrine ROS  Renal/GU negative Renal ROS  negative genitourinary   Musculoskeletal   Abdominal   Peds  Hematology negative hematology ROS (+)   Anesthesia Other Findings   Reproductive/Obstetrics negative OB ROS                            Anesthesia Physical Anesthesia Plan  ASA: II  Anesthesia Plan: General   Post-op Pain Management:    Induction: Intravenous  Airway Management Planned: Oral ETT  Additional Equipment:   Intra-op Plan:   Post-operative Plan: Extubation in OR  Informed Consent: I have reviewed the patients History and Physical, chart, labs and discussed the procedure including the risks, benefits and alternatives for the proposed anesthesia with the patient or authorized representative who has indicated his/her understanding and acceptance.   Dental advisory given  Plan Discussed with: CRNA  Anesthesia Plan Comments:         Anesthesia Quick Evaluation

## 2014-04-24 NOTE — Interval H&P Note (Signed)
History and Physical Interval Note:  04/24/2014 7:04 AM  [name]  has presented today for surgery, with the diagnosis of history of breast cancer, acquired absent breast right,assymetry native and reconstructive breast  The various methods of treatment have been discussed with the patient and family. After consideration of risks, benefits and other options for treatment, the patient has consented to  [place] TRAM FLAP as a surgical intervention .  The patient's history has been reviewed, patient examined, no change in status, stable for surgery.  I have reviewed the patient's chart and labs.  Questions were answered to the patient's satisfaction.     Cia Garretson

## 2014-04-27 ENCOUNTER — Encounter (HOSPITAL_BASED_OUTPATIENT_CLINIC_OR_DEPARTMENT_OTHER): Payer: Self-pay | Admitting: Plastic Surgery

## 2014-05-22 ENCOUNTER — Other Ambulatory Visit: Payer: Self-pay

## 2014-05-22 DIAGNOSIS — Z1231 Encounter for screening mammogram for malignant neoplasm of breast: Secondary | ICD-10-CM

## 2014-06-12 ENCOUNTER — Ambulatory Visit
Admission: RE | Admit: 2014-06-12 | Discharge: 2014-06-12 | Disposition: A | Discharge status: Home / Self Care | Payer: Medicare Other | Source: Ambulatory Visit

## 2014-06-12 ENCOUNTER — Other Ambulatory Visit: Payer: Self-pay

## 2014-06-12 DIAGNOSIS — Z1231 Encounter for screening mammogram for malignant neoplasm of breast: Secondary | ICD-10-CM | POA: Diagnosis not present

## 2014-06-30 DIAGNOSIS — C50911 Malignant neoplasm of unspecified site of right female breast: Secondary | ICD-10-CM | POA: Diagnosis not present

## 2014-07-14 ENCOUNTER — Telehealth: Payer: Self-pay | Admitting: Oncology

## 2014-07-14 ENCOUNTER — Other Ambulatory Visit (HOSPITAL_BASED_OUTPATIENT_CLINIC_OR_DEPARTMENT_OTHER): Payer: Medicare Other

## 2014-07-14 ENCOUNTER — Ambulatory Visit (HOSPITAL_BASED_OUTPATIENT_CLINIC_OR_DEPARTMENT_OTHER): Payer: Medicare Other | Admitting: Oncology

## 2014-07-14 VITALS — BP 169/81 | HR 73 | Temp 97.6°F | Resp 18 | Ht 59.0 in | Wt 128.1 lb

## 2014-07-14 DIAGNOSIS — C50411 Malignant neoplasm of upper-outer quadrant of right female breast: Secondary | ICD-10-CM

## 2014-07-14 DIAGNOSIS — Z9011 Acquired absence of right breast and nipple: Secondary | ICD-10-CM

## 2014-07-14 DIAGNOSIS — Z853 Personal history of malignant neoplasm of breast: Secondary | ICD-10-CM

## 2014-07-14 DIAGNOSIS — Z79810 Long term (current) use of selective estrogen receptor modulators (SERMs): Secondary | ICD-10-CM | POA: Diagnosis not present

## 2014-07-14 LAB — COMPREHENSIVE METABOLIC PANEL (CC13)
ALT: 49 U/L (ref 0–55)
AST: 45 U/L — AB (ref 5–34)
Albumin: 4.2 g/dL (ref 3.5–5.0)
Alkaline Phosphatase: 57 U/L (ref 40–150)
Anion Gap: 12 mEq/L — ABNORMAL HIGH (ref 3–11)
BILIRUBIN TOTAL: 0.41 mg/dL (ref 0.20–1.20)
BUN: 11.3 mg/dL (ref 7.0–26.0)
CALCIUM: 9.5 mg/dL (ref 8.4–10.4)
CO2: 23 mEq/L (ref 22–29)
CREATININE: 0.8 mg/dL (ref 0.6–1.1)
Chloride: 105 mEq/L (ref 98–109)
EGFR: 83 mL/min/{1.73_m2} — ABNORMAL LOW (ref >90)
Glucose: 107 mg/dl (ref 70–140)
Potassium: 5.5 mEq/L — ABNORMAL HIGH (ref 3.5–5.1)
Sodium: 140 mEq/L (ref 136–145)
TOTAL PROTEIN: 7.3 g/dL (ref 6.4–8.3)

## 2014-07-14 LAB — CBC WITH DIFFERENTIAL/PLATELET
BASO%: 1.1 % (ref 0.0–2.0)
BASOS ABS: 0.1 10*3/uL (ref 0.0–0.1)
EOS%: 1.6 % (ref 0.0–7.0)
Eosinophils Absolute: 0.1 10*3/uL (ref 0.0–0.5)
HCT: 40.4 % (ref 34.8–46.6)
HEMOGLOBIN: 13.2 g/dL (ref 11.6–15.9)
LYMPH%: 38 % (ref 14.0–49.7)
MCH: 32 pg (ref 25.1–34.0)
MCHC: 32.7 g/dL (ref 31.5–36.0)
MCV: 98.1 fL (ref 79.5–101.0)
MONO#: 0.5 10*3/uL (ref 0.1–0.9)
MONO%: 8.9 % (ref 0.0–14.0)
NEUT%: 50.4 % (ref 38.4–76.8)
NEUTROS ABS: 2.8 10*3/uL (ref 1.5–6.5)
Platelets: 281 10*3/uL (ref 145–400)
RBC: 4.12 10*6/uL (ref 3.70–5.45)
RDW: 12.7 % (ref 11.2–14.5)
WBC: 5.6 10*3/uL (ref 3.9–10.3)
lymph#: 2.1 10*3/uL (ref 0.9–3.3)

## 2014-07-14 NOTE — Progress Notes (Signed)
[facility]  Telephone:([phone] Fax:([phone]) [phone]     ID: Penny Howard OB: 05-24-1945  MR#: [id number]  QVZ#:[id number]  PCP: Penny Ou, MD GYN:   SU: Excell Seltzer OTHER MD: Kyung Rudd, Arnoldo Hooker Thimmappa  CHIEF COMPLAINT: Estrogen receptor positive breast cancer  CURRENT TREATMENT: Tamoxifen   BREAST CANCER HISTORY: From the original intake note:  Penny Howard was bending over to get into her hot tub when she noted a mass in her right breast. She intended to keep it to herself, but her husband of 9 years, Penny Howard, noted her changed expression, asked her what it was about, and when she told him they got out of the top and called on her primary care physician. On 06/02/2013 the patient underwent right breast mammography and ultrasonography,  which showed an area of increased density in the superior aspect of the right breast, which was palpable and firm. By palpation it measured approximately 5 cm. Ultrasound located an irregular mass in the right breast at the 12:00 position measuring 4 cm maximally. Ultrasound of the right axilla showed normal axillary contents.  Biopsy of the right breast mass in question 06/02/2013 ([id number]) confirmed an invasive lobular tumor, grade 2, estrogen and progesterone receptors both 100% positive in both with strong staining intensity, with an MIB-1 of 80%. HER-2 is pending.  On 06/03/2013 the patient underwent left breast mammography, which was negative. On 06/09/2013 the patient underwent bilateral breast MRI. This showed, in the left breast, multiple enhancing nodules in the central portion measuring up to 4.6 cm in aggregate. There was evidence of nipple retraction. The largest single  discrete mass measured 2.7 cm. There was no involvement of the pectoralis and no abnormal appearing lymph nodes. The right breast was unremarkable.  The patient's subsequent history is as detailed below  INTERVAL HISTORY: [name] returns today for  followup of her breast cancer. She continues on tamoxifen, with excellent tolerance. She has hot flashes occasionally, not every day. She has no problems with vaginal wetness. She obtains a drug at $10 for 3 months' supply.Marland Kitchen  REVIEW OF SYSTEMS: They just returned from trips to [place] and [facility]. She is pleased with her reconstruction and has no discomfort there. She is planning to get back into golf soon. There've been no unusual headaches, visual changes, nausea, vomiting, dizziness, gait imbalance, difficulty swallowing, pain on swallowing, gastritis, cough, phlegm production, pleurisy, shortness of breath, or change in bowel or bladder habits. A detailed review of systems today was entirely benign.  PAST MEDICAL HISTORY: Past Medical History  Diagnosis Date  . Hypertension   . GERD (gastroesophageal reflux disease)   . High cholesterol   . Anxiety   . Cancer     breast rt  . Stress incontinence   . Allergy   . Osteoporosis   . Wears contact lenses     PAST SURGICAL HISTORY: Past Surgical History  Procedure Laterality Date  . Bladder surgery  10    mesh  . Mastectomy w/ sentinel node biopsy  07/04/2013    RT TOTAL            DR HOXWORTH  . Simple mastectomy with axillary sentinel node biopsy Right 07/04/2013    Procedure: RIGHT TOTAL MASTECTOMY WITH RIGHT  AXILLARY SENTINEL LYMPH NODE BIOPSY;  Surgeon: Edward Jolly, MD;  Location: [facility];  Service: General;  Laterality: Right;  . Abdominal hysterectomy  1990    ovaries intact b/l  . Tram Right 12/30/2013  Procedure: TRANSVERSE RECTUS ABDOMINIS MYOCUTANEOUS (TRAM) FLAP FOR RIGHT BREAST RECONSTRUCTION;  Surgeon: Irene Limbo, MD;  Location: [facility];  Service: Plastics;  Laterality: Right;  . Colonoscopy    . Liposuction with lipofilling Right 04/24/2014    Procedure: LIPOFILLING TO RIGHT CHEST/RIGHT NIPPLE AREOLA COMPLEX CREATION WITH FULL THICKNESS SKIN GRAFT/REVISION OF RIGHT TRANSFLAP;  Surgeon: Irene Limbo, MD;  Location: [facility];  Service: Plastics;  Laterality: Right;  . Skin full thickness graft Right 04/24/2014    Procedure: SKIN GRAFT FULL THICKNESS;  Surgeon: Irene Limbo, MD;  Location: [facility] [place];  Service: Plastics;  Laterality: Right;  . Scar revision Right 04/24/2014    Procedure: SCAR REVISION;  Surgeon: Irene Limbo, MD;  Location: [facility];  Service: Plastics;  Laterality: Right;    FAMILY HISTORY Family History  Problem Relation Age of Onset  . Breast cancer Mother   . Breast cancer Paternal Aunt   . Lymphoma Cousin     non- hodgkins lymphoma   the patient's father died with congestive heart failure at the age of 42. The patient's mother died from "multiple causes" at the age of 79. Penny Howard had one brother, no sisters. The patient's mother was diagnosed with breast cancer the age of 20 there is also a paternal aunt diagnosed with breast cancer at the age of 84.  GYNECOLOGIC HISTORY:  Menarche age 21, first live birth age 68, the patient is Penny Howard. She stopped having periods approximately 1990. She did not take hormone replacement. 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 Her husband Penny Howard (goes by "choked") used to work Sales executive stations. Their name is pronounced "Nam-zura"--it is of [place] origin. Daughter Penny Howard lives in [place] for she is a Nurse, children's and daughter Penny Howard lives in [place] where she is a housewife. The patient has 1 grandchild. She attends a [employer].    ADVANCED DIRECTIVES: Not in place   HEALTH MAINTENANCE: History  Substance Use Topics  . Smoking status: Never Smoker   . Smokeless tobacco: Never Used  . Alcohol Use: 3.0 oz/week    5 Glasses of wine per week     Comment: occaionally     Colonoscopy: 2001?/Dr. Collene Mares  PAP:  Bone density:  2014/osteopenia  Lipid panel:  Allergies  Allergen Reactions  . Codeine Nausea Only    Current Outpatient Prescriptions  Medication Sig Dispense Refill  . escitalopram (LEXAPRO) 10 MG tablet Take 10 mg by mouth daily.    Marland Kitchen esomeprazole (NEXIUM) 20 MG capsule Take 20 mg by mouth daily at 12 noon.    Marland Kitchen HYDROcodone-acetaminophen (NORCO) 5-325 MG per tablet Take 1-2 tablets by mouth every 6 (six) hours as needed for moderate pain. 20 tablet 0  . lisinopril (PRINIVIL,ZESTRIL) 10 MG tablet Take 10 mg by mouth daily.    . multivitamin-lutein (OCUVITE-LUTEIN) CAPS capsule Take 1 capsule by mouth daily. 250 mg capsule    . Red Yeast Rice 600 MG CAPS Take 600 mg by mouth daily.    . risedronate (ACTONEL) 150 MG tablet Take 150 mg by mouth every 30 (thirty) days. with water on empty stomach, nothing by mouth or lie down for next 30 minutes.    . tamoxifen (NOLVADEX) 20 MG tablet Take 1 tablet (20 mg total) by mouth daily. 90 tablet 4   No current facility-administered medications for this visit.  OBJECTIVE: Middle-aged white woman in no acute distress Filed Vitals:   07/14/14 1106  BP: 169/81  Pulse: 73  Temp: 97.6 F (36.4 C)  Resp: 18     Body mass index is 25.86 kg/(m^2).    ECOG FS:0 - Asymptomatic  Sclerae unicteric, EOMs intact Oropharynx clear, dentition in good repair No cervical or supraclavicular adenopathy Lungs no rales or rhonchi Heart regular rate and rhythm Abd soft, nontender, positive bowel sounds MSK no focal spinal tenderness, no upper extremity lymphedema Neuro: nonfocal, well oriented, appropriate affect Breasts: The right breast is status post mastectomy and TRAM reconstruction. There is slight irregularity superiorly, but it otherwise "hangs" well and the overall appearance is mostly symmetrical. There is no evidence of local recurrence. The right axilla is benign per the left breast is unremarkable.  LAB RESULTS:  CMP     Component Value Date/Time   NA  136 04/16/2014 1040   NA 140 02/11/2014 1501   K 4.3 04/16/2014 1040   K 4.0 02/11/2014 1501   CL 108 04/16/2014 1040   CO2 23 04/16/2014 1040   CO2 23 02/11/2014 1501   GLUCOSE 101* 04/16/2014 1040   GLUCOSE 94 02/11/2014 1501   BUN 11 04/16/2014 1040   BUN 15.9 02/11/2014 1501   CREATININE 0.66 04/16/2014 1040   CREATININE 0.8 02/11/2014 1501   CALCIUM 9.0 04/16/2014 1040   CALCIUM 9.4 02/11/2014 1501   PROT 7.2 02/11/2014 1501   ALBUMIN 4.2 02/11/2014 1501   AST 29 02/11/2014 1501   ALT 30 02/11/2014 1501   ALKPHOS 52 02/11/2014 1501   BILITOT 0.28 02/11/2014 1501   GFRNONAA >90 04/16/2014 1040   GFRAA >90 04/16/2014 1040    I No results found for: SPEP  Lab Results  Component Value Date   WBC 5.6 07/14/2014   NEUTROABS 2.8 07/14/2014   HGB 13.2 07/14/2014   HCT 40.4 07/14/2014   MCV 98.1 07/14/2014   PLT 281 07/14/2014      Chemistry      Component Value Date/Time   NA 136 04/16/2014 1040   NA 140 02/11/2014 1501   K 4.3 04/16/2014 1040   K 4.0 02/11/2014 1501   CL 108 04/16/2014 1040   CO2 23 04/16/2014 1040   CO2 23 02/11/2014 1501   BUN 11 04/16/2014 1040   BUN 15.9 02/11/2014 1501   CREATININE 0.66 04/16/2014 1040   CREATININE 0.8 02/11/2014 1501      Component Value Date/Time   CALCIUM 9.0 04/16/2014 1040   CALCIUM 9.4 02/11/2014 1501   ALKPHOS 52 02/11/2014 1501   AST 29 02/11/2014 1501   ALT 30 02/11/2014 1501   BILITOT 0.28 02/11/2014 1501       No results found for: LABCA2  No components found for: LABCA125  No results for input(s): INR in the last 168 hours.  Urinalysis No results found for: COLORURINE  STUDIES:  Study Result     CLINICAL DATA: Screening.  EXAM: DIGITAL SCREENING UNILATERAL LEFT MAMMOGRAM WITH TOMO AND CAD  COMPARISON: Previous exam(s).  ACR Breast Density Category c: The breast tissue is heterogeneously dense, which may obscure small masses.  FINDINGS: There are no findings suspicious for  malignancy. Images were processed with CAD.  IMPRESSION: No mammographic evidence of malignancy. A result letter of this screening mammogram will be mailed directly to the patient.  RECOMMENDATION: Screening mammogram in one year. (Code:SM-B-01Y)  BI-RADS CATEGORY 1: Negative.   Electronically Signed  By: Margarette Canada M.D.  On: 06/12/2014  16:57      ASSESSMENT: 69 y.o. [place] woman status post right breast biopsy 06/02/2013 for a clinical T2 N0, stage IIA invasive lobular breast cancer, grade 2, estrogen receptor and progesterone receptor positive, with an MIB-1 of 80%, HER-2 obtained from definitive surgery  (1) status post right mastectomy and sentinel lymph node sampling 07/04/2013 for a pT3, pN0, stage IIB invasive lobular carcinoma, grade 2, HER-2 negative  (2) Oncotype score of 6 predicts a risk of outside the breast recurrence within 10 years and 5% of the patient's only systemic treatment is tamoxifen for 5 years. It also predicts no benefit from chemotherapy  (3) tamoxifen started 09/15/2013  (4) underwent TRAM flap reconstruction 12/30/2013  PLAN: Timira is doing terrific from a breast cancer point of view. There is no evidence of disease activity, she is tolerating the tamoxifen essentially with no side effects, and she is not burdened with the thought of the possibility of cancer recurrence.  She will see [place] again in 6 months for routine follow-up and then she will see me again next May after her April left mammography. The overall plan is to continue tamoxifen at least for 5 years and then consider whether to switch to an aromatase inhibitor or not.  She knows to call for any problems that may develop before the next visit here.   Chauncey Cruel, MD   07/14/2014 11:13 AM

## 2014-07-14 NOTE — Telephone Encounter (Signed)
Appointments made and avs printed for patient °

## 2014-08-19 ENCOUNTER — Other Ambulatory Visit: Payer: Self-pay | Admitting: Oncology

## 2014-08-19 NOTE — Telephone Encounter (Signed)
Last OV 07/14/14.  Next OV 12/22/14.  Chart reviewed

## 2014-10-29 DIAGNOSIS — M810 Age-related osteoporosis without current pathological fracture: Secondary | ICD-10-CM | POA: Diagnosis not present

## 2014-10-29 DIAGNOSIS — I10 Essential (primary) hypertension: Secondary | ICD-10-CM | POA: Diagnosis not present

## 2014-10-29 DIAGNOSIS — F419 Anxiety disorder, unspecified: Secondary | ICD-10-CM | POA: Diagnosis not present

## 2014-11-17 ENCOUNTER — Encounter: Payer: Self-pay | Admitting: Physician Assistant

## 2014-11-17 ENCOUNTER — Ambulatory Visit (INDEPENDENT_AMBULATORY_CARE_PROVIDER_SITE_OTHER): Payer: Medicare Other | Admitting: Physician Assistant

## 2014-11-17 VITALS — BP 179/90 | HR 77 | Temp 97.6°F | Ht 59.0 in | Wt 131.0 lb

## 2014-11-17 DIAGNOSIS — L570 Actinic keratosis: Secondary | ICD-10-CM

## 2014-11-17 NOTE — Progress Notes (Signed)
Patient ID: Penny Howard, female   DOB: 1945-07-01, 69 y.o.   MRN: [id number]  69 y/o female presents for skin check. No h/o skin CA.  PE reveals numerous areas of lentigo and actinic changes on face, chest, bilateral upper and lower extremities.   Areas were treated with cryosurgery x 14. Advised patient to follow up for reassessment and biopsy to r/o BCC/SCC if lesions recur. Also suggest fsc yearly.   Tiffany A. Benjamin Stain PA-C

## 2014-11-25 ENCOUNTER — Other Ambulatory Visit: Payer: Self-pay | Admitting: *Deleted

## 2014-11-25 ENCOUNTER — Telehealth: Payer: Self-pay | Admitting: Oncology

## 2014-11-25 NOTE — Telephone Encounter (Signed)
s.w. pt husband and advised on DEC appt....ok and aware

## 2014-12-22 ENCOUNTER — Other Ambulatory Visit: Payer: Medicare Other

## 2014-12-22 ENCOUNTER — Ambulatory Visit: Payer: Medicare Other | Admitting: Nurse Practitioner

## 2014-12-23 ENCOUNTER — Encounter: Payer: Self-pay | Admitting: Pediatrics

## 2015-01-04 ENCOUNTER — Encounter: Payer: Self-pay | Admitting: Pediatrics

## 2015-01-04 ENCOUNTER — Ambulatory Visit (INDEPENDENT_AMBULATORY_CARE_PROVIDER_SITE_OTHER): Payer: Medicare Other | Admitting: Pediatrics

## 2015-01-04 VITALS — BP 127/75 | HR 85 | Temp 97.2°F | Ht 59.0 in | Wt 134.4 lb

## 2015-01-04 DIAGNOSIS — M810 Age-related osteoporosis without current pathological fracture: Secondary | ICD-10-CM | POA: Diagnosis not present

## 2015-01-04 DIAGNOSIS — I10 Essential (primary) hypertension: Secondary | ICD-10-CM | POA: Diagnosis not present

## 2015-01-04 DIAGNOSIS — Z1159 Encounter for screening for other viral diseases: Secondary | ICD-10-CM | POA: Diagnosis not present

## 2015-01-04 DIAGNOSIS — E785 Hyperlipidemia, unspecified: Secondary | ICD-10-CM | POA: Diagnosis not present

## 2015-01-04 DIAGNOSIS — F411 Generalized anxiety disorder: Secondary | ICD-10-CM

## 2015-01-04 MED ORDER — RISEDRONATE SODIUM 150 MG PO TABS: 150.0000 mg | ORAL_TABLET | ORAL | Status: DC

## 2015-01-04 MED ORDER — LISINOPRIL 10 MG PO TABS: 10.0000 mg | ORAL_TABLET | Freq: Every day | ORAL | Status: DC

## 2015-01-04 MED ORDER — ESCITALOPRAM OXALATE 10 MG PO TABS: 10.0000 mg | ORAL_TABLET | Freq: Every day | ORAL | Status: DC

## 2015-01-04 NOTE — Progress Notes (Signed)
Subjective:    Patient ID: Penny Howard, female    DOB: Aug 23, 1945, 69 y.o.   MRN: [id number]  CC: multiple problem f/u  HPI: Penny Howard is a 69 y.o. female presenting on 01/04/2015 for multiple med problem follow up.  Osteoporosis: DEXA scan within last 2 years, started on risondronate. Tolerating well, no side effects  HTN: has been on lisinopril, well controlled at home, no HA, no vision changes  Breast cancer: had surgery, no chemo or radiation. Now on tamoxifen, follows up with her oncologist next month  Anxiety: well controlled on lexapro  HLD: intolerant of a statin in the past, doesn't remember what s/e she had. Taking red yeast rice  Wants to lose another 5 lbs before holidays. Used to do Zumba, also likes walking, trying to make healthy choices with eating, husband has diabetes  PGM died for CVA at 67yo   ROS: All systems negative other than what is in HPI  Past Medical History Patient Active Problem List   Diagnosis Date Noted  . Acquired absence of breast and absent nipple 12/30/2013  . Malignant neoplasm of breast ([facility]) 06/12/2013  . Breast cancer of upper-outer quadrant of right female breast ([facility]) 06/05/2013  . Combined fat and carbohydrate induced hyperlipemia 08/26/2012  . OP (osteoporosis) 08/10/2011  . Abnormal glucose tolerance test 05/02/2011  . Clinical depression 08/04/2010  . Malaise and fatigue 02/02/2010  . 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 Anxiety state 11/03/2004  . Benign essential HTN 11/03/2004  . Esophagitis, reflux 11/03/2004  . Absence of bladder continence 11/03/2004   Social History   Social History  . Marital Status: Married    Spouse Name: N/A  . Number of Children: N/A  . Years of Education: N/A   Occupational History  . Not on file.   Social History Main Topics  . Smoking status: Never Smoker   . Smokeless tobacco: Never Used  . Alcohol Use: 3.0 oz/week    5 Glasses of wine per week     Comment: occaionally    . Drug Use: No  . Sexual Activity: Yes   Other Topics Concern  . Not on file   Social History Narrative   Family History  Problem Relation Age of Onset  . Breast cancer Mother   . Breast cancer Paternal Aunt   . Lymphoma Cousin     non- hodgkins lymphoma      Current Outpatient Prescriptions  Medication Sig Dispense Refill  . escitalopram (LEXAPRO) 10 MG tablet Take 1 tablet (10 mg total) by mouth daily. 90 tablet 3  . esomeprazole (NEXIUM) 20 MG capsule Take 20 mg by mouth daily at 12 noon.    Marland Kitchen lisinopril (PRINIVIL,ZESTRIL) 10 MG tablet Take 1 tablet (10 mg total) by mouth daily. 30 tablet 6  . multivitamin-lutein (OCUVITE-LUTEIN) CAPS capsule Take 1 capsule by mouth daily. 250 mg capsule    . Red Yeast Rice 600 MG CAPS Take 600 mg by mouth daily.    . tamoxifen (NOLVADEX) 20 MG tablet TAKE 1 TABLET DAILY 90 tablet 3  . risedronate (ACTONEL) 150 MG tablet Take 1 tablet (150 mg total) by mouth every 30 (thirty) days. with water on empty stomach, nothing by mouth or lie down for next 30 minutes. 3 tablet 3   No current facility-administered medications for this visit.       Objective:    BP 127/75 mmHg  Pulse 85  Temp(Src) 97.2 F (36.2 C) (Oral)  Ht 4'  11" (1.499 m)  Wt 134 lb 6.4 oz (60.963 kg)  BMI 27.13 kg/m2  Wt Readings from Last 3 Encounters:  01/04/15 134 lb 6.4 oz (60.963 kg)  11/17/14 131 lb (59.421 kg)  07/14/14 128 lb 1.6 oz (58.106 kg)     Gen: NAD, alert, cooperative with exam, NCAT EYES: EOMI, no scleral injection or icterus ENT:  TMs pearly gray b/l, OP without erythema LYMPH: no cervical LAD CV: NRRR, normal S1/S2, no murmur, distal pulses 2+ b/l Resp: CTABL, no wheezes, normal WOB Abd: +BS, soft, NTND. no guarding or organomegaly Ext: No edema, warm Neuro: Alert and oriented, strength equal b/l UE and LE, coordination grossly normal MSK: normal muscle bulk     Assessment & Plan:   Ngozi was seen today for mulitple med problem f/u.  Overall doing very well.  Diagnoses and all orders for this visit:  Benign essential HTN Well controlled today, continue current meds, check labs as below -     lisinopril (PRINIVIL,ZESTRIL) 10 MG tablet; Take 1 tablet (10 mg total) by mouth daily. -     BMP8+EGFR  Anxiety state Well contorlle,d cont current med -     escitalopram (LEXAPRO) 10 MG tablet; Take 1 tablet (10 mg total) by mouth daily.  OP (osteoporosis) Continue risdronate, no side effects -     risedronate (ACTONEL) 150 MG tablet; Take 1 tablet (150 mg total) by mouth every 30 (thirty) days. with water on empty stomach, nothing by mouth or lie down for next 30 minutes.  Hyperlipidemia recheck lipids -     Lipid Panel  Need for hepatitis C screening test  -     Hepatitis C antibody    Follow up plan: Return in about 8 months (around 09/03/2015).  Assunta Found, MD [facility] 01/04/2015, 10:48 AM

## 2015-01-05 LAB — BMP8+EGFR
BUN/Creatinine Ratio: 17 (ref 11–26)
BUN: 12 mg/dL (ref 8–27)
CO2: 22 mmol/L (ref 18–29)
Calcium: 9.7 mg/dL (ref 8.7–10.3)
Chloride: 102 mmol/L (ref 97–106)
Creatinine, Ser: 0.71 mg/dL (ref 0.57–1.00)
GFR calc Af Amer: 102 mL/min/{1.73_m2} (ref >59)
GFR calc non Af Amer: 88 mL/min/{1.73_m2} (ref >59)
GLUCOSE: 107 mg/dL — AB (ref 65–99)
Potassium: 5.2 mmol/L (ref 3.5–5.2)
Sodium: 142 mmol/L (ref 136–144)

## 2015-01-05 LAB — HEPATITIS C ANTIBODY: Hep C Virus Ab: <0.1 s/co ratio (ref 0.0–0.9)

## 2015-01-05 LAB — LIPID PANEL
CHOLESTEROL TOTAL: 228 mg/dL — AB (ref 100–199)
Chol/HDL Ratio: 4 ratio units (ref 0.0–4.4)
HDL: 57 mg/dL (ref >39)
LDL CALC: 149 mg/dL — AB (ref 0–99)
TRIGLYCERIDES: 111 mg/dL (ref 0–149)
VLDL Cholesterol Cal: 22 mg/dL (ref 5–40)

## 2015-01-22 ENCOUNTER — Ambulatory Visit (HOSPITAL_BASED_OUTPATIENT_CLINIC_OR_DEPARTMENT_OTHER): Payer: Medicare Other | Admitting: Nurse Practitioner

## 2015-01-22 ENCOUNTER — Encounter: Payer: Self-pay | Admitting: Nurse Practitioner

## 2015-01-22 ENCOUNTER — Other Ambulatory Visit (HOSPITAL_BASED_OUTPATIENT_CLINIC_OR_DEPARTMENT_OTHER): Payer: Medicare Other

## 2015-01-22 ENCOUNTER — Telehealth: Payer: Self-pay | Admitting: Nurse Practitioner

## 2015-01-22 VITALS — BP 151/80 | HR 79 | Temp 97.8°F | Resp 18 | Ht 59.0 in | Wt 133.3 lb

## 2015-01-22 DIAGNOSIS — C50411 Malignant neoplasm of upper-outer quadrant of right female breast: Secondary | ICD-10-CM

## 2015-01-22 DIAGNOSIS — M8580 Other specified disorders of bone density and structure, unspecified site: Secondary | ICD-10-CM | POA: Diagnosis not present

## 2015-01-22 DIAGNOSIS — C50911 Malignant neoplasm of unspecified site of right female breast: Secondary | ICD-10-CM

## 2015-01-22 DIAGNOSIS — R5382 Chronic fatigue, unspecified: Secondary | ICD-10-CM

## 2015-01-22 DIAGNOSIS — Z170 Estrogen receptor positive status [ER+]: Secondary | ICD-10-CM

## 2015-01-22 LAB — COMPREHENSIVE METABOLIC PANEL
ALBUMIN: 4.2 g/dL (ref 3.5–5.0)
ALK PHOS: 49 U/L (ref 40–150)
ALT: 54 U/L (ref 0–55)
AST: 39 U/L — AB (ref 5–34)
Anion Gap: 10 mEq/L (ref 3–11)
BUN: 15.9 mg/dL (ref 7.0–26.0)
CHLORIDE: 108 meq/L (ref 98–109)
CO2: 21 mEq/L — ABNORMAL LOW (ref 22–29)
Calcium: 10 mg/dL (ref 8.4–10.4)
Creatinine: 0.8 mg/dL (ref 0.6–1.1)
EGFR: 76 mL/min/{1.73_m2} — AB (ref >90)
Glucose: 98 mg/dl (ref 70–140)
POTASSIUM: 5.4 meq/L — AB (ref 3.5–5.1)
Sodium: 139 mEq/L (ref 136–145)
Total Bilirubin: 0.37 mg/dL (ref 0.20–1.20)
Total Protein: 7.5 g/dL (ref 6.4–8.3)

## 2015-01-22 LAB — CBC WITH DIFFERENTIAL/PLATELET
BASO%: 1.3 % (ref 0.0–2.0)
Basophils Absolute: 0.1 10*3/uL (ref 0.0–0.1)
EOS%: 1.8 % (ref 0.0–7.0)
Eosinophils Absolute: 0.1 10*3/uL (ref 0.0–0.5)
HCT: 40.9 % (ref 34.8–46.6)
HEMOGLOBIN: 13.4 g/dL (ref 11.6–15.9)
LYMPH%: 33.6 % (ref 14.0–49.7)
MCH: 31.8 pg (ref 25.1–34.0)
MCHC: 32.8 g/dL (ref 31.5–36.0)
MCV: 96.9 fL (ref 79.5–101.0)
MONO#: 0.5 10*3/uL (ref 0.1–0.9)
MONO%: 9.6 % (ref 0.0–14.0)
NEUT#: 2.9 10*3/uL (ref 1.5–6.5)
NEUT%: 53.7 % (ref 38.4–76.8)
Platelets: 256 10*3/uL (ref 145–400)
RBC: 4.22 10*6/uL (ref 3.70–5.45)
RDW: 12.6 % (ref 11.2–14.5)
WBC: 5.4 10*3/uL (ref 3.9–10.3)
lymph#: 1.8 10*3/uL (ref 0.9–3.3)

## 2015-01-22 NOTE — Telephone Encounter (Signed)
Appointments made and avs printed for patient °

## 2015-01-22 NOTE — Progress Notes (Signed)
[facility]  Telephone:(336) [phone] Fax:(336) [phone]     ID: Penny Howard OB: [birth year]  MR#: [id number]  JKK#:[id number]  PCP: Eustaquio Maize, MD GYN:   SU: Excell Seltzer OTHER MD: Kyung Rudd, Arnoldo Hooker Thimmappa  CHIEF COMPLAINT: Estrogen receptor positive breast cancer  CURRENT TREATMENT: Tamoxifen  BREAST CANCER HISTORY: From the original intake note:  Penny Howard was bending over to get into her hot tub when she noted a mass in her right breast. She intended to keep it to herself, but her husband of 44 years, Penny Howard, noted her changed expression, asked her what it was about, and when she told him they got out of the top and called on her primary care physician. On 06/02/2013 the patient underwent right breast mammography and ultrasonography,  which showed an area of increased density in the superior aspect of the right breast, which was palpable and firm. By palpation it measured approximately 5 cm. Ultrasound located an irregular mass in the right breast at the 12:00 position measuring 4 cm maximally. Ultrasound of the right axilla showed normal axillary contents.  Biopsy of the right breast mass in question 06/02/2013 ([id number]) confirmed an invasive lobular tumor, grade 2, estrogen and progesterone receptors both 100% positive in both with strong staining intensity, with an MIB-1 of 80%. HER-2 is pending.  On 06/03/2013 the patient underwent left breast mammography, which was negative. On 06/09/2013 the patient underwent bilateral breast MRI. This showed, in the left breast, multiple enhancing nodules in the central portion measuring up to 4.6 cm in aggregate. There was evidence of nipple retraction. The largest single  discrete mass measured 2.7 cm. There was no involvement of the pectoralis and no abnormal appearing lymph nodes. The right breast was unremarkable.  The patient's subsequent history is as detailed below  INTERVAL HISTORY: Penny Howard returns today for  followup of her breast cancer. She has been on tamoxifen since July 2015 and tolerates it remarkably well. She has occasional mild hot flashes. She denies vaginal changes. The interval history is generally unremarkable. She is not regularly physically active and understands this contributes to her lack of endurance and generalized fatigue. She used to walk a few times a week for exercise and even has a treadmill for bad weather day.   REVIEW OF SYSTEMS: A detailed review of systems is otherwise stable, except where noted above.   PAST MEDICAL HISTORY: Past Medical History  Diagnosis Date  . Hypertension   . GERD (gastroesophageal reflux disease)   . High cholesterol   . Anxiety   . Cancer ([place])     breast rt  . Stress incontinence   . Allergy   . Osteoporosis   . Wears contact lenses     PAST SURGICAL HISTORY: Past Surgical History  Procedure Laterality Date  . Bladder surgery  10    mesh  . Mastectomy w/ sentinel node biopsy  07/04/2013    RT TOTAL            DR HOXWORTH  . Simple mastectomy with axillary sentinel node biopsy Right 07/04/2013    Procedure: RIGHT TOTAL MASTECTOMY WITH RIGHT  AXILLARY SENTINEL LYMPH NODE BIOPSY;  Surgeon: Edward Jolly, MD;  Location: [facility];  Service: General;  Laterality: Right;  . Abdominal hysterectomy  1990    ovaries intact b/l  . Tram Right 12/30/2013    Procedure: TRANSVERSE RECTUS ABDOMINIS MYOCUTANEOUS (TRAM) FLAP FOR RIGHT BREAST RECONSTRUCTION;  Surgeon: Irene Limbo, MD;  Location: [facility];  Service: Clinical cytogeneticist;  Laterality: Right;  . Colonoscopy    . Liposuction with lipofilling Right 04/24/2014    Procedure: LIPOFILLING TO RIGHT CHEST/RIGHT NIPPLE AREOLA COMPLEX CREATION WITH FULL THICKNESS SKIN GRAFT/REVISION OF RIGHT TRANSFLAP;  Surgeon: Irene Limbo, MD;  Location: [facility];  Service: Plastics;  Laterality: Right;  . Skin full thickness graft Right 04/24/2014    Procedure: SKIN GRAFT FULL THICKNESS;  Surgeon:  Irene Limbo, MD;  Location: [facility];  Service: Plastics;  Laterality: Right;  . Scar revision Right 04/24/2014    Procedure: SCAR REVISION;  Surgeon: Irene Limbo, MD;  Location: [facility];  Service: Plastics;  Laterality: Right;    FAMILY HISTORY Family History  Problem Relation Age of Onset  . Breast cancer Mother   . Breast cancer Paternal Aunt   . Lymphoma Cousin     non- hodgkins lymphoma   the patient's father died with congestive heart failure at the age of 37. The patient's mother died from "multiple causes" at the age of 67. Baker Janus had one brother, no sisters. The patient's mother was diagnosed with breast cancer the age of 67 there is also a paternal aunt diagnosed with breast cancer at the age of 67.  GYNECOLOGIC HISTORY:  Menarche age 69, first live birth age 69, the patient is [place] P2. She stopped having periods approximately 1990. She did not take hormone replacement. 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 Her husband Penny Howard (goes by "choked") used to work Sales executive stations. Their name is pronounced "Nam-zura"--it is of [place]. Daughter Penny Howard lives in [place] for she is a Nurse, children's and daughter Penny Howard lives in [place] where she is a housewife. The patient has 1 grandchild. She attends a [employer].    ADVANCED DIRECTIVES: Not in place   HEALTH MAINTENANCE: Social History  Substance Use Topics  . Smoking status: Never Smoker   . Smokeless tobacco: Never Used  . Alcohol Use: 3.0 oz/week    5 Glasses of wine per week     Comment: occaionally     Colonoscopy: 2001?/Dr. Collene Mares  PAP:  Bone density: 2014/osteopenia  Lipid panel:  Allergies  Allergen Reactions  . Codeine Nausea Only    Current Outpatient Prescriptions  Medication Sig Dispense Refill  . escitalopram  (LEXAPRO) 10 MG tablet Take 1 tablet (10 mg total) by mouth daily. 90 tablet 3  . esomeprazole (NEXIUM) 20 MG capsule Take 20 mg by mouth daily at 12 noon.    Marland Kitchen lisinopril (PRINIVIL,ZESTRIL) 10 MG tablet Take 1 tablet (10 mg total) by mouth daily. 30 tablet 6  . multivitamin-lutein (OCUVITE-LUTEIN) CAPS capsule Take 1 capsule by mouth daily. 250 mg capsule    . Red Yeast Rice 600 MG CAPS Take 600 mg by mouth daily.    . risedronate (ACTONEL) 150 MG tablet Take 1 tablet (150 mg total) by mouth every 30 (thirty) days. with water on empty stomach, nothing by mouth or lie down for next 30 minutes. 3 tablet 3  . tamoxifen (NOLVADEX) 20 MG tablet TAKE 1 TABLET DAILY 90 tablet 3   No current facility-administered medications for this visit.    OBJECTIVE: Middle-aged white woman in no acute distress Filed Vitals:   01/22/15 1058  BP: 151/80  Pulse: 79  Temp: 97.8 F (36.6 C)  Resp: 18     Body mass  index is 26.91 kg/(m^2).    ECOG FS:0 - Asymptomatic   Skin: warm, dry  HEENT: sclerae anicteric, conjunctivae pink, oropharynx clear. No thrush or mucositis.  Lymph Nodes: No cervical or supraclavicular lymphadenopathy  Lungs: clear to auscultation bilaterally, no rales, wheezes, or rhonci  Heart: regular rate and rhythm  Abdomen: round, soft, non tender, positive bowel sounds  Musculoskeletal: No focal spinal tenderness, no peripheral edema  Neuro: non focal, well oriented, positive affect  Breasts: right breast status post mastectomy and TRAM flap reconstruction. Palpable irregularities to the upper outer quadrant and along the 9 oclock region , but is otherwise stable. Right axilla benign. Left breast unremarkable.  LAB RESULTS:  CMP     Component Value Date/Time   NA 139 01/22/2015 1035   NA 142 01/04/2015 1125   NA 136 04/16/2014 1040   K 5.4* 01/22/2015 1035   K 5.2 01/04/2015 1125   CL 102 01/04/2015 1125   CO2 21* 01/22/2015 1035   CO2 22 01/04/2015 1125   GLUCOSE 98  01/22/2015 1035   GLUCOSE 107* 01/04/2015 1125   GLUCOSE 101* 04/16/2014 1040   BUN 15.9 01/22/2015 1035   BUN 12 01/04/2015 1125   BUN 11 04/16/2014 1040   CREATININE 0.8 01/22/2015 1035   CREATININE 0.71 01/04/2015 1125   CALCIUM 10.0 01/22/2015 1035   CALCIUM 9.7 01/04/2015 1125   PROT 7.5 01/22/2015 1035   ALBUMIN 4.2 01/22/2015 1035   AST 39* 01/22/2015 1035   ALT 54 01/22/2015 1035   ALKPHOS 49 01/22/2015 1035   BILITOT 0.37 01/22/2015 1035   GFRNONAA 88 01/04/2015 1125   GFRAA 102 01/04/2015 1125    I No results found for: SPEP  Lab Results  Component Value Date   WBC 5.4 01/22/2015   NEUTROABS 2.9 01/22/2015   HGB 13.4 01/22/2015   HCT 40.9 01/22/2015   MCV 96.9 01/22/2015   PLT 256 01/22/2015      Chemistry      Component Value Date/Time   NA 139 01/22/2015 1035   NA 142 01/04/2015 1125   NA 136 04/16/2014 1040   K 5.4* 01/22/2015 1035   K 5.2 01/04/2015 1125   CL 102 01/04/2015 1125   CO2 21* 01/22/2015 1035   CO2 22 01/04/2015 1125   BUN 15.9 01/22/2015 1035   BUN 12 01/04/2015 1125   BUN 11 04/16/2014 1040   CREATININE 0.8 01/22/2015 1035   CREATININE 0.71 01/04/2015 1125      Component Value Date/Time   CALCIUM 10.0 01/22/2015 1035   CALCIUM 9.7 01/04/2015 1125   ALKPHOS 49 01/22/2015 1035   AST 39* 01/22/2015 1035   ALT 54 01/22/2015 1035   BILITOT 0.37 01/22/2015 1035       No results found for: LABCA2  No components found for: LABCA125  No results for input(s): INR in the last 168 hours.  Urinalysis No results found for: COLORURINE  STUDIES: No results found.  ASSESSMENT: 69 y.o. [place] woman status post right breast biopsy 06/02/2013 for a clinical T2 N0, stage IIA invasive lobular breast cancer, grade 2, estrogen receptor and progesterone receptor positive, with an MIB-1 of 80%, HER-2 obtained from definitive surgery  (1) status post right mastectomy and sentinel lymph node sampling 07/04/2013 for a pT3, pN0, stage IIB  invasive lobular carcinoma, grade 2, HER-2 negative  (2) Oncotype score of 6 predicts a risk of outside the breast recurrence within 10 years and 5% of the patient's only systemic treatment is tamoxifen for  5 years. It also predicts no benefit from chemotherapy  (3) tamoxifen started 09/15/2013  (4) underwent TRAM flap reconstruction 12/30/2013  PLAN: Deklynn is doing well as far as her breast cancer is concerned. She is now 1.5 years out from her definitive surgery with no evidence of recurrent disease. The labs were reviewed in detail and were stable. She is tolerating the tamoxifen well and will continue this drug for at least another 6 months before deciding to switch to an aromatase inhibitor.   She will work on being more regularly physically active as a means to spur some energy.   Bethzaida will return in May for follow up, after her mammogram in April. She understands and agrees with this plan. She knows the goal of treatment in her case is cure. She has been encouraged to call with any issues that might arise before her next visi there.    Laurie Panda, NP   01/22/2015 12:23 PM

## 2015-05-12 ENCOUNTER — Other Ambulatory Visit: Payer: Self-pay | Admitting: Pediatrics

## 2015-05-12 DIAGNOSIS — M810 Age-related osteoporosis without current pathological fracture: Secondary | ICD-10-CM

## 2015-05-12 MED ORDER — RISEDRONATE SODIUM 150 MG PO TABS: 150.0000 mg | ORAL_TABLET | ORAL | Status: DC

## 2015-05-14 ENCOUNTER — Other Ambulatory Visit: Payer: Self-pay

## 2015-05-14 DIAGNOSIS — Z9011 Acquired absence of right breast and nipple: Secondary | ICD-10-CM

## 2015-05-14 DIAGNOSIS — Z1231 Encounter for screening mammogram for malignant neoplasm of breast: Secondary | ICD-10-CM

## 2015-06-17 ENCOUNTER — Ambulatory Visit
Admission: RE | Admit: 2015-06-17 | Discharge: 2015-06-17 | Disposition: A | Discharge status: Home / Self Care | Payer: Medicare Other | Source: Ambulatory Visit

## 2015-06-17 DIAGNOSIS — Z1231 Encounter for screening mammogram for malignant neoplasm of breast: Secondary | ICD-10-CM | POA: Diagnosis not present

## 2015-06-17 DIAGNOSIS — Z9011 Acquired absence of right breast and nipple: Secondary | ICD-10-CM

## 2015-07-01 ENCOUNTER — Ambulatory Visit: Payer: Medicare Other | Admitting: Oncology

## 2015-07-01 ENCOUNTER — Other Ambulatory Visit: Payer: Medicare Other

## 2015-07-26 ENCOUNTER — Other Ambulatory Visit: Payer: Self-pay | Admitting: *Deleted

## 2015-07-26 DIAGNOSIS — C50411 Malignant neoplasm of upper-outer quadrant of right female breast: Secondary | ICD-10-CM

## 2015-07-27 ENCOUNTER — Telehealth: Payer: Self-pay | Admitting: Oncology

## 2015-07-27 ENCOUNTER — Ambulatory Visit (HOSPITAL_BASED_OUTPATIENT_CLINIC_OR_DEPARTMENT_OTHER): Payer: Medicare Other | Admitting: Oncology

## 2015-07-27 ENCOUNTER — Other Ambulatory Visit (HOSPITAL_BASED_OUTPATIENT_CLINIC_OR_DEPARTMENT_OTHER): Payer: Medicare Other

## 2015-07-27 VITALS — BP 145/94 | HR 73 | Temp 97.9°F | Resp 18 | Ht 59.0 in | Wt 133.1 lb

## 2015-07-27 DIAGNOSIS — C50911 Malignant neoplasm of unspecified site of right female breast: Secondary | ICD-10-CM

## 2015-07-27 DIAGNOSIS — M8580 Other specified disorders of bone density and structure, unspecified site: Secondary | ICD-10-CM | POA: Diagnosis not present

## 2015-07-27 DIAGNOSIS — C50411 Malignant neoplasm of upper-outer quadrant of right female breast: Secondary | ICD-10-CM

## 2015-07-27 LAB — CBC WITH DIFFERENTIAL/PLATELET
BASO%: 0.7 % (ref 0.0–2.0)
BASOS ABS: 0 10*3/uL (ref 0.0–0.1)
EOS%: 1.7 % (ref 0.0–7.0)
Eosinophils Absolute: 0.1 10*3/uL (ref 0.0–0.5)
HCT: 40.5 % (ref 34.8–46.6)
HEMOGLOBIN: 13.3 g/dL (ref 11.6–15.9)
LYMPH%: 34.3 % (ref 14.0–49.7)
MCH: 31.8 pg (ref 25.1–34.0)
MCHC: 32.8 g/dL (ref 31.5–36.0)
MCV: 96.9 fL (ref 79.5–101.0)
MONO#: 0.6 10*3/uL (ref 0.1–0.9)
MONO%: 10.9 % (ref 0.0–14.0)
NEUT%: 52.4 % (ref 38.4–76.8)
NEUTROS ABS: 2.8 10*3/uL (ref 1.5–6.5)
PLATELETS: 277 10*3/uL (ref 145–400)
RBC: 4.18 10*6/uL (ref 3.70–5.45)
RDW: 12.5 % (ref 11.2–14.5)
WBC: 5.4 10*3/uL (ref 3.9–10.3)
lymph#: 1.9 10*3/uL (ref 0.9–3.3)

## 2015-07-27 LAB — COMPREHENSIVE METABOLIC PANEL
ALBUMIN: 4.1 g/dL (ref 3.5–5.0)
ALK PHOS: 50 U/L (ref 40–150)
ALT: 44 U/L (ref 0–55)
ANION GAP: 9 meq/L (ref 3–11)
AST: 33 U/L (ref 5–34)
BILIRUBIN TOTAL: 0.43 mg/dL (ref 0.20–1.20)
BUN: 13 mg/dL (ref 7.0–26.0)
CO2: 23 mEq/L (ref 22–29)
Calcium: 9.6 mg/dL (ref 8.4–10.4)
Chloride: 107 mEq/L (ref 98–109)
Creatinine: 0.8 mg/dL (ref 0.6–1.1)
EGFR: 73 mL/min/{1.73_m2} — AB (ref >90)
Glucose: 100 mg/dl (ref 70–140)
POTASSIUM: 4.3 meq/L (ref 3.5–5.1)
Sodium: 139 mEq/L (ref 136–145)
Total Protein: 7.4 g/dL (ref 6.4–8.3)

## 2015-07-27 MED ORDER — TAMOXIFEN CITRATE 20 MG PO TABS: 20.0000 mg | ORAL_TABLET | Freq: Every day | ORAL | Status: DC

## 2015-07-27 NOTE — Telephone Encounter (Signed)
appt made and avs printed °

## 2015-07-27 NOTE — Progress Notes (Signed)
[facility]  Telephone:(336) [phone] Fax:(336) [phone]     ID: Penny Howard OB: Aug 27, 1945  MR#: [id number]  YSH#:[id number]  PCP: Eustaquio Maize, MD GYN:   SU: Excell Seltzer OTHER MD: Kyung Rudd, Arnoldo Hooker Thimmappa  CHIEF COMPLAINT: Estrogen receptor positive breast cancer  CURRENT TREATMENT: Tamoxifen  BREAST CANCER HISTORY: From the original intake note:  Penny Howard was bending over to get into her hot tub when she noted a mass in her right breast. She intended to keep it to herself, but her husband of 70 years, Penny Howard, noted her changed expression, asked her what it was about, and when she told him they got out of the top and called on her primary care physician. On 06/02/2013 the patient underwent right breast mammography and ultrasonography,  which showed an area of increased density in the superior aspect of the right breast, which was palpable and firm. By palpation it measured approximately 5 cm. Ultrasound located an irregular mass in the right breast at the 12:00 position measuring 4 cm maximally. Ultrasound of the right axilla showed normal axillary contents.  Biopsy of the right breast mass in question 06/02/2013 ([id number]) confirmed an invasive lobular tumor, grade 2, estrogen and progesterone receptors both 100% positive in both with strong staining intensity, with an MIB-1 of 80%. HER-2 is pending.  On 06/03/2013 the patient underwent left breast mammography, which was negative. On 06/09/2013 the patient underwent bilateral breast MRI. This showed, in the left breast, multiple enhancing nodules in the central portion measuring up to 4.6 cm in aggregate. There was evidence of nipple retraction. The largest single  discrete mass measured 2.7 cm. There was no involvement of the pectoralis and no abnormal appearing lymph nodes. The right breast was unremarkable.  The patient's subsequent history is as detailed below  INTERVAL HISTORY: Penny Howard today for  followup of her estrogen receptor positive breast cancer. She continues on tamoxifen, which she tolerates well. He has occasional hot flashes. They don't wake her up at night. She does not have vaginal wetness problems. She obtains the tamoxifen at $10 for 3 month supply.  REVIEW OF SYSTEMS: She is exercising by walking 30 minutes a day, mostly around the neighborhood but occasionally on the treadmill. A detailed review of systems today is otherwise oncontributory  PAST MEDICAL HISTORY: Past Medical History  Diagnosis Date  . Hypertension   . GERD (gastroesophageal reflux disease)   . High cholesterol   . Anxiety   . Cancer ([place])     breast rt  . Stress incontinence   . Allergy   . Osteoporosis   . Wears contact lenses     PAST SURGICAL HISTORY: Past Surgical History  Procedure Laterality Date  . Bladder surgery  10    mesh  . Mastectomy w/ sentinel node biopsy  07/04/2013    RT TOTAL            DR HOXWORTH  . Simple mastectomy with axillary sentinel node biopsy Right 07/04/2013    Procedure: RIGHT TOTAL MASTECTOMY WITH RIGHT  AXILLARY SENTINEL LYMPH NODE BIOPSY;  Surgeon: Edward Jolly, MD;  Location: [facility];  Service: General;  Laterality: Right;  . Abdominal hysterectomy  1990    ovaries intact b/l  . Tram Right 12/30/2013    Procedure: TRANSVERSE RECTUS ABDOMINIS MYOCUTANEOUS (TRAM) FLAP FOR RIGHT BREAST RECONSTRUCTION;  Surgeon: Irene Limbo, MD;  Location: [facility];  Service: Plastics;  Laterality: Right;  . Colonoscopy    . Liposuction  with lipofilling Right 04/24/2014    Procedure: LIPOFILLING TO RIGHT CHEST/RIGHT NIPPLE AREOLA COMPLEX CREATION WITH FULL THICKNESS SKIN GRAFT/REVISION OF RIGHT TRANSFLAP;  Surgeon: Irene Limbo, MD;  Location: [facility];  Service: Plastics;  Laterality: Right;  . Skin full thickness graft Right 04/24/2014    Procedure: SKIN GRAFT FULL THICKNESS;  Surgeon: Irene Limbo, MD;  Location: [facility];   Service: Plastics;  Laterality: Right;  . Scar revision Right 04/24/2014    Procedure: SCAR REVISION;  Surgeon: Irene Limbo, MD;  Location: [facility];  Service: Plastics;  Laterality: Right;    FAMILY HISTORY Family History  Problem Relation Age of Onset  . Breast cancer Mother   . Breast cancer Paternal Aunt   . Lymphoma Cousin     non- hodgkins lymphoma   the patient's father died with congestive heart failure at the age of 22. The patient's mother died from "multiple causes" at the age of 109. Penny Howard had one brother, no sisters. The patient's mother was diagnosed with breast cancer the age of 38 there is also a paternal aunt diagnosed with breast cancer at the age of 1.  GYNECOLOGIC HISTORY:  Menarche age 7, first live birth age 83, the patient is Penny Howard. She stopped having periods approximately 1990. She did not take hormone replacement. 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 Their name is pronounced "Nam-zura"--it is of [place] origin. Daughter Penny Howard lives in [place] for she is a Nurse, children's and daughter Penny Howard lives in [place] where she is a housewife. The patient has 1 grandchild. She attends a [employer].    ADVANCED DIRECTIVES: Not in place   HEALTH MAINTENANCE: Social History  Substance Use Topics  . Smoking status: Never Smoker   . Smokeless tobacco: Never Used  . Alcohol Use: 3.0 oz/week    5 Glasses of wine per week     Comment: occaionally     Colonoscopy: 2001?/Dr. Collene Mares  PAP:  Bone density: 2014/osteopenia  Lipid panel:  Allergies  Allergen Reactions  . Codeine Nausea Only    Current Outpatient Prescriptions  Medication Sig Dispense Refill  . escitalopram (LEXAPRO) 10 MG tablet Take 1 tablet (10 mg total) by mouth daily. 90  tablet 3  . esomeprazole (NEXIUM) 20 MG capsule Take 20 mg by mouth daily at 12 noon.    Marland Kitchen lisinopril (PRINIVIL,ZESTRIL) 10 MG tablet Take 1 tablet (10 mg total) by mouth daily. 30 tablet 6  . multivitamin-lutein (OCUVITE-LUTEIN) CAPS capsule Take 1 capsule by mouth daily. 250 mg capsule    . Red Yeast Rice 600 MG CAPS Take 600 mg by mouth daily.    . risedronate (ACTONEL) 150 MG tablet Take 1 tablet (150 mg total) by mouth every 30 (thirty) days. with water on empty stomach, nothing by mouth or lie down for next 30 minutes. 3 tablet 3  . tamoxifen (NOLVADEX) 20 MG tablet TAKE 1 TABLET DAILY 90 tablet 3   No current facility-administered medications for this visit.    OBJECTIVE: Middle-aged white woman who appears well Filed Vitals:   07/27/15 0929  BP: 145/94  Pulse: 73  Temp: 97.9 F (36.6 C)  Resp: 18     Body mass index is 26.87 kg/(m^2).    ECOG FS:0 - Asymptomatic   Sclerae  unicteric, pupils round and equal Oropharynx clear and moist-- no thrush or other lesions No cervical or supraclavicular adenopathy Lungs no rales or rhonchi Heart regular rate and rhythm Abd soft, nontender, positive bowel sounds MSK no focal spinal tenderness, no upper extremity lymphedema Neuro: nonfocal, well oriented, appropriate affect Breasts: the right breast is status post mastectomy with TRAM reconstruction. In the superior aspect at there is an area of approximately 3 cm in circumference, which isfirmer than the rest, and has been present for more than a year without any change. This is scar tissue.    LAB RESULTS:  CMP     Component Value Date/Time   NA 139 07/27/2015 0917   NA 142 01/04/2015 1125   NA 136 04/16/2014 1040   K 4.3 07/27/2015 0917   K 5.2 01/04/2015 1125   CL 102 01/04/2015 1125   CO2 23 07/27/2015 0917   CO2 22 01/04/2015 1125   GLUCOSE 100 07/27/2015 0917   GLUCOSE 107* 01/04/2015 1125   GLUCOSE 101* 04/16/2014 1040   BUN 13.0 07/27/2015 0917   BUN 12 01/04/2015  1125   BUN 11 04/16/2014 1040   CREATININE 0.8 07/27/2015 0917   CREATININE 0.71 01/04/2015 1125   CALCIUM 9.6 07/27/2015 0917   CALCIUM 9.7 01/04/2015 1125   PROT 7.4 07/27/2015 0917   ALBUMIN 4.1 07/27/2015 0917   AST 33 07/27/2015 0917   ALT 44 07/27/2015 0917   ALKPHOS 50 07/27/2015 0917   BILITOT 0.43 07/27/2015 0917   GFRNONAA 88 01/04/2015 1125   GFRAA 102 01/04/2015 1125    I No results found for: SPEP  Lab Results  Component Value Date   WBC 5.4 07/27/2015   NEUTROABS 2.8 07/27/2015   HGB 13.3 07/27/2015   HCT 40.5 07/27/2015   MCV 96.9 07/27/2015   PLT 277 07/27/2015      Chemistry      Component Value Date/Time   NA 139 07/27/2015 0917   NA 142 01/04/2015 1125   NA 136 04/16/2014 1040   K 4.3 07/27/2015 0917   K 5.2 01/04/2015 1125   CL 102 01/04/2015 1125   CO2 23 07/27/2015 0917   CO2 22 01/04/2015 1125   BUN 13.0 07/27/2015 0917   BUN 12 01/04/2015 1125   BUN 11 04/16/2014 1040   CREATININE 0.8 07/27/2015 0917   CREATININE 0.71 01/04/2015 1125      Component Value Date/Time   CALCIUM 9.6 07/27/2015 0917   CALCIUM 9.7 01/04/2015 1125   ALKPHOS 50 07/27/2015 0917   AST 33 07/27/2015 0917   ALT 44 07/27/2015 0917   BILITOT 0.43 07/27/2015 0917       No results found for: LABCA2  No components found for: LABCA125  No results for input(s): INR in the last 168 hours.  Urinalysis No results found for: COLORURINE  STUDIES: CLINICAL DATA: Screening.  EXAM: 2D DIGITAL SCREENING UNILATERAL LEFT MAMMOGRAM WITH CAD AND ADJUNCT TOMO  COMPARISON: Previous exam(s).  ACR Breast Density Category b: There are scattered areas of fibroglandular density.  FINDINGS: The patient has had a right mastectomy. There are no findings suspicious for malignancy.  Images were processed with CAD.  IMPRESSION: No mammographic evidence of malignancy. A result letter of this screening mammogram will be mailed directly to the  patient.  RECOMMENDATION: Screening mammogram in one year. (Code:SM-L-42M)  BI-RADS CATEGORY 1: Negative.   Electronically Signed  By: Nolon Nations M.D.  On: 06/18/2015 08:22    ASSESSMENT: 70 y.o. [place] woman status post  right breast biopsy 06/02/2013 for a clinical T2 N0, stage IIA invasive lobular breast cancer, grade 2, estrogen receptor and progesterone receptor positive, with an MIB-1 of 80%, HER-2 obtained from definitive surgery  (1) status post right mastectomy and sentinel lymph node sampling 07/04/2013 for a pT3, pN0, stage IIB invasive lobular carcinoma, grade 2, HER-2 negative  (2) Oncotype score of 6 predicts a risk of outside the breast recurrence within 10 years and 5% of the patient's only systemic treatment is tamoxifen for 5 years. It also predicts no benefit from chemotherapy  (3) tamoxifen started 09/15/2013  (4) underwent TRAM flap reconstruction 12/30/2013  PLAN: Darely is ow 2 years out from definitive surgery for her breast cancer with no evidence of disease recurrence. This is very favorable.  She is tolerating tamoxifen well. The plan will be to continue that for total of 5 years.  She will have her 52th wedding anniversary this August and they're planning a trip out [place] 2 celebrate that.  At this point I'm comfortable seeing her on a once a year basis. I will see her next May, after April mammogram. She knows to call for any problems that may develop before that visit.    Chauncey Cruel, MD   07/27/2015 10:03 AM

## 2015-09-09 ENCOUNTER — Ambulatory Visit (INDEPENDENT_AMBULATORY_CARE_PROVIDER_SITE_OTHER): Payer: Medicare Other | Admitting: Pediatrics

## 2015-09-09 ENCOUNTER — Encounter: Payer: Self-pay | Admitting: Pediatrics

## 2015-09-09 VITALS — BP 135/78 | HR 87 | Temp 97.2°F | Ht 59.0 in | Wt 135.2 lb

## 2015-09-09 DIAGNOSIS — R195 Other fecal abnormalities: Secondary | ICD-10-CM | POA: Diagnosis not present

## 2015-09-09 DIAGNOSIS — I10 Essential (primary) hypertension: Secondary | ICD-10-CM

## 2015-09-09 DIAGNOSIS — M159 Polyosteoarthritis, unspecified: Secondary | ICD-10-CM

## 2015-09-09 DIAGNOSIS — K2100 Gastro-esophageal reflux disease with esophagitis, without bleeding: Secondary | ICD-10-CM

## 2015-09-09 DIAGNOSIS — Z1211 Encounter for screening for malignant neoplasm of colon: Secondary | ICD-10-CM

## 2015-09-09 DIAGNOSIS — M810 Age-related osteoporosis without current pathological fracture: Secondary | ICD-10-CM

## 2015-09-09 DIAGNOSIS — C50411 Malignant neoplasm of upper-outer quadrant of right female breast: Secondary | ICD-10-CM

## 2015-09-09 DIAGNOSIS — M25552 Pain in left hip: Secondary | ICD-10-CM

## 2015-09-09 DIAGNOSIS — F411 Generalized anxiety disorder: Secondary | ICD-10-CM | POA: Diagnosis not present

## 2015-09-09 MED ORDER — LISINOPRIL 10 MG PO TABS: 10.0000 mg | ORAL_TABLET | Freq: Every day | ORAL | Status: DC

## 2015-09-09 MED ORDER — ESCITALOPRAM OXALATE 10 MG PO TABS: 10.0000 mg | ORAL_TABLET | Freq: Every day | ORAL | Status: DC

## 2015-09-09 MED ORDER — RISEDRONATE SODIUM 150 MG PO TABS: 150.0000 mg | ORAL_TABLET | ORAL | Status: DC
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
## 2015-10-05 DIAGNOSIS — Z1211 Encounter for screening for malignant neoplasm of colon: Secondary | ICD-10-CM | POA: Diagnosis not present

## 2015-10-05 DIAGNOSIS — R194 Change in bowel habit: Secondary | ICD-10-CM | POA: Diagnosis not present

## 2015-10-05 DIAGNOSIS — R140 Abdominal distension (gaseous): Secondary | ICD-10-CM | POA: Diagnosis not present

## 2015-10-27 DIAGNOSIS — R194 Change in bowel habit: Secondary | ICD-10-CM | POA: Diagnosis not present

## 2015-10-27 DIAGNOSIS — Z1211 Encounter for screening for malignant neoplasm of colon: Secondary | ICD-10-CM | POA: Diagnosis not present

## 2016-01-28 IMAGING — MG MM DIGITAL DIAGNOSTIC UNILAT*R*
3 series · 3 of 3 positions shown · non-contrast
Comparison: 10/23/2012, 08/31/2011, 07/14/2010, 04/20/2009.

CLINICAL DATA: Palpable right breast mass. Family history of breast
cancer in her sister at age 60.

EXAM:
DIGITAL DIAGNOSTIC  right breast MAMMOGRAM WITH CAD
ULTRASOUND right BREAST

[R CC]
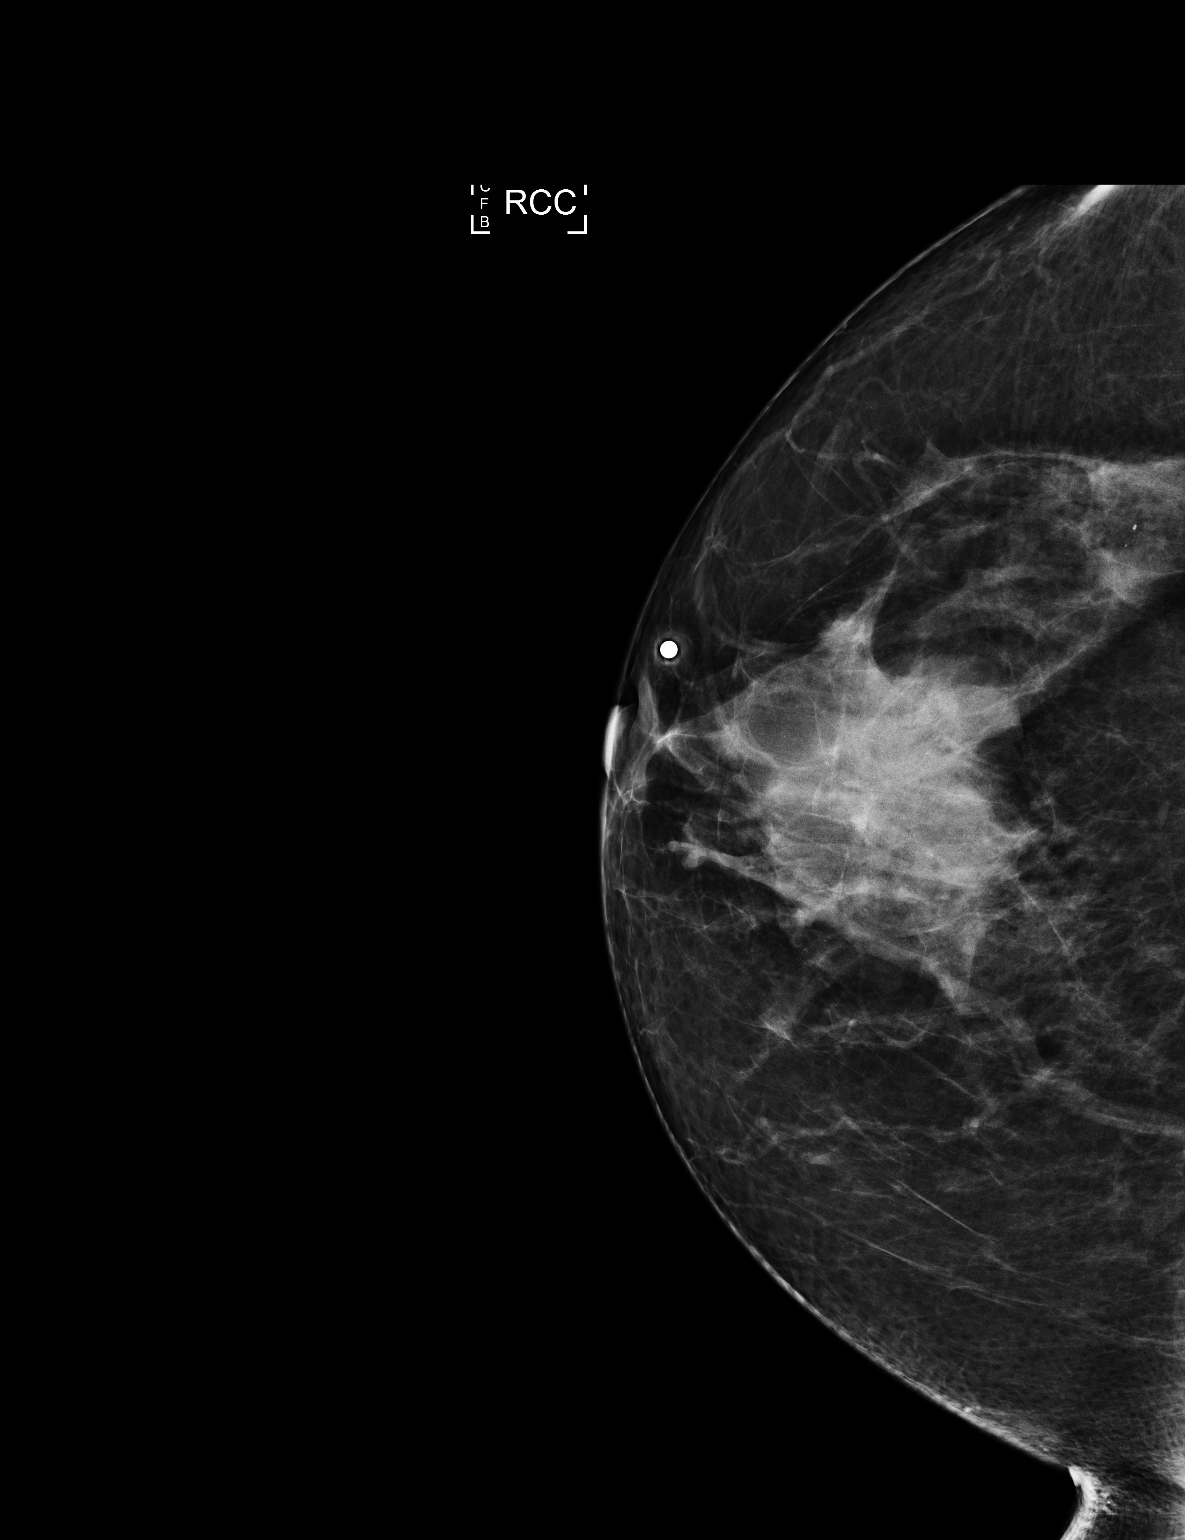

[R TAN]
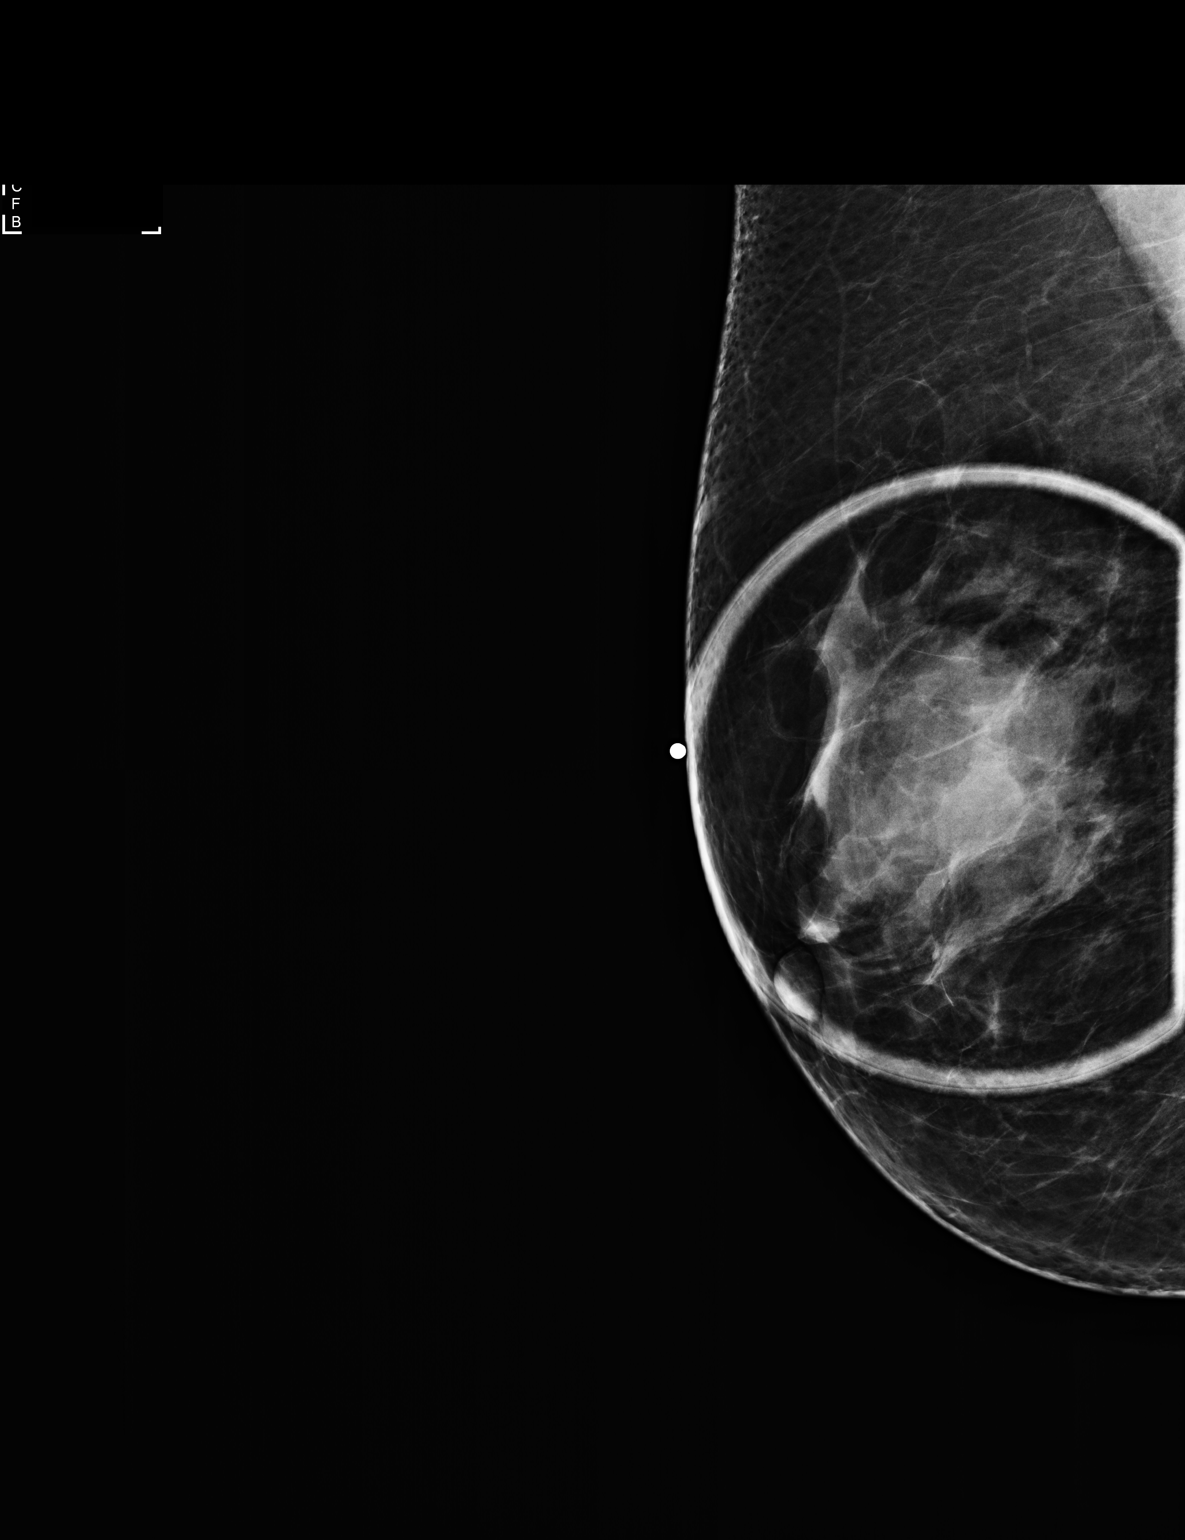

[R MLO]
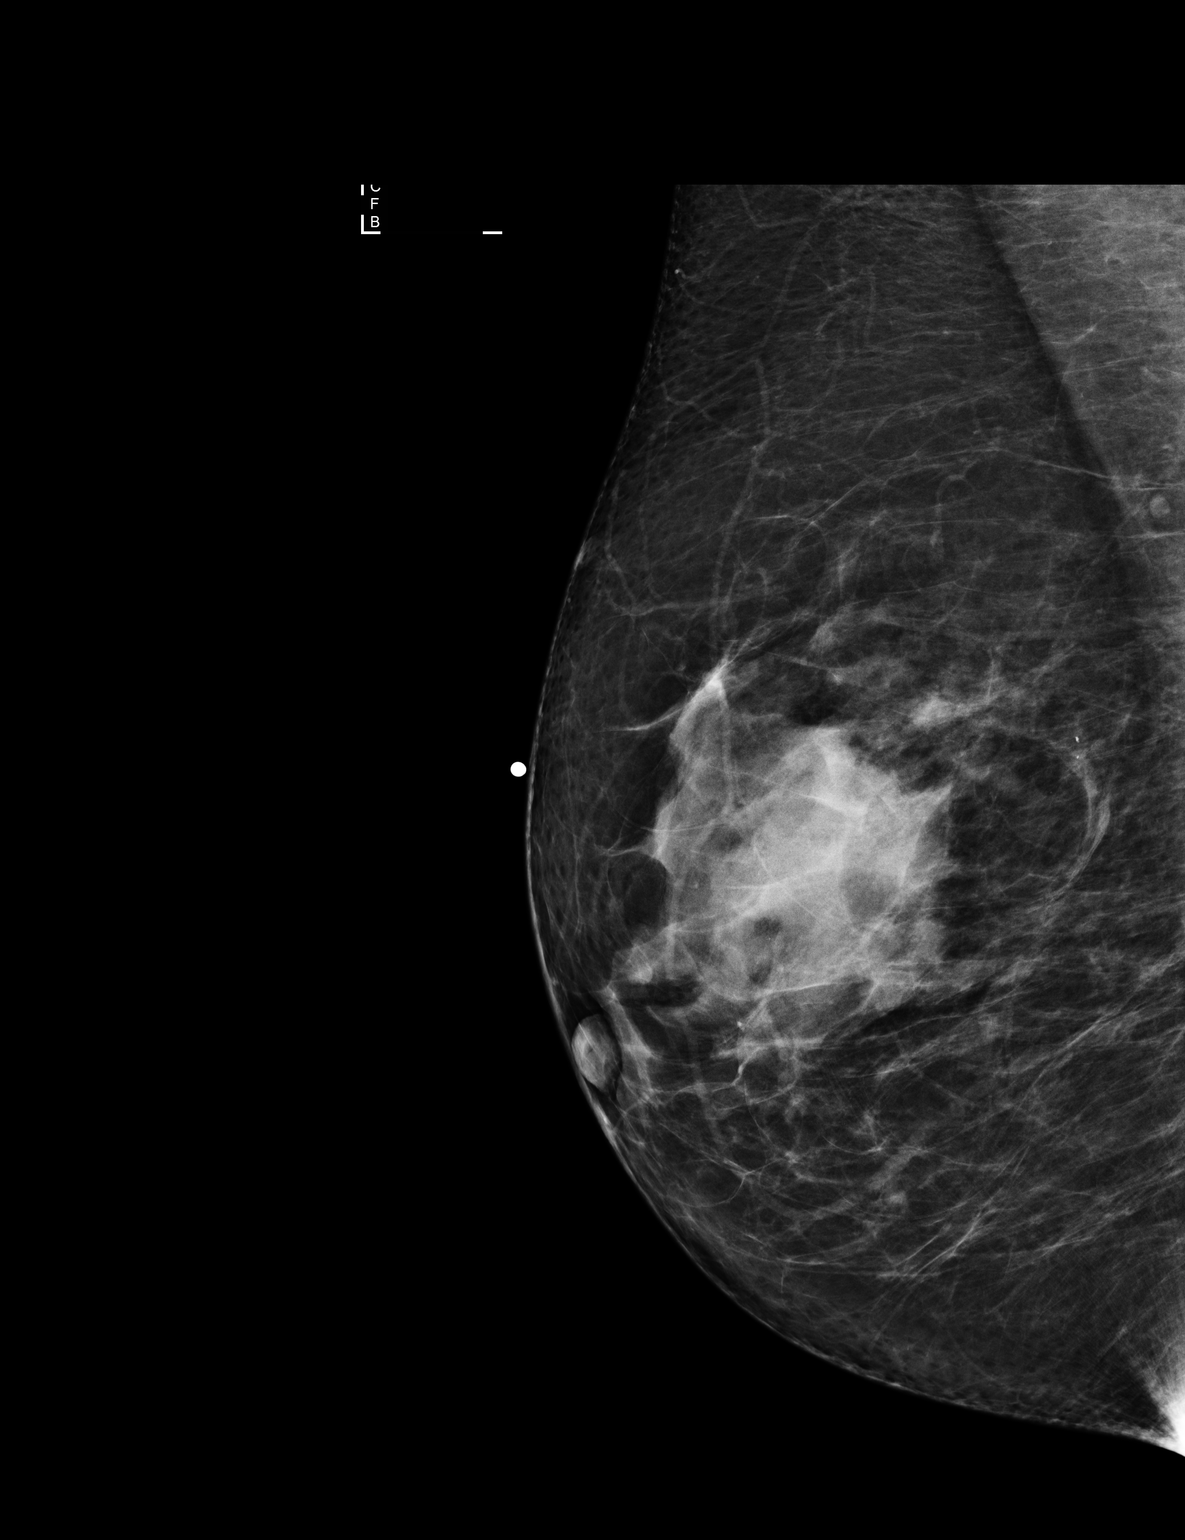

[3 of 3 positions shown; findings below may reference images not displayed]

ACR Breast Density Category c: The breast tissue is heterogeneously
dense, which may obscure small masses.
FINDINGS: There is an area of focal increased density seen within the superior
subareolar portion of the right breast located at the 12 o'clock
position.

Mammographic images were processed with CAD.

On physical exam, there is a firm palpable mass located within the
right breast at 12 o'clock position 4 cm from the nipple which by
physical examination measures approximately 5 cm in size. There is
no palpable right axillary adenopathy.

Ultrasound is performed, showing a heterogeneous, irregular mass
located within the right breast at 12 o'clock position 4 cm from the
nipple measuring 4.0 x 3.7 x 2.7 cm in size. This is associated with
increased vascularity on color-flow imaging. This is worrisome for
invasive mammary carcinoma.

Ultrasound of the right axilla demonstrates normal axillary contents
and no evidence for adenopathy.

I have discussed ultrasound-guided core biopsy of the right breast
mass located at 12 o'clock position. The patient would like to
proceed with this at this time. This will be performed and reported
separately.
IMPRESSION: 4 cm suspicious mass located within the right breast at 12 o'clock
position as discussed above. Tissue sampling is recommended and
ultrasound-guided core biopsy will be performed and reported
separately.

RECOMMENDATION:
Right breast ultrasound-guided core biopsy.

I have discussed the findings and recommendations with the patient.
Results were also provided in writing at the conclusion of the
visit. If applicable, a reminder letter will be sent to the patient
regarding the next appointment.

BI-RADS CATEGORY  4: Suspicious abnormality - biopsy should be
considered.

## 2016-01-29 IMAGING — MG MM DIGITAL DIAGNOSTIC UNILAT*L*
3 series · 3 of 3 positions shown · non-contrast
Comparison: Previous examinations dated 10/23/2012, 08/31/2011,
07/14/2010, 04/20/2009.

CLINICAL DATA: Recently diagnosed right breast invasive mammary
carcinoma. Pre MRI left breast mammogram requested.

EXAM:
DIGITAL DIAGNOSTIC  left breast MAMMOGRAM WITH CAD

[L CC]
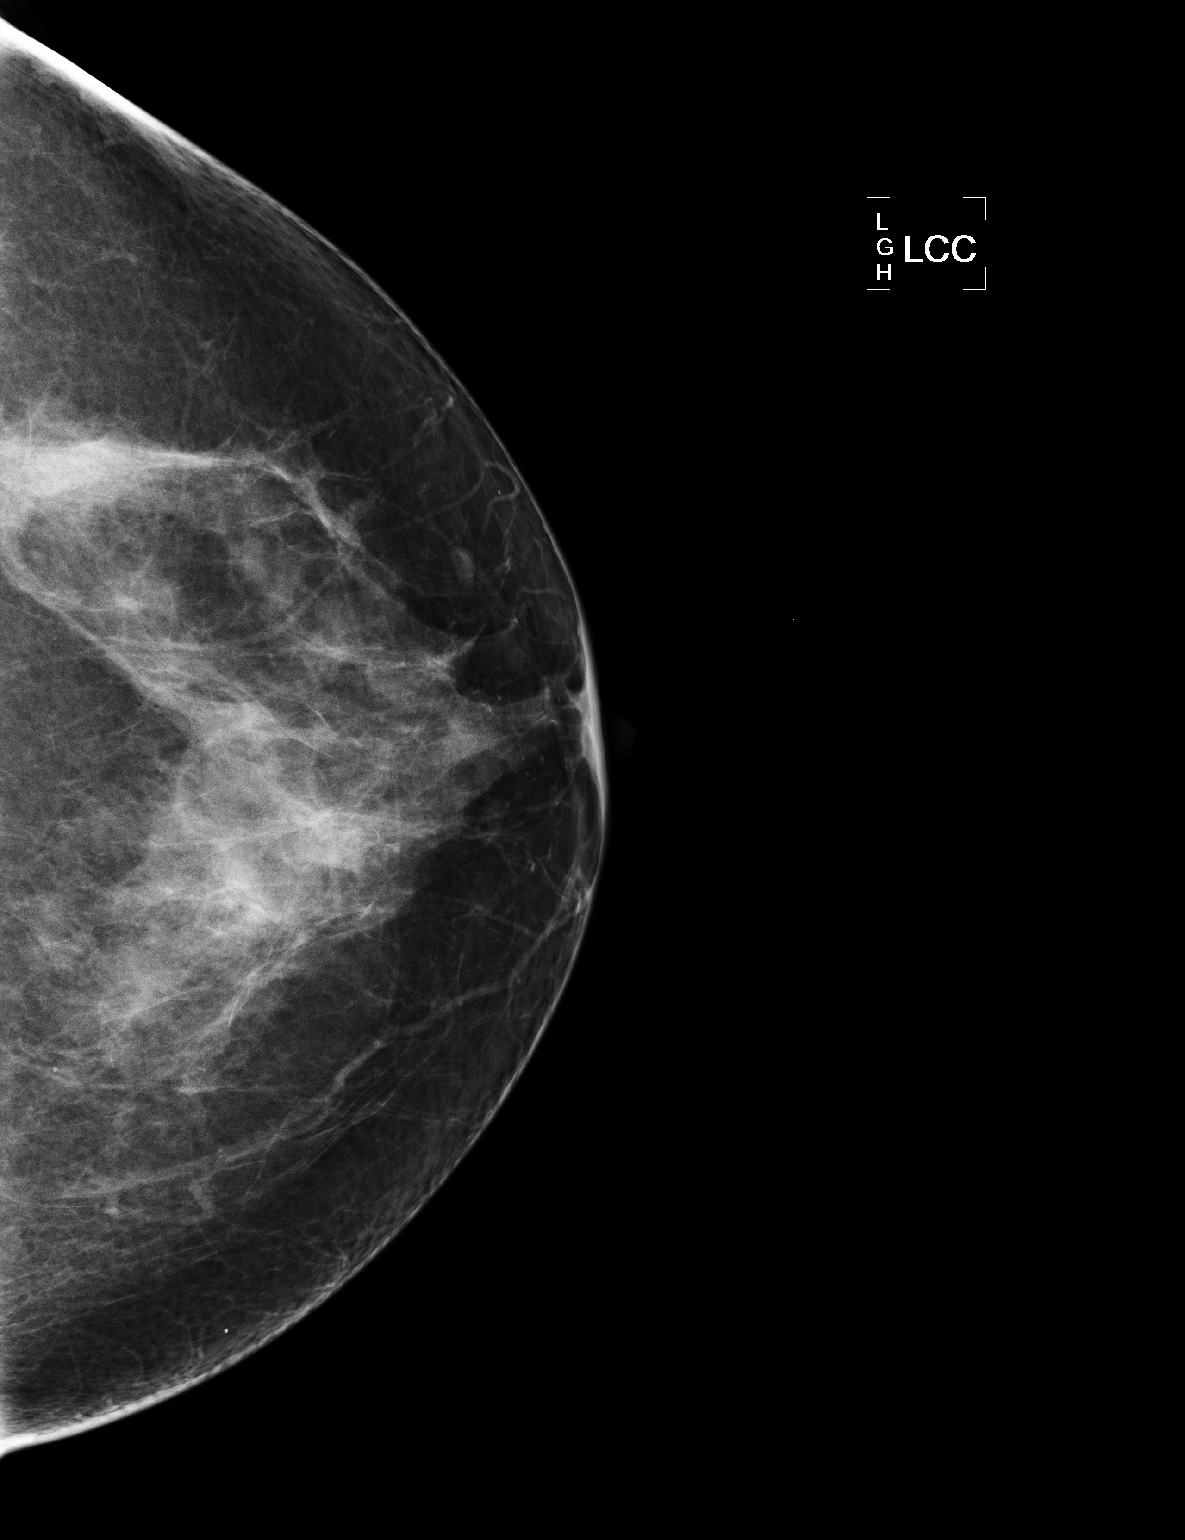

[L MLO (1 of 2)]
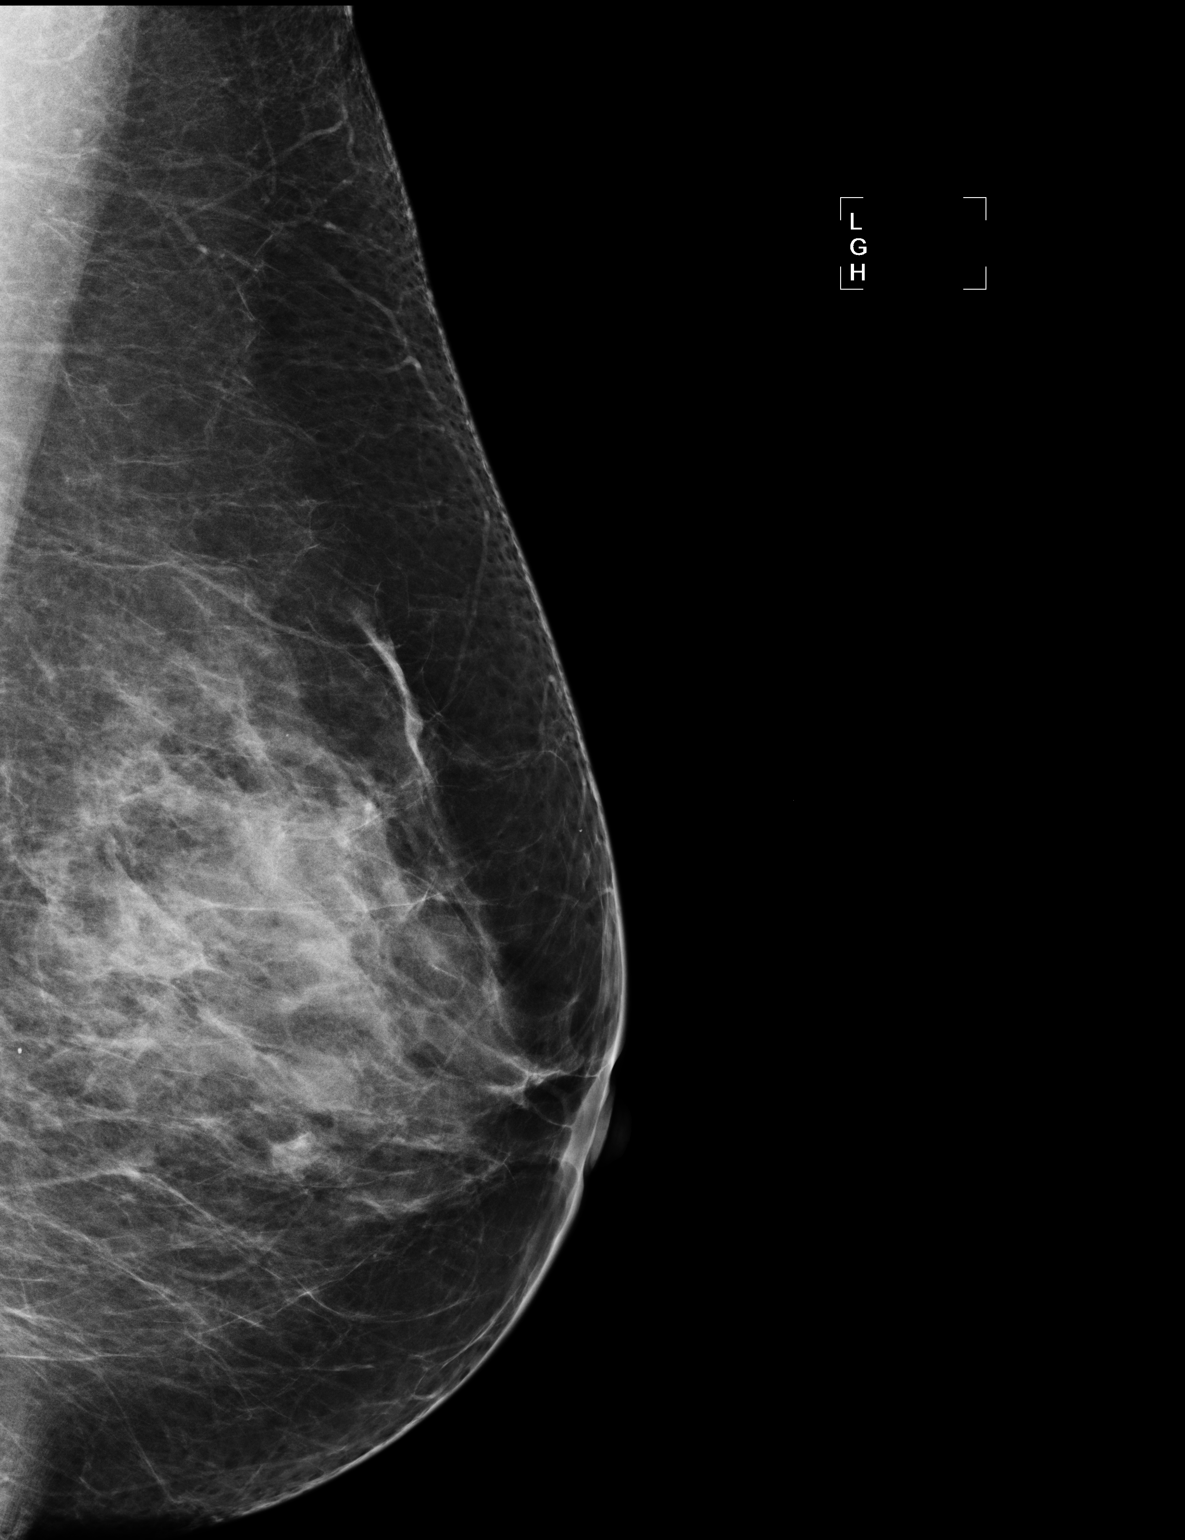

[L MLO (2 of 2)]
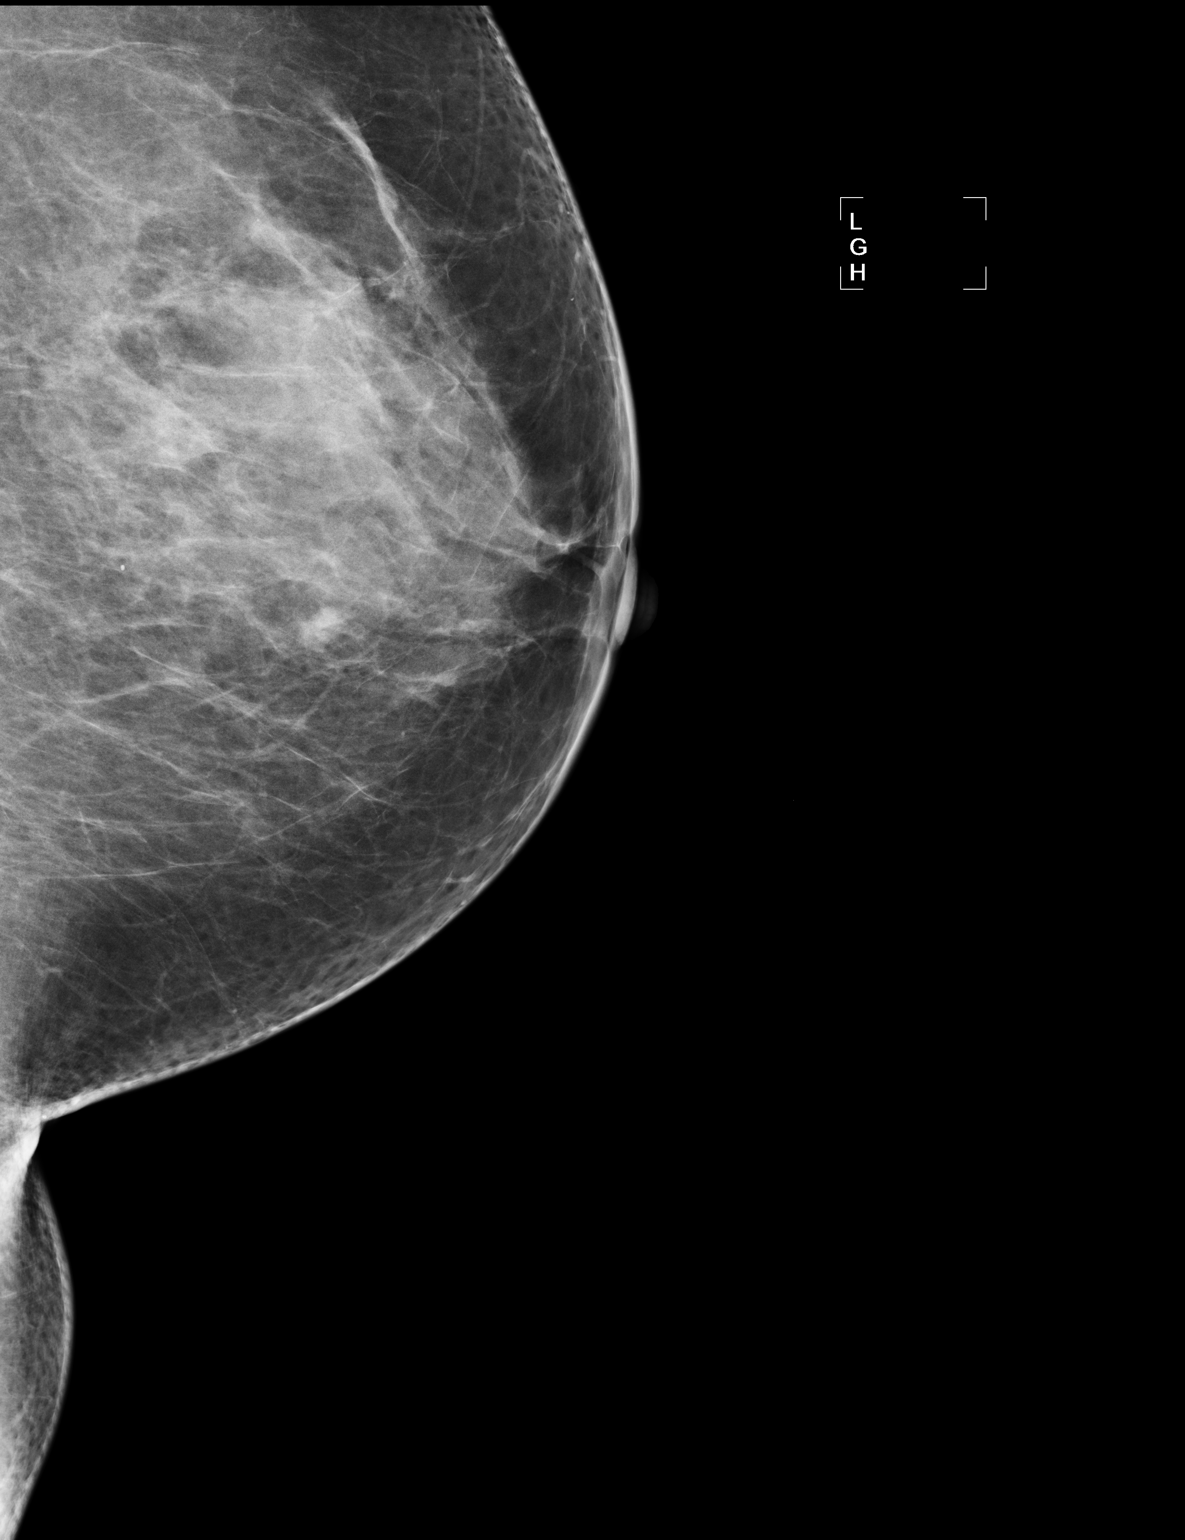

[3 of 3 positions shown; findings below may reference images not displayed]

ACR Breast Density Category c: The breast tissue is heterogeneously
dense, which may obscure small masses.
FINDINGS: There is no mass, distortion, or worrisome calcification within the
left breast.

Mammographic images were processed with CAD.
IMPRESSION: Stable left breast parenchymal pattern. No findings worrisome for
malignancy within the left breast.

RECOMMENDATION:
Preoperative breast MRI. Treatment plan for known malignancy within
the right breast.

I have discussed the findings and recommendations with the patient.
Results were also provided in writing at the conclusion of the
visit. If applicable, a reminder letter will be sent to the patient
regarding the next appointment.

BI-RADS CATEGORY  1: Negative.

## 2016-03-01 DIAGNOSIS — Z23 Encounter for immunization: Secondary | ICD-10-CM | POA: Diagnosis not present

## 2016-03-01 DIAGNOSIS — L710 Perioral dermatitis: Secondary | ICD-10-CM | POA: Diagnosis not present

## 2016-03-01 DIAGNOSIS — L821 Other seborrheic keratosis: Secondary | ICD-10-CM | POA: Diagnosis not present

## 2016-03-01 DIAGNOSIS — D485 Neoplasm of uncertain behavior of skin: Secondary | ICD-10-CM | POA: Diagnosis not present

## 2016-03-01 DIAGNOSIS — L570 Actinic keratosis: Secondary | ICD-10-CM | POA: Diagnosis not present

## 2016-03-03 DIAGNOSIS — C44511 Basal cell carcinoma of skin of breast: Secondary | ICD-10-CM | POA: Diagnosis not present

## 2016-03-03 DIAGNOSIS — D225 Melanocytic nevi of trunk: Secondary | ICD-10-CM | POA: Diagnosis not present

## 2016-04-04 DIAGNOSIS — L570 Actinic keratosis: Secondary | ICD-10-CM | POA: Diagnosis not present

## 2016-05-01 DIAGNOSIS — C44519 Basal cell carcinoma of skin of other part of trunk: Secondary | ICD-10-CM | POA: Diagnosis not present

## 2016-05-01 DIAGNOSIS — D485 Neoplasm of uncertain behavior of skin: Secondary | ICD-10-CM | POA: Diagnosis not present

## 2016-05-03 DIAGNOSIS — D225 Melanocytic nevi of trunk: Secondary | ICD-10-CM | POA: Diagnosis not present

## 2016-07-04 ENCOUNTER — Other Ambulatory Visit: Payer: Self-pay | Admitting: Oncology

## 2016-07-04 DIAGNOSIS — Z1231 Encounter for screening mammogram for malignant neoplasm of breast: Secondary | ICD-10-CM

## 2016-07-18 ENCOUNTER — Ambulatory Visit
Admission: RE | Admit: 2016-07-18 | Discharge: 2016-07-18 | Disposition: A | Discharge status: Home / Self Care | Payer: Medicare Other | Source: Ambulatory Visit | Attending: Oncology | Admitting: Oncology

## 2016-07-18 DIAGNOSIS — Z1231 Encounter for screening mammogram for malignant neoplasm of breast: Secondary | ICD-10-CM | POA: Diagnosis not present

## 2016-07-20 ENCOUNTER — Ambulatory Visit: Payer: Medicare Other | Admitting: Pediatrics

## 2016-07-25 ENCOUNTER — Telehealth: Payer: Self-pay | Admitting: Oncology

## 2016-07-25 NOTE — Telephone Encounter (Signed)
Spoke with patient's husband about appointment change and confirmed

## 2016-07-26 ENCOUNTER — Ambulatory Visit: Payer: Medicare Other | Admitting: Oncology

## 2016-07-26 ENCOUNTER — Other Ambulatory Visit: Payer: Medicare Other

## 2016-08-04 ENCOUNTER — Other Ambulatory Visit: Payer: Self-pay | Admitting: *Deleted

## 2016-08-04 DIAGNOSIS — C50411 Malignant neoplasm of upper-outer quadrant of right female breast: Secondary | ICD-10-CM

## 2016-08-07 ENCOUNTER — Ambulatory Visit (HOSPITAL_BASED_OUTPATIENT_CLINIC_OR_DEPARTMENT_OTHER): Payer: Medicare Other | Admitting: Oncology

## 2016-08-07 ENCOUNTER — Other Ambulatory Visit (HOSPITAL_BASED_OUTPATIENT_CLINIC_OR_DEPARTMENT_OTHER): Payer: Medicare Other

## 2016-08-07 VITALS — BP 144/81 | HR 80 | Temp 98.1°F | Resp 18 | Ht 59.0 in | Wt 130.8 lb

## 2016-08-07 DIAGNOSIS — C50911 Malignant neoplasm of unspecified site of right female breast: Secondary | ICD-10-CM | POA: Diagnosis not present

## 2016-08-07 DIAGNOSIS — R748 Abnormal levels of other serum enzymes: Secondary | ICD-10-CM

## 2016-08-07 DIAGNOSIS — Z170 Estrogen receptor positive status [ER+]: Secondary | ICD-10-CM | POA: Diagnosis not present

## 2016-08-07 DIAGNOSIS — C50411 Malignant neoplasm of upper-outer quadrant of right female breast: Secondary | ICD-10-CM

## 2016-08-07 DIAGNOSIS — M8580 Other specified disorders of bone density and structure, unspecified site: Secondary | ICD-10-CM

## 2016-08-07 LAB — CBC WITH DIFFERENTIAL/PLATELET
BASO%: 1.4 % (ref 0.0–2.0)
BASOS ABS: 0.1 10*3/uL (ref 0.0–0.1)
EOS ABS: 0.1 10*3/uL (ref 0.0–0.5)
EOS%: 1.8 % (ref 0.0–7.0)
HCT: 39.9 % (ref 34.8–46.6)
HEMOGLOBIN: 13.4 g/dL (ref 11.6–15.9)
LYMPH%: 28.7 % (ref 14.0–49.7)
MCH: 33 pg (ref 25.1–34.0)
MCHC: 33.5 g/dL (ref 31.5–36.0)
MCV: 98.2 fL (ref 79.5–101.0)
MONO#: 0.7 10*3/uL (ref 0.1–0.9)
MONO%: 10.1 % (ref 0.0–14.0)
NEUT#: 3.8 10*3/uL (ref 1.5–6.5)
NEUT%: 58 % (ref 38.4–76.8)
PLATELETS: 247 10*3/uL (ref 145–400)
RBC: 4.07 10*6/uL (ref 3.70–5.45)
RDW: 13.3 % (ref 11.2–14.5)
WBC: 6.6 10*3/uL (ref 3.9–10.3)
lymph#: 1.9 10*3/uL (ref 0.9–3.3)

## 2016-08-07 LAB — COMPREHENSIVE METABOLIC PANEL
ALBUMIN: 4.1 g/dL (ref 3.5–5.0)
ALK PHOS: 69 U/L (ref 40–150)
ALT: 91 U/L — ABNORMAL HIGH (ref 0–55)
ANION GAP: 8 meq/L (ref 3–11)
AST: 129 U/L — ABNORMAL HIGH (ref 5–34)
BILIRUBIN TOTAL: 0.51 mg/dL (ref 0.20–1.20)
BUN: 10.9 mg/dL (ref 7.0–26.0)
CO2: 27 mEq/L (ref 22–29)
Calcium: 10 mg/dL (ref 8.4–10.4)
Chloride: 106 mEq/L (ref 98–109)
Creatinine: 0.8 mg/dL (ref 0.6–1.1)
EGFR: 78 mL/min/{1.73_m2} — AB (ref >90)
GLUCOSE: 91 mg/dL (ref 70–140)
POTASSIUM: 4.3 meq/L (ref 3.5–5.1)
SODIUM: 141 meq/L (ref 136–145)
TOTAL PROTEIN: 7.7 g/dL (ref 6.4–8.3)

## 2016-08-07 NOTE — Progress Notes (Signed)
[facility]  Telephone:(336) [phone] Fax:(336) [phone]     ID: Penny Howard OB: 08-12-1945  MR#: [id number]  YTK#:[id number]  PCP: Eustaquio Maize, MD GYN:   SU: Excell Seltzer OTHER MD: Kyung Rudd, Arnoldo Hooker Thimmappa  CHIEF COMPLAINT: Estrogen receptor positive breast cancer  CURRENT TREATMENT: Tamoxifen  BREAST CANCER HISTORY: From the original intake note:  Penny Howard was bending over to get into her hot tub when she noted a mass in her right breast. She intended to keep it to herself, but her husband of 39 years, Penny Howard, noted her changed expression, asked her what it was about, and when she told him they got out of the top and called on her primary care physician. On 06/02/2013 the patient underwent right breast mammography and ultrasonography,  which showed an area of increased density in the superior aspect of the right breast, which was palpable and firm. By palpation it measured approximately 5 cm. Ultrasound located an irregular mass in the right breast at the 12:00 position measuring 4 cm maximally. Ultrasound of the right axilla showed normal axillary contents.  Biopsy of the right breast mass in question 06/02/2013 ([id number]) confirmed an invasive lobular tumor, grade 2, estrogen and progesterone receptors both 100% positive in both with strong staining intensity, with an MIB-1 of 80%. HER-2 is pending.  On 06/03/2013 the patient underwent left breast mammography, which was negative. On 06/09/2013 the patient underwent bilateral breast MRI. This showed, in the left breast, multiple enhancing nodules in the central portion measuring up to 4.6 cm in aggregate. There was evidence of nipple retraction. The largest single  discrete mass measured 2.7 cm. There was no involvement of the pectoralis and no abnormal appearing lymph nodes. The right breast was unremarkable.  The patient's subsequent history is as detailed below  INTERVAL HISTORY: Penny Howard returns today for  follow-up and treatment of her estrogen receptor positive breast cancer. She continues on tamoxifen, with good tolerance. Hot flashes and vaginal wetness are not major issues. She obtains a drug at a good price.   REVIEW OF SYSTEMS: They're doing a lot of traveling to [place] and [place] but they have not gone other big trip [place] for their 50th anniversary. She describes herself is mildly fatigued. Otherwise a detailed review of systems today was stable  PAST MEDICAL HISTORY: Past Medical History:  Diagnosis Date  . Allergy   . Anxiety   . Cancer ([place])    breast rt  . GERD (gastroesophageal reflux disease)   . High cholesterol   . Hypertension   . Osteoporosis   . Stress incontinence   . Wears contact lenses     PAST SURGICAL HISTORY: Past Surgical History:  Procedure Laterality Date  . ABDOMINAL HYSTERECTOMY  1990   ovaries intact b/l  . BLADDER SURGERY  10   mesh  . COLONOSCOPY    . LIPOSUCTION WITH LIPOFILLING Right 04/24/2014   Procedure: LIPOFILLING TO RIGHT CHEST/RIGHT NIPPLE AREOLA COMPLEX CREATION WITH FULL THICKNESS SKIN GRAFT/REVISION OF RIGHT TRANSFLAP;  Surgeon: Irene Limbo, MD;  Location: [facility];  Service: Plastics;  Laterality: Right;  . MASTECTOMY Right 07/04/2013  . MASTECTOMY W/ SENTINEL NODE BIOPSY  07/04/2013   RT TOTAL            DR Moran  . SCAR REVISION Right 04/24/2014   Procedure: SCAR REVISION;  Surgeon: Irene Limbo, MD;  Location: [facility];  Service: Plastics;  Laterality: Right;  . SIMPLE MASTECTOMY WITH  AXILLARY SENTINEL NODE BIOPSY Right 07/04/2013   Procedure: RIGHT TOTAL MASTECTOMY WITH RIGHT  AXILLARY SENTINEL LYMPH NODE BIOPSY;  Surgeon: Edward Jolly, MD;  Location: [facility];  Service: General;  Laterality: Right;  . SKIN FULL THICKNESS GRAFT Right 04/24/2014   Procedure: SKIN GRAFT FULL THICKNESS;  Surgeon: Irene Limbo, MD;  Location: [facility];  Service: Plastics;   Laterality: Right;  . TRAM Right 12/30/2013   Procedure: TRANSVERSE RECTUS ABDOMINIS MYOCUTANEOUS (TRAM) FLAP FOR RIGHT BREAST RECONSTRUCTION;  Surgeon: Irene Limbo, MD;  Location: [facility];  Service: Plastics;  Laterality: Right;    FAMILY HISTORY Family History  Problem Relation Age of Onset  . Breast cancer Mother   . Breast cancer Paternal Aunt   . Lymphoma Cousin        non- hodgkins lymphoma   the patient's father died with congestive heart failure at the age of 40. The patient's mother died from "multiple causes" at the age of 31. Penny Howard had one brother, no sisters. The patient's mother was diagnosed with breast cancer the age of 44 there is also a paternal aunt diagnosed with breast cancer at the age of 50.  GYNECOLOGIC HISTORY:  Menarche age 36, first live birth age 76, the patient is [place] P2. She stopped having periods approximately 1990. She did not take hormone replacement. 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 Their name is pronounced "Nam-zura"--it is of [place] origin. Daughter Penny Howard lives in [place] for she is a Nurse, children's and daughter Penny Howard lives in [place] where she is a housewife. The patient has 1 grandchild. She attends a [employer].    ADVANCED DIRECTIVES: Not in place   HEALTH MAINTENANCE: Social History  Substance Use Topics  . Smoking status: Never Smoker  . Smokeless tobacco: Never Used  . Alcohol use 3.0 oz/week    5 Glasses of wine per week     Comment: occaionally     Colonoscopy: 2001?/Dr. Collene Mares  PAP:  Bone density: 2014/osteopenia  Lipid panel:  Allergies  Allergen Reactions  . Codeine Nausea Only    Current Outpatient Prescriptions  Medication Sig Dispense Refill  . escitalopram (LEXAPRO) 10 MG tablet Take 1 tablet (10 mg  total) by mouth daily. 90 tablet 3  . esomeprazole (NEXIUM) 20 MG capsule Take 20 mg by mouth daily at 12 noon.    Marland Kitchen lisinopril (PRINIVIL,ZESTRIL) 10 MG tablet Take 1 tablet (10 mg total) by mouth daily. 90 tablet 3  . multivitamin-lutein (OCUVITE-LUTEIN) CAPS capsule Take 1 capsule by mouth daily. 250 mg capsule    . Red Yeast Rice 600 MG CAPS Take 600 mg by mouth daily.    . risedronate (ACTONEL) 150 MG tablet Take 1 tablet (150 mg total) by mouth every 30 (thirty) days. with water on empty stomach, nothing by mouth or lie down for next 30 minutes. 3 tablet 3  . tamoxifen (NOLVADEX) 20 MG tablet Take 1 tablet (20 mg total) by mouth daily. 90 tablet 3   No current facility-administered medications for this visit.     OBJECTIVE: Middle-aged white woman In no acute distress  Vitals:   08/07/16 1318  BP: (!) 144/81  Pulse: 80  Resp: 18  Temp: 98.1 F (36.7 C)     Body mass index is 26.42 kg/m.  ECOG FS:0 - Asymptomatic  Sclerae unicteric, EOMs intact Oropharynx clear and moist No cervical or supraclavicular adenopathy Lungs no rales or rhonchi Heart regular rate and rhythm Abd soft, nontender, positive bowel sounds MSK no focal spinal tenderness, no upper extremity lymphedema Neuro: nonfocal, well oriented, appropriate affect Breasts: She has undergone right mastectomy with TRAM reconstruction and there is no evidence of local recurrence. The slightly firmer area in the superior breast is unchanged. There is no erythema or skin change and the right axilla is benign. The left breast is unremarkable.     LAB RESULTS:  CMP     Component Value Date/Time   NA 141 08/07/2016 1231   K 4.3 08/07/2016 1231   CL 102 01/04/2015 1125   CO2 27 08/07/2016 1231   GLUCOSE 91 08/07/2016 1231   BUN 10.9 08/07/2016 1231   CREATININE 0.8 08/07/2016 1231   CALCIUM 10.0 08/07/2016 1231   PROT 7.7 08/07/2016 1231   ALBUMIN 4.1 08/07/2016 1231   AST 129 (H) 08/07/2016 1231   ALT 91 (H)  08/07/2016 1231   ALKPHOS 69 08/07/2016 1231   BILITOT 0.51 08/07/2016 1231   GFRNONAA 88 01/04/2015 1125   GFRAA 102 01/04/2015 1125    I No results found for: SPEP  Lab Results  Component Value Date   WBC 6.6 08/07/2016   NEUTROABS 3.8 08/07/2016   HGB 13.4 08/07/2016   HCT 39.9 08/07/2016   MCV 98.2 08/07/2016   PLT 247 08/07/2016      Chemistry      Component Value Date/Time   NA 141 08/07/2016 1231   K 4.3 08/07/2016 1231   CL 102 01/04/2015 1125   CO2 27 08/07/2016 1231   BUN 10.9 08/07/2016 1231   CREATININE 0.8 08/07/2016 1231      Component Value Date/Time   CALCIUM 10.0 08/07/2016 1231   ALKPHOS 69 08/07/2016 1231   AST 129 (H) 08/07/2016 1231   ALT 91 (H) 08/07/2016 1231   BILITOT 0.51 08/07/2016 1231       No results found for: LABCA2  No components found for: LABCA125  No results for input(s): INR in the last 168 hours.  Urinalysis No results found for: COLORURINE  STUDIES: Left mammography with tomography at the [facility] 07/18/2016 shows the breast density to be category B. There was no evidence of malignancy.   ASSESSMENT: 71 y.o. [place] woman status post right breast biopsy 06/02/2013 for a clinical T2 N0, stage IIA invasive lobular breast cancer, grade 2, estrogen receptor and progesterone receptor positive, with an MIB-1 of 80%, HER-2 obtained from definitive surgery  (1) status post right mastectomy and sentinel lymph node sampling 07/04/2013 for a pT3, pN0, stage IIB invasive lobular carcinoma, grade 2, HER-2 negative  (2) Oncotype score of 6 predicts a risk of outside the breast recurrence within 10 years and 5% of the patient's only systemic treatment is tamoxifen for 5 years. It also predicts no benefit from chemotherapy  (3) tamoxifen started 09/15/2013  (4) underwent TRAM flap reconstruction 12/30/2013  PLAN: Aithana Is now 3 years out from definitive surgery for her breast cancer with no evidence of disease recurrence.  This is very favorable.  She continues on tamoxifen with excellent tolerance. The plan will be to continue that for a total of 5 years.  Ill-defined the ointment is the rise in her transaminases. At this point this only needs to be repeated and I have requested that a hepatic function panel be drawn when she sees  her primary care physician on July 11 of this year and that a copy of the results be faxed year. If they remain elevated I will proceed to a liver ultrasound.  Otherwise Renlee will return to see me in one year. She knows to call for any problems that may develop before the next visit here. Chauncey Cruel, MD   08/07/2016 1:37 PM

## 2016-08-08 ENCOUNTER — Other Ambulatory Visit: Payer: Self-pay | Admitting: Oncology

## 2016-08-30 ENCOUNTER — Encounter: Payer: Self-pay | Admitting: Pediatrics

## 2016-08-30 ENCOUNTER — Encounter: Payer: Self-pay | Admitting: *Deleted

## 2016-08-30 ENCOUNTER — Ambulatory Visit (INDEPENDENT_AMBULATORY_CARE_PROVIDER_SITE_OTHER): Payer: Medicare Other | Admitting: Pediatrics

## 2016-08-30 VITALS — BP 124/73 | HR 76 | Temp 98.2°F | Ht 59.0 in | Wt 132.2 lb

## 2016-08-30 DIAGNOSIS — F411 Generalized anxiety disorder: Secondary | ICD-10-CM

## 2016-08-30 DIAGNOSIS — I10 Essential (primary) hypertension: Secondary | ICD-10-CM

## 2016-08-30 DIAGNOSIS — R0989 Other specified symptoms and signs involving the circulatory and respiratory systems: Secondary | ICD-10-CM | POA: Diagnosis not present

## 2016-08-30 DIAGNOSIS — R7989 Other specified abnormal findings of blood chemistry: Secondary | ICD-10-CM

## 2016-08-30 DIAGNOSIS — M810 Age-related osteoporosis without current pathological fracture: Secondary | ICD-10-CM

## 2016-08-30 DIAGNOSIS — E785 Hyperlipidemia, unspecified: Secondary | ICD-10-CM

## 2016-08-30 DIAGNOSIS — R945 Abnormal results of liver function studies: Secondary | ICD-10-CM

## 2016-08-30 DIAGNOSIS — Z0000 Encounter for general adult medical examination without abnormal findings: Secondary | ICD-10-CM | POA: Diagnosis not present

## 2016-08-30 DIAGNOSIS — K219 Gastro-esophageal reflux disease without esophagitis: Secondary | ICD-10-CM | POA: Diagnosis not present

## 2016-08-30 LAB — CMP14+EGFR
ALT: 99 IU/L — AB (ref 0–32)
AST: 114 IU/L — ABNORMAL HIGH (ref 0–40)
Albumin/Globulin Ratio: 1.6 (ref 1.2–2.2)
Albumin: 4.4 g/dL (ref 3.6–4.8)
Alkaline Phosphatase: 65 IU/L (ref 39–117)
BILIRUBIN TOTAL: 0.3 mg/dL (ref 0.0–1.2)
BUN/Creatinine Ratio: 17 (ref 12–28)
BUN: 12 mg/dL (ref 8–27)
CHLORIDE: 103 mmol/L (ref 96–106)
CO2: 20 mmol/L (ref 20–29)
Calcium: 9.8 mg/dL (ref 8.7–10.3)
Creatinine, Ser: 0.71 mg/dL (ref 0.57–1.00)
GFR calc non Af Amer: 88 mL/min/{1.73_m2} (ref >59)
GFR, EST AFRICAN AMERICAN: 101 mL/min/{1.73_m2} (ref >59)
GLUCOSE: 96 mg/dL (ref 65–99)
Globulin, Total: 2.8 g/dL (ref 1.5–4.5)
Potassium: 4.5 mmol/L (ref 3.5–5.2)
Sodium: 140 mmol/L (ref 134–144)
TOTAL PROTEIN: 7.2 g/dL (ref 6.0–8.5)

## 2016-08-30 LAB — LIPID PANEL
CHOLESTEROL TOTAL: 177 mg/dL (ref 100–199)
Chol/HDL Ratio: 3.5 ratio (ref 0.0–4.4)
HDL: 51 mg/dL (ref >39)
LDL Calculated: 110 mg/dL — ABNORMAL HIGH (ref 0–99)
TRIGLYCERIDES: 82 mg/dL (ref 0–149)
VLDL Cholesterol Cal: 16 mg/dL (ref 5–40)

## 2016-08-30 MED ORDER — ESCITALOPRAM OXALATE 10 MG PO TABS: 10.0000 mg | ORAL_TABLET | Freq: Every day | ORAL | 3 refills | Status: DC

## 2016-08-30 MED ORDER — RISEDRONATE SODIUM 150 MG PO TABS: 150.0000 mg | ORAL_TABLET | ORAL | 3 refills | Status: DC

## 2016-08-30 MED ORDER — ESOMEPRAZOLE MAGNESIUM 20 MG PO CPDR: 20.0000 mg | DELAYED_RELEASE_CAPSULE | Freq: Every day | ORAL | 1 refills | Status: DC

## 2016-08-30 NOTE — Progress Notes (Signed)
Subjective:   Patient ID: Penny Howard, female    DOB: 09/30/1945, 71 y.o.   MRN: [id number] CC: Annual Exam and Elevated Liver Function  HPI: Penny Howard is a 71 y.o. female presenting for Annual Exam and Elevated Liver Function  GERD: takes nexium every day now Was taking qod but had a recent flare of reflux  Leg cramps: just L leg, sometimes at night Stretching/walking helps  HTN: 120-130/70s at home Taking BPs at home  Hears heart beat when she lays on R side  Dull ache R lower side at times Comes and goes Usually not there Not there today Sometimes has diarrhea for a few days, then skips a few days, then will have normal stools again hasnt noticed if pain associated with being more constipated Has had multiple abd surgeries  Sometimes has slight tremor in hand, worse in morning Mother and grandfather both also with tremor  Walking every day No SOB, CP No limitation to activities  Colonoscopy in 2017, Dr Collene Mares  HLD: made her more forgetful when on statin, now on red yeast rice, has declined statin in the past  Follows with oncology for h/o breast cancer Last visit had elevated LFTs, requested recheck today Not on statin Was taking tylenol PM nightly, not taking other tyenol No EtOH use  Follows with dermatology, has had small skin cancers removed  Anxiety: lexapro 53m has improved symptoms Relevant past medical, surgical, family and social history reviewed. Allergies and medications reviewed and updated. History  Smoking Status  . Never Smoker  Smokeless Tobacco  . Never Used   ROS: All systems neg other than what is in HPI  Objective:    BP 124/73   Pulse 76   Temp 98.2 F (36.8 C) (Oral)   Ht 4' 11"  (1.499 m)   Wt 132 lb 3.2 oz (60 kg)   BMI 26.70 kg/m   Wt Readings from Last 3 Encounters:  08/30/16 132 lb 3.2 oz (60 kg)  08/07/16 130 lb 12.8 oz (59.3 kg)  09/09/15 135 lb 3.2 oz (61.3 kg)    Gen: NAD, alert, cooperative with  exam, NCAT EYES: EOMI, no conjunctival injection, or no icterus ENT:  TMs pearly gray b/l, OP without erythema LYMPH: no cervical LAD Neck: Soft bruit over R carotid CV: NRRR, normal S1/S2, no murmur, distal pulses 2+ b/l Resp: CTABL, no wheezes, normal WOB Abd: +BS, soft, NTND. no guarding or organomegaly. Some hard scar tissue present along low transverse scar Ext: No edema, warm Neuro: Alert and oriented, strength equal b/l UE and LE, coordination grossly normal, normal sensation MSK: normal muscle bulk   Assessment & Plan:  GCaressewas seen today for annual exam and elevated liver function.  Diagnoses and all orders for this visit:  Encounter for preventive health examination  Anxiety state Stable, cont below -     escitalopram (LEXAPRO) 10 MG tablet; Take 1 tablet (10 mg total) by mouth daily.  Osteoporosis, unspecified osteoporosis type, unspecified pathological fracture presence Cont bisphosphonate -     risedronate (ACTONEL) 150 MG tablet; Take 1 tablet (150 mg total) by mouth every 30 (thirty) days. with water on empty stomach, nothing by mouth or lie down for next 30 minutes.  Hyperlipidemia, unspecified hyperlipidemia type Repeat lipid panel -     Lipid Panel  Gastroesophageal reflux disease, esophagitis presence not specified Takes as needed, recently has been every day -     esomeprazole (NEXIUM) 20 MG capsule; Take 1 capsule (20  mg total) by mouth daily at 12 noon.  Elevated LFTs Repeat LFTs -     CMP14+EGFR  Bruit of right carotid artery Checking lipid panel, get dopplers -     US Carotid Duplex Bilateral; Future   Follow up plan: Return in about 6 months (around 03/02/2017) for med follow up. Assunta Found, MD [place]

## 2016-09-01 ENCOUNTER — Telehealth: Payer: Self-pay

## 2016-09-01 ENCOUNTER — Other Ambulatory Visit: Payer: Self-pay | Admitting: Pediatrics

## 2016-09-01 DIAGNOSIS — R7401 Elevation of levels of liver transaminase levels: Secondary | ICD-10-CM

## 2016-09-01 DIAGNOSIS — R740 Nonspecific elevation of levels of transaminase and lactic acid dehydrogenase [LDH]: Principal | ICD-10-CM

## 2016-09-01 NOTE — Telephone Encounter (Signed)
Pt is aware of appointment date/time

## 2016-09-05 ENCOUNTER — Ambulatory Visit (HOSPITAL_COMMUNITY)
Admission: RE | Admit: 2016-09-05 | Discharge: 2016-09-05 | Disposition: A | Discharge status: Home / Self Care | Payer: Medicare Other | Source: Ambulatory Visit | Attending: Pediatrics | Admitting: Pediatrics

## 2016-09-05 DIAGNOSIS — R0989 Other specified symptoms and signs involving the circulatory and respiratory systems: Secondary | ICD-10-CM | POA: Diagnosis not present

## 2016-09-05 DIAGNOSIS — R7401 Elevation of levels of liver transaminase levels: Secondary | ICD-10-CM

## 2016-09-05 DIAGNOSIS — I6523 Occlusion and stenosis of bilateral carotid arteries: Secondary | ICD-10-CM | POA: Diagnosis not present

## 2016-09-05 DIAGNOSIS — K760 Fatty (change of) liver, not elsewhere classified: Secondary | ICD-10-CM | POA: Insufficient documentation

## 2016-09-05 DIAGNOSIS — K8020 Calculus of gallbladder without cholecystitis without obstruction: Secondary | ICD-10-CM | POA: Diagnosis not present

## 2016-09-05 DIAGNOSIS — R740 Nonspecific elevation of levels of transaminase and lactic acid dehydrogenase [LDH]: Secondary | ICD-10-CM

## 2016-09-08 ENCOUNTER — Telehealth: Payer: Self-pay | Admitting: Pediatrics

## 2016-09-08 DIAGNOSIS — R740 Nonspecific elevation of levels of transaminase and lactic acid dehydrogenase [LDH]: Secondary | ICD-10-CM

## 2016-09-08 DIAGNOSIS — I6521 Occlusion and stenosis of right carotid artery: Secondary | ICD-10-CM

## 2016-09-08 DIAGNOSIS — E785 Hyperlipidemia, unspecified: Secondary | ICD-10-CM

## 2016-09-08 DIAGNOSIS — R7401 Elevation of levels of liver transaminase levels: Secondary | ICD-10-CM

## 2016-09-08 NOTE — Telephone Encounter (Signed)
Patient would like [place] results. Wishes to wait until you are back in the office

## 2016-09-11 DIAGNOSIS — I6521 Occlusion and stenosis of right carotid artery: Secondary | ICD-10-CM | POA: Insufficient documentation

## 2016-09-11 MED ORDER — PRAVASTATIN SODIUM 20 MG PO TABS: 20.0000 mg | ORAL_TABLET | Freq: Every day | ORAL | 3 refills | Status: DC

## 2016-09-11 NOTE — Telephone Encounter (Signed)
D/w pt results and rec.  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 Will start 71m ASA, pravastatin 238mat night. Let me know if s/e from statin. Repeat u/s in 1 yr.  Abd u/s with hep steatosis, possible cirrhosis, will refer to GI.

## 2016-09-13 ENCOUNTER — Telehealth: Payer: Self-pay | Admitting: Gastroenterology

## 2016-09-13 ENCOUNTER — Other Ambulatory Visit: Payer: Self-pay | Admitting: Pediatrics

## 2016-09-13 DIAGNOSIS — I10 Essential (primary) hypertension: Secondary | ICD-10-CM

## 2016-09-13 NOTE — Telephone Encounter (Signed)
Received referral for patient to be seen for Transaminitis. Patient has gi hx with Dr.Mann in the past year. Patient wants all her offices to be with [facility], so she would like to come here and see a female doctor. Previous gi records placed on Dr.Nandigam's desk for review.

## 2016-09-14 DIAGNOSIS — H04123 Dry eye syndrome of bilateral lacrimal glands: Secondary | ICD-10-CM | POA: Diagnosis not present

## 2016-09-28 ENCOUNTER — Encounter: Payer: Self-pay | Admitting: Gastroenterology

## 2016-09-28 NOTE — Telephone Encounter (Signed)
Dr. Silverio Decamp reviewed records and has accepted patient. Ok to schedule OV.

## 2016-10-08 ENCOUNTER — Other Ambulatory Visit: Payer: Self-pay | Admitting: Oncology

## 2016-10-08 NOTE — Progress Notes (Unsigned)
Penny Howard was found to have elevated transaminases. To seeing her on 09/05/2016 showed hepatic steatosis and possible early cirrhosis. She has an appointment with GI in October.

## 2016-11-22 ENCOUNTER — Ambulatory Visit (INDEPENDENT_AMBULATORY_CARE_PROVIDER_SITE_OTHER): Payer: Medicare Other | Admitting: Gastroenterology

## 2016-11-22 ENCOUNTER — Other Ambulatory Visit (INDEPENDENT_AMBULATORY_CARE_PROVIDER_SITE_OTHER): Payer: Medicare Other

## 2016-11-22 ENCOUNTER — Encounter: Payer: Self-pay | Admitting: Gastroenterology

## 2016-11-22 VITALS — BP 138/84 | HR 72 | Ht 59.0 in | Wt 131.2 lb

## 2016-11-22 DIAGNOSIS — R932 Abnormal findings on diagnostic imaging of liver and biliary tract: Secondary | ICD-10-CM

## 2016-11-22 DIAGNOSIS — R945 Abnormal results of liver function studies: Secondary | ICD-10-CM | POA: Diagnosis not present

## 2016-11-22 DIAGNOSIS — K760 Fatty (change of) liver, not elsewhere classified: Secondary | ICD-10-CM

## 2016-11-22 DIAGNOSIS — I6521 Occlusion and stenosis of right carotid artery: Secondary | ICD-10-CM | POA: Diagnosis not present

## 2016-11-22 DIAGNOSIS — R7989 Other specified abnormal findings of blood chemistry: Secondary | ICD-10-CM

## 2016-11-22 LAB — CBC WITH DIFFERENTIAL/PLATELET
Basophils Absolute: 0.1 10*3/uL (ref 0.0–0.1)
Basophils Relative: 1.3 % (ref 0.0–3.0)
EOS ABS: 0.1 10*3/uL (ref 0.0–0.7)
Eosinophils Relative: 1.8 % (ref 0.0–5.0)
HEMATOCRIT: 42 % (ref 36.0–46.0)
HEMOGLOBIN: 14 g/dL (ref 12.0–15.0)
LYMPHS PCT: 27.9 % (ref 12.0–46.0)
Lymphs Abs: 1.9 10*3/uL (ref 0.7–4.0)
MCHC: 33.2 g/dL (ref 30.0–36.0)
MCV: 99.6 fl (ref 78.0–100.0)
MONOS PCT: 9.8 % (ref 3.0–12.0)
Monocytes Absolute: 0.7 10*3/uL (ref 0.1–1.0)
Neutro Abs: 4.1 10*3/uL (ref 1.4–7.7)
Neutrophils Relative %: 59.2 % (ref 43.0–77.0)
Platelets: 236 10*3/uL (ref 150.0–400.0)
RBC: 4.22 Mil/uL (ref 3.87–5.11)
RDW: 12.4 % (ref 11.5–15.5)
WBC: 6.9 10*3/uL (ref 4.0–10.5)

## 2016-11-22 LAB — COMPREHENSIVE METABOLIC PANEL
ALBUMIN: 4.5 g/dL (ref 3.5–5.2)
ALK PHOS: 49 U/L (ref 39–117)
ALT: 53 U/L — ABNORMAL HIGH (ref 0–35)
AST: 77 U/L — AB (ref 0–37)
BILIRUBIN TOTAL: 0.6 mg/dL (ref 0.2–1.2)
BUN: 16 mg/dL (ref 6–23)
CALCIUM: 10.1 mg/dL (ref 8.4–10.5)
CHLORIDE: 103 meq/L (ref 96–112)
CO2: 26 mEq/L (ref 19–32)
CREATININE: 0.73 mg/dL (ref 0.40–1.20)
GFR: 83.97 mL/min (ref >60.00)
Glucose, Bld: 106 mg/dL — ABNORMAL HIGH (ref 70–99)
Potassium: 5 mEq/L (ref 3.5–5.1)
SODIUM: 137 meq/L (ref 135–145)
TOTAL PROTEIN: 8 g/dL (ref 6.0–8.3)

## 2016-11-22 LAB — PROTIME-INR
INR: 1.3 ratio — ABNORMAL HIGH (ref 0.8–1.0)
Prothrombin Time: 14 s — ABNORMAL HIGH (ref 9.6–13.1)

## 2016-11-22 LAB — IGA: IGA: 340 mg/dL (ref 68–378)

## 2016-11-22 LAB — SEDIMENTATION RATE: SED RATE: 12 mm/h (ref 0–30)

## 2016-11-22 NOTE — Progress Notes (Signed)
Penny Howard    [id number]    [birth year]  Primary Care Physician:Penny Howard, Penny Hun, MD  Referring Physician: Eustaquio Maize, MD [place], [place] [address]  Chief complaint:  Abnormal LFT  HPI:  71 year old female with history of breast cancer stage II a, carotid stenosis, hyperlipidemia here for evaluation of abnormal LFT. Patient was noted to have elevated transaminases AST 129 and ALT 91 from baseline 30-40's on 08/07/2016, repeat check on 08/30/2016 showed persistent elevation of AST 114 and ALT 99. Total bili and alkaline phosphatase within normal range. Patient denies any abdominal pain, nausea, fever or chills, joint pains or skin rash. No travel or sick contacts. No recent use of antibiotics, herbal remedies or any new medication.  Last colonoscopy 10/27/2015 showed sigmoid diverticulosis and small internal hemorrhoids Abdominal ultrasound showed hepatic steatosis and cholelithiasis, ? Subtle nodularity concerning for hepatic cirrhosis   Outpatient Encounter Prescriptions as of 11/22/2016  Medication Sig  . Biotin 1 MG CAPS Take by mouth.  . escitalopram (LEXAPRO) 10 MG tablet Take 1 tablet (10 mg total) by mouth daily.  Marland Kitchen esomeprazole (NEXIUM) 20 MG capsule Take 1 capsule (20 mg total) by mouth daily at 12 noon.  Marland Kitchen lisinopril (PRINIVIL,ZESTRIL) 10 MG tablet TAKE ONE TABLET BY MOUTH ONCE DAILY  . multivitamin-lutein (OCUVITE-LUTEIN) CAPS capsule Take 1 capsule by mouth daily. 250 mg capsule  . pravastatin (PRAVACHOL) 20 MG tablet Take 1 tablet (20 mg total) by mouth daily.  . Red Yeast Rice 600 MG CAPS Take 600 mg by mouth daily.  . risedronate (ACTONEL) 150 MG tablet Take 1 tablet (150 mg total) by mouth every 30 (thirty) days. with water on empty stomach, nothing by mouth or lie down for next 30 minutes.  . tamoxifen (NOLVADEX) 20 MG tablet TAKE 1 TABLET DAILY  . vitamin E (VITAMIN E) 400 UNIT capsule Take 400 Units by mouth daily.  .  [DISCONTINUED] diphenhydramine-acetaminophen (TYLENOL PM) 25-500 MG TABS tablet Take 1 tablet by mouth at bedtime as needed.   No facility-administered encounter medications on file as of 11/22/2016.     Allergies as of 11/22/2016 - Review Complete 08/30/2016  Allergen Reaction Noted  . Codeine Nausea Only 06/20/2013    Past Medical History:  Diagnosis Date  . Allergy   . Anxiety   . Cancer ([place])    breast rt  . GERD (gastroesophageal reflux disease)   . High cholesterol   . Hypertension   . Osteoporosis   . Stress incontinence   . Wears contact lenses     Past Surgical History:  Procedure Laterality Date  . ABDOMINAL HYSTERECTOMY  1990   ovaries intact b/l  . BLADDER SURGERY  10   mesh  . COLONOSCOPY    . LIPOSUCTION WITH LIPOFILLING Right 04/24/2014   Procedure: LIPOFILLING TO RIGHT CHEST/RIGHT NIPPLE AREOLA COMPLEX CREATION WITH FULL THICKNESS SKIN GRAFT/REVISION OF RIGHT TRANSFLAP;  Surgeon: Penny Limbo, MD;  Location: [facility];  Service: Plastics;  Laterality: Right;  . MASTECTOMY Right 07/04/2013  . MASTECTOMY W/ SENTINEL NODE BIOPSY  07/04/2013   RT TOTAL            [facility] [place]  . SCAR REVISION Right 04/24/2014   Procedure: SCAR REVISION;  Surgeon: Penny Limbo, MD;  Location: [facility];  Service: Plastics;  Laterality: Right;  . SIMPLE MASTECTOMY WITH AXILLARY SENTINEL NODE BIOPSY Right 07/04/2013   Procedure: RIGHT TOTAL MASTECTOMY  WITH RIGHT  AXILLARY SENTINEL LYMPH NODE BIOPSY;  Surgeon: Penny Jolly, MD;  [facility];  Service: General;  Laterality: Right;  . SKIN FULL THICKNESS GRAFT Right 04/24/2014   Procedure: SKIN GRAFT FULL THICKNESS;  Surgeon: Penny Limbo, MD;  Location: [facility];  Service: Plastics;  Laterality: Right;  . TRAM Right 12/30/2013   Procedure: TRANSVERSE RECTUS ABDOMINIS MYOCUTANEOUS (TRAM) FLAP FOR RIGHT BREAST RECONSTRUCTION;  Surgeon: Penny Limbo, MD;  Location: [facility];   Service: Plastics;  Laterality: Right;    Family History  Problem Relation Age of Onset  . Breast cancer Mother   . Heart failure Father   . Breast cancer Paternal Aunt   . Lymphoma Cousin        non- hodgkins lymphoma  . Colon cancer Neg Hx   . Rectal cancer Neg Hx   . Esophageal cancer Neg Hx   . Stomach cancer Neg Hx   . Liver cancer Neg Hx     Social History   Social History  . Marital status: Married    Spouse name: N/A  . Number of children: 2  . Years of education: N/A   Occupational History  . retired    Social History Main Topics  . Smoking status: Never Smoker  . Smokeless tobacco: Never Used  . Alcohol use 3.0 oz/week    5 Glasses of wine per week     Comment: occaionally  . Drug use: No  . Sexual activity: Yes   Other Topics Concern  . Not on file   Social History Narrative  . No narrative on file      Review of systems: Review of Systems  Constitutional: Negative for fever and chills.  HENT: Negative.   Eyes: Negative for blurred vision.  Respiratory: Negative for cough, shortness of breath and wheezing.   Cardiovascular: Negative for chest pain and palpitations.  Gastrointestinal: as per HPI Genitourinary: Negative for dysuria, urgency, frequency and hematuria.  Musculoskeletal: Negative for myalgias, back pain and joint pain.  Skin: Negative for itching and rash.  Neurological: Negative for dizziness, tremors, focal weakness, seizures and loss of consciousness.  Endo/Heme/Allergies: Positive for seasonal allergies.  Psychiatric/Behavioral: Negative for depression, suicidal ideas and hallucinations.  All other systems reviewed and are negative.   Physical Exam: Vitals:   11/22/16 0917  BP: 138/84  Pulse: 72   Body mass index is 26.51 kg/m. Gen:      No acute distress HEENT:  EOMI, sclera anicteric Neck:     No masses; no thyromegaly Lungs:    Clear to auscultation bilaterally; normal respiratory effort CV:         Regular rate  and rhythm; no murmurs Abd:      + bowel sounds; soft, non-tender; no palpable masses, no distension Ext:    No edema; adequate peripheral perfusion Skin:      Warm and dry; no rash Neuro: alert and oriented x 3 Psych: normal mood and affect  Data Reviewed:  Reviewed labs, radiology imaging, old records and pertinent past GI work up  Abdominal ultrasound limited right upper quadrant 09/05/16  1. Hepatic steatosis. 2. Subtle nodularity of the liver surface and blunting of the tip of the left lobe of the liver raise concern for hepatic cirrhosis. 3. Cholelithiasis.  Assessment and Plan/Recommendations:  71 year old female with hyperlipidemia, carotid artery stenosis, GERD, breast cancer here for evaluation of elevated transaminases Check CBC, CMP, PT INR, hep A, hep B, hep C, ANA, AMA,  alpha-1 antitrypsin, ceruloplasmin, anti-smooth muscle antibody, EBV antibody and CMV antibody to exclude infectious etiology or chronic underlying liver disease Abdominal ultrasound raised a  concern for possible cirrhosis We will schedule fiberoscan of liver to evaluate degree of fibrosis Continue pravastatin Advised patient to avoid nsaids or any herbal remedies Avoid alcohol   K. Denzil Magnuson , MD [phone] Mon-Fri 8a-5p [phone] after 5p, weekends, holidays  CC: Penny Maize, MD

## 2016-11-22 NOTE — Patient Instructions (Addendum)
Go to the basement for labs today   We will contact you with that Fibroscan appointment

## 2016-11-23 LAB — ANTI-NUCLEAR AB-TITER (ANA TITER)

## 2016-11-23 LAB — ANA: Anti Nuclear Antibody(ANA): POSITIVE — AB

## 2016-11-26 LAB — ALPHA-1-ANTITRYPSIN: A-1 Antitrypsin, Ser: 231 mg/dL — ABNORMAL HIGH (ref 83–199)

## 2016-11-26 LAB — TISSUE TRANSGLUTAMINASE ABS,IGG,IGA
(tTG) Ab, IgA: 1 U/mL
(tTG) Ab, IgG: 1 U/mL

## 2016-11-26 LAB — MITOCHONDRIAL ANTIBODIES: Mitochondrial M2 Ab, IgG: <=20 U

## 2016-11-26 LAB — CYTOMEGALOVIRUS ANTIBODY, IGG: Cytomegalovirus Ab-IgG: >10 U/mL — ABNORMAL HIGH

## 2016-11-26 LAB — AFP TUMOR MARKER: AFP TUMOR MARKER: 5.5 ng/mL

## 2016-11-26 LAB — HEPATITIS B SURFACE ANTIGEN: Hepatitis B Surface Ag: NONREACTIVE

## 2016-11-26 LAB — HEPATITIS A ANTIBODY, TOTAL: HEPATITIS A AB,TOTAL: REACTIVE — AB

## 2016-11-26 LAB — ANTI-SMOOTH MUSCLE ANTIBODY, IGG: Actin (Smooth Muscle) Antibody (IGG): <20 U (ref <20)

## 2016-11-26 LAB — HEPATITIS B SURFACE ANTIBODY,QUALITATIVE: HEP B S AB: NONREACTIVE

## 2016-11-26 LAB — HEPATITIS C ANTIBODY
Hepatitis C Ab: NONREACTIVE
SIGNAL TO CUT-OFF: 0.01 (ref <1.00)

## 2016-11-26 LAB — EPSTEIN-BARR VIRUS NUCLEAR ANTIGEN ANTIBODY, IGG: EBV NA IGG: 579 U/mL — AB

## 2016-11-26 LAB — CERULOPLASMIN: CERULOPLASMIN: 42 mg/dL (ref 18–53)

## 2016-11-29 ENCOUNTER — Telehealth: Payer: Self-pay | Admitting: *Deleted

## 2016-11-29 DIAGNOSIS — R10817 Generalized abdominal tenderness: Secondary | ICD-10-CM

## 2016-11-29 DIAGNOSIS — R932 Abnormal findings on diagnostic imaging of liver and biliary tract: Secondary | ICD-10-CM

## 2016-11-29 DIAGNOSIS — K760 Fatty (change of) liver, not elsewhere classified: Secondary | ICD-10-CM

## 2016-11-29 DIAGNOSIS — R1011 Right upper quadrant pain: Secondary | ICD-10-CM

## 2016-11-29 DIAGNOSIS — R7989 Other specified abnormal findings of blood chemistry: Secondary | ICD-10-CM

## 2016-11-29 DIAGNOSIS — R945 Abnormal results of liver function studies: Secondary | ICD-10-CM

## 2016-11-29 NOTE — Telephone Encounter (Signed)
Dr Silverio Decamp Iv used about 8 dx codes to try and get this scheduled and it wont go through  For me to schedule it

## 2016-11-29 NOTE — Telephone Encounter (Signed)
I think we can schedule it in epic orders US abdomen RUQ with elastography

## 2016-11-29 NOTE — Telephone Encounter (Signed)
To: Oda Kilts, CMA Subject: RE: [facility] it, please request [place] elastography limited RUQ with images. I forwarded the email I received regarding fibroscan.  Also please check if [facility] can do the Fibroscan here in [facility] Thanks VN  ----- Message ----- From: Oda Kilts, CMA Sent: 11/27/2016   2:52 PM To: Mauri Pole, MD Subject: Fibroscan                                      Just reminding you to check on this for me, I still have not scheduled this for the patient yet    Dr Jenetta Downer trying to order this test, The codes iv used will not cover the test for Medicare  Is there any other codes for me to use??

## 2016-11-30 NOTE — Telephone Encounter (Signed)
Penny Howard,  Can you please check if [facility]) does fibroscan or liver elastography, if yes please fax the request to them. Thanks

## 2016-12-01 NOTE — Telephone Encounter (Signed)
You have been scheduled for an abdominal ultrasound/Elastrography at [facility] (1st floor of hospital) on 12/06/2016 at 11:30am. Please arrive 15 minutes prior to your appointment for registration. Make certain not to have anything to eat or drink 8  hours prior to your appointment. Should you need to reschedule your appointment, please contact radiology at [phone]. This test typically takes about 30 minutes to perform.  Left message for patient date and time of ultrasound and to contact the office

## 2016-12-06 ENCOUNTER — Ambulatory Visit (HOSPITAL_COMMUNITY)
Admission: RE | Admit: 2016-12-06 | Discharge: 2016-12-06 | Disposition: A | Discharge status: Home / Self Care | Payer: Medicare Other | Source: Ambulatory Visit | Attending: Gastroenterology | Admitting: Gastroenterology

## 2016-12-06 DIAGNOSIS — R1011 Right upper quadrant pain: Secondary | ICD-10-CM | POA: Diagnosis not present

## 2016-12-06 DIAGNOSIS — R945 Abnormal results of liver function studies: Secondary | ICD-10-CM | POA: Diagnosis not present

## 2016-12-06 DIAGNOSIS — K760 Fatty (change of) liver, not elsewhere classified: Secondary | ICD-10-CM | POA: Insufficient documentation

## 2016-12-06 DIAGNOSIS — K8020 Calculus of gallbladder without cholecystitis without obstruction: Secondary | ICD-10-CM | POA: Insufficient documentation

## 2016-12-06 DIAGNOSIS — R7989 Other specified abnormal findings of blood chemistry: Secondary | ICD-10-CM

## 2016-12-06 DIAGNOSIS — R932 Abnormal findings on diagnostic imaging of liver and biliary tract: Secondary | ICD-10-CM | POA: Diagnosis not present

## 2016-12-06 DIAGNOSIS — R10817 Generalized abdominal tenderness: Secondary | ICD-10-CM | POA: Diagnosis not present

## 2017-02-05 ENCOUNTER — Telehealth: Payer: Self-pay | Admitting: Gastroenterology

## 2017-02-05 NOTE — Telephone Encounter (Signed)
-----   Message from Greggory Keen, LPN sent at 15/87/2761  8:59 AM EST ----- Would you call her to schedule an appointment. Follow up visit. [place]. Thank you. ----- Message ----- From: Greggory Keen, LPN Sent: 84/85/9276 To: Greggory Keen, LPN  She must have a follow up appointment in January. Check.

## 2017-02-05 NOTE — Telephone Encounter (Signed)
Spoke to spouse. She will call back to schedule follow up appointment in January 2019.

## 2017-02-07 DIAGNOSIS — Z23 Encounter for immunization: Secondary | ICD-10-CM | POA: Diagnosis not present

## 2017-02-07 DIAGNOSIS — L570 Actinic keratosis: Secondary | ICD-10-CM | POA: Diagnosis not present

## 2017-02-07 DIAGNOSIS — L820 Inflamed seborrheic keratosis: Secondary | ICD-10-CM | POA: Diagnosis not present

## 2017-02-07 DIAGNOSIS — D485 Neoplasm of uncertain behavior of skin: Secondary | ICD-10-CM | POA: Diagnosis not present

## 2017-02-07 DIAGNOSIS — C44729 Squamous cell carcinoma of skin of left lower limb, including hip: Secondary | ICD-10-CM | POA: Diagnosis not present

## 2017-03-13 DIAGNOSIS — L089 Local infection of the skin and subcutaneous tissue, unspecified: Secondary | ICD-10-CM | POA: Diagnosis not present

## 2017-03-13 DIAGNOSIS — Z23 Encounter for immunization: Secondary | ICD-10-CM | POA: Diagnosis not present

## 2017-03-13 DIAGNOSIS — D1801 Hemangioma of skin and subcutaneous tissue: Secondary | ICD-10-CM | POA: Diagnosis not present

## 2017-03-13 DIAGNOSIS — Z86018 Personal history of other benign neoplasm: Secondary | ICD-10-CM | POA: Diagnosis not present

## 2017-03-13 DIAGNOSIS — L814 Other melanin hyperpigmentation: Secondary | ICD-10-CM | POA: Diagnosis not present

## 2017-03-13 DIAGNOSIS — L570 Actinic keratosis: Secondary | ICD-10-CM | POA: Diagnosis not present

## 2017-03-13 DIAGNOSIS — D485 Neoplasm of uncertain behavior of skin: Secondary | ICD-10-CM | POA: Diagnosis not present

## 2017-03-13 DIAGNOSIS — L821 Other seborrheic keratosis: Secondary | ICD-10-CM | POA: Diagnosis not present

## 2017-03-13 DIAGNOSIS — Z85828 Personal history of other malignant neoplasm of skin: Secondary | ICD-10-CM | POA: Diagnosis not present

## 2017-03-19 DIAGNOSIS — L905 Scar conditions and fibrosis of skin: Secondary | ICD-10-CM | POA: Diagnosis not present

## 2017-03-19 DIAGNOSIS — C44729 Squamous cell carcinoma of skin of left lower limb, including hip: Secondary | ICD-10-CM | POA: Diagnosis not present

## 2017-03-28 ENCOUNTER — Other Ambulatory Visit: Payer: Self-pay | Admitting: Pediatrics

## 2017-03-28 DIAGNOSIS — I10 Essential (primary) hypertension: Secondary | ICD-10-CM

## 2017-04-04 ENCOUNTER — Encounter: Payer: Self-pay | Admitting: Pediatrics

## 2017-04-04 ENCOUNTER — Ambulatory Visit (INDEPENDENT_AMBULATORY_CARE_PROVIDER_SITE_OTHER): Payer: Medicare Other | Admitting: Pediatrics

## 2017-04-04 VITALS — BP 120/81 | HR 82 | Temp 97.2°F | Ht 59.0 in | Wt 133.4 lb

## 2017-04-04 DIAGNOSIS — Z23 Encounter for immunization: Secondary | ICD-10-CM

## 2017-04-04 DIAGNOSIS — E785 Hyperlipidemia, unspecified: Secondary | ICD-10-CM | POA: Diagnosis not present

## 2017-04-04 DIAGNOSIS — F411 Generalized anxiety disorder: Secondary | ICD-10-CM

## 2017-04-04 DIAGNOSIS — I10 Essential (primary) hypertension: Secondary | ICD-10-CM

## 2017-04-04 DIAGNOSIS — I6521 Occlusion and stenosis of right carotid artery: Secondary | ICD-10-CM | POA: Diagnosis not present

## 2017-04-04 DIAGNOSIS — K219 Gastro-esophageal reflux disease without esophagitis: Secondary | ICD-10-CM | POA: Diagnosis not present

## 2017-04-04 DIAGNOSIS — M810 Age-related osteoporosis without current pathological fracture: Secondary | ICD-10-CM | POA: Diagnosis not present

## 2017-04-04 MED ORDER — PRAVASTATIN SODIUM 20 MG PO TABS
20.0000 mg | ORAL_TABLET | Freq: Every day | ORAL | 3 refills | Status: DC
Start: 2017-04-04 — End: 2018-02-08

## 2017-04-04 MED ORDER — LISINOPRIL 10 MG PO TABS: 10.0000 mg | ORAL_TABLET | Freq: Every day | ORAL | 2 refills | Status: DC

## 2017-04-04 MED ORDER — RISEDRONATE SODIUM 150 MG PO TABS: 150.0000 mg | ORAL_TABLET | ORAL | 3 refills | Status: DC

## 2017-04-04 MED ORDER — ESOMEPRAZOLE MAGNESIUM 20 MG PO CPDR: 20.0000 mg | DELAYED_RELEASE_CAPSULE | Freq: Every day | ORAL | 1 refills | Status: DC

## 2017-04-04 MED ORDER — ESCITALOPRAM OXALATE 10 MG PO TABS: 10.0000 mg | ORAL_TABLET | Freq: Every day | ORAL | 3 refills | Status: DC

## 2017-04-04 NOTE — Addendum Note (Signed)
Addended by: Wardell Heath on: 04/04/2017 08:52 AM   Modules accepted: Orders

## 2017-04-04 NOTE — Progress Notes (Signed)
  Subjective:   Patient ID: Penny Howard, female    DOB: 15-May-1945, 72 y.o.   MRN: [id number] CC: Follow-up (6 month) multiple med problems HPI: Penny Howard is a 72 y.o. female presenting for Follow-up (6 month)  Anxiety: takes lexapro daily, helps take edge off of anxiety, mood has been fine  GERD: takes nexium every other day at times, when more symptoms with certain foods back to daily  HLD, carotid artery stenosis: takes pravastatin at night, no s/e  Elevated liver enzymes: following with GI  Osteoporosis: takes bisphosphonate once a month  Relevant past medical, surgical, family and social history reviewed. Allergies and medications reviewed and updated. Social History   Tobacco Use  Smoking Status Never Smoker  Smokeless Tobacco Never Used   ROS: Per HPI   Objective:    BP 120/81   Pulse 82   Temp (!) 97.2 F (36.2 C) (Oral)   Ht 4' 11"  (1.499 m)   Wt 133 lb 6.4 oz (60.5 kg)   BMI 26.94 kg/m   Wt Readings from Last 3 Encounters:  04/04/17 133 lb 6.4 oz (60.5 kg)  11/22/16 131 lb 4 oz (59.5 kg)  08/30/16 132 lb 3.2 oz (60 kg)    Gen: NAD, alert, cooperative with exam, NCAT EYES: EOMI, no conjunctival injection, or no icterus ENT:  OP without erythema LYMPH: no cervical LAD Neck: bruit present b/l CV: NRRR, normal S1/S2, no murmur, distal pulses 2+ b/l Resp: CTABL, no wheezes, normal WOB Abd: +BS, soft, NTND. no guarding or organomegaly Ext: No edema, warm Neuro: Alert and oriented, strength equal b/l UE and LE, coordination grossly normal MSK: normal muscle bulk  Assessment & Plan:  Penny Howard was seen today for follow-up multiple med problems  Diagnoses and all orders for this visit:  Stenosis of right carotid artery Due for repeat u/s 08/2017, on statin  Anxiety state Stable, continue below -     escitalopram (LEXAPRO) 10 MG tablet; Take 1 tablet (10 mg total) by mouth daily.  Gastroesophageal reflux disease, esophagitis presence not  specified Stable, cont to decrease below as able, use PRNs when needed -     esomeprazole (NEXIUM) 20 MG capsule; Take 1 capsule (20 mg total) by mouth daily at 12 noon.  Benign essential HTN Stable, cont below -     lisinopril (PRINIVIL,ZESTRIL) 10 MG tablet; Take 1 tablet (10 mg total) by mouth daily.  Hyperlipidemia, unspecified hyperlipidemia type Stable, cont below -     pravastatin (PRAVACHOL) 20 MG tablet; Take 1 tablet (20 mg total) by mouth daily.  Osteoporosis, unspecified osteoporosis type, unspecified pathological fracture presence Stable, cont below -     risedronate (ACTONEL) 150 MG tablet; Take 1 tablet (150 mg total) by mouth every 30 (thirty) days. with water on empty stomach, nothing by mouth or lie down for next 30 minutes.   Follow up plan: Return in about 6 months (around 10/02/2017). Assunta Found, MD [place]

## 2017-04-23 ENCOUNTER — Encounter: Payer: Self-pay | Admitting: Pediatrics

## 2017-04-23 ENCOUNTER — Ambulatory Visit (INDEPENDENT_AMBULATORY_CARE_PROVIDER_SITE_OTHER): Payer: Medicare Other

## 2017-04-23 ENCOUNTER — Ambulatory Visit (INDEPENDENT_AMBULATORY_CARE_PROVIDER_SITE_OTHER): Payer: Medicare Other | Admitting: Pediatrics

## 2017-04-23 VITALS — BP 131/76 | HR 74 | Temp 97.2°F | Ht 59.0 in | Wt 133.6 lb

## 2017-04-23 DIAGNOSIS — R1030 Lower abdominal pain, unspecified: Secondary | ICD-10-CM

## 2017-04-23 DIAGNOSIS — I6521 Occlusion and stenosis of right carotid artery: Secondary | ICD-10-CM

## 2017-04-23 NOTE — Progress Notes (Signed)
  Subjective:   Patient ID: ANEA FODERA, female    DOB: 06/03/1945, 72 y.o.   MRN: [id number] CC: Abdominal Pain (lower, 10 days)  HPI: KOHANA AMBLE is a 72 y.o. female presenting for Abdominal Pain (lower, 10 days)  Woke up one day with it. Feels tight, espiecially when she stands up, feels tight, like stomach is "falling" out".  Worse the day after exertion such as walking. Has been able to stay active. Doesn't feel crampy. Feels "pulling".   Had a tummy tuck 3.5 yrs. No blood in stools, normal stooling.   Relevant past medical, surgical, family and social history reviewed. Allergies and medications reviewed and updated. Social History   Tobacco Use  Smoking Status Never Smoker  Smokeless Tobacco Never Used   ROS: Per HPI   Objective:    BP 131/76   Pulse 74   Temp (!) 97.2 F (36.2 C) (Oral)   Ht 4' 11"  (1.499 m)   Wt 133 lb 9.6 oz (60.6 kg)   BMI 26.98 kg/m   Wt Readings from Last 3 Encounters:  04/23/17 133 lb 9.6 oz (60.6 kg)  04/04/17 133 lb 6.4 oz (60.5 kg)  11/22/16 131 lb 4 oz (59.5 kg)    Gen: NAD, alert, well-appearing EYES: EOMI, no conjunctival injection, or no icterus ENT:  TMs pearly gray b/l, OP without erythema LYMPH: no cervical LAD CV: NRRR, normal S1/S2, no murmur, distal pulses 2+ b/l Resp: CTABL, no wheezes, normal WOB Abd: +BS, soft, NTND. no guarding or organomegaly, ttp lower abd throughout, mo masses, no rebound or peritoneal signs Ext: No edema, warm Neuro: Alert and oriented, strength equal b/l UE and LE, coordination grossly normal MSK: normal muscle bulk  Assessment & Plan:  Marabella was seen today for abdominal pain.  Diagnoses and all orders for this visit:  Lower abdominal pain Xray with moderate amount of stool in colon, possible constipation contributing to symptoms. Pt will take 4 doses of miralax in the morning, repeat in the afternoon if needed for good return of stool. rtc if not improving. -     DG Abd 1 View;  Future   Follow up plan: Return in about 4 weeks (around 05/21/2017). Assunta Found, MD [place]

## 2017-04-30 ENCOUNTER — Other Ambulatory Visit: Payer: Medicare Other

## 2017-04-30 ENCOUNTER — Other Ambulatory Visit (INDEPENDENT_AMBULATORY_CARE_PROVIDER_SITE_OTHER): Payer: Medicare Other

## 2017-04-30 ENCOUNTER — Encounter: Payer: Self-pay | Admitting: Gastroenterology

## 2017-04-30 ENCOUNTER — Ambulatory Visit (INDEPENDENT_AMBULATORY_CARE_PROVIDER_SITE_OTHER): Payer: Medicare Other | Admitting: Gastroenterology

## 2017-04-30 VITALS — BP 160/74 | HR 76 | Ht 59.0 in | Wt 131.0 lb

## 2017-04-30 DIAGNOSIS — K7581 Nonalcoholic steatohepatitis (NASH): Secondary | ICD-10-CM

## 2017-04-30 DIAGNOSIS — R945 Abnormal results of liver function studies: Secondary | ICD-10-CM | POA: Diagnosis not present

## 2017-04-30 DIAGNOSIS — K7400 Hepatic fibrosis, unspecified: Secondary | ICD-10-CM

## 2017-04-30 DIAGNOSIS — R7989 Other specified abnormal findings of blood chemistry: Secondary | ICD-10-CM

## 2017-04-30 DIAGNOSIS — R1032 Left lower quadrant pain: Secondary | ICD-10-CM

## 2017-04-30 DIAGNOSIS — I6521 Occlusion and stenosis of right carotid artery: Secondary | ICD-10-CM | POA: Diagnosis not present

## 2017-04-30 LAB — COMPREHENSIVE METABOLIC PANEL
ALK PHOS: 34 U/L — AB (ref 39–117)
ALT: 21 U/L (ref 0–35)
AST: 23 U/L (ref 0–37)
Albumin: 4.2 g/dL (ref 3.5–5.2)
BILIRUBIN TOTAL: 0.4 mg/dL (ref 0.2–1.2)
BUN: 13 mg/dL (ref 6–23)
CALCIUM: 9.9 mg/dL (ref 8.4–10.5)
CHLORIDE: 105 meq/L (ref 96–112)
CO2: 25 mEq/L (ref 19–32)
Creatinine, Ser: 0.7 mg/dL (ref 0.40–1.20)
GFR: 88.02 mL/min (ref >60.00)
Glucose, Bld: 106 mg/dL — ABNORMAL HIGH (ref 70–99)
Potassium: 4.5 mEq/L (ref 3.5–5.1)
SODIUM: 139 meq/L (ref 135–145)
TOTAL PROTEIN: 7.6 g/dL (ref 6.0–8.3)

## 2017-04-30 NOTE — Patient Instructions (Addendum)
You have been scheduled for a CT scan of the abdomen and pelvis at [facility] ([address]---this is in the same building as [facility]).   You are scheduled on 05/08/2017 at 1:30pm. You should arrive 15 minutes prior to your appointment time for registration. Please follow the written instructions below on the day of your exam:  WARNING: IF YOU ARE ALLERGIC TO IODINE/X-RAY DYE, PLEASE NOTIFY RADIOLOGY IMMEDIATELY AT [phone]! YOU WILL BE GIVEN A 13 HOUR PREMEDICATION PREP.  1) Do not eat or drink anything after 9:30am (4 hours prior to your test) 2) You have been given 2 bottles of oral contrast to drink. The solution may taste               better if refrigerated, but do NOT add ice or any other liquid to this solution. Shake             well before drinking.    Drink 1 bottle of contrast @ 11:30am (2 hours prior to your exam)  Drink 1 bottle of contrast @ 12:30pm (1 hour prior to your exam)  You may take any medications as prescribed with a small amount of water except for the following: Metformin, Glucophage, Glucovance, Avandamet, Riomet, Fortamet, Actoplus Met, Janumet, Glumetza or Metaglip. The above medications must be held the day of the exam AND 48 hours after the exam.  The purpose of you drinking the oral contrast is to aid in the visualization of your intestinal tract. The contrast solution may cause some diarrhea. Before your exam is started, you will be given a small amount of fluid to drink. Depending on your individual set of symptoms, you may also receive an intravenous injection of x-ray contrast/dye. Plan on being at [facility] for 30 minutes or longer, depending on the type of exam you are having performed.  This test typically takes 30-45 minutes to complete.  If you have any questions regarding your exam or if you need to reschedule, you may call the CT department at [phone] between the hours of 8:00 am and 5:00 pm,  Monday-Friday.   Go to the basement for labs today  Follow up in 6 months   If you are age 38 or older, your body mass index should be between 23-30. Your Body mass index is 26.46 kg/m. If this is out of the aforementioned range listed, please consider follow up with your Primary Care Provider.  If you are age 62 or younger, your body mass index should be between 19-25. Your Body mass index is 26.46 kg/m. If this is out of the aformentioned range listed, please consider follow up with your Primary Care Provider.   ________________________________________________________________________

## 2017-04-30 NOTE — Progress Notes (Addendum)
Penny Howard    [id number]    Apr 13, 1945  Primary Care Physician:Vincent, Berlin Hun, MD  Referring Physician: Eustaquio Maize, MD [place], [place] [address]  Chief complaint: Penny Howard, liver fibrosis  HPI:  72 year old female with history of breast cancer in remission, elevated AST and ALT to low 100s thought to be secondary to nonalcoholic steatohepatitis is here for follow-up visit.  Workup so far negative for infectious, autoimmune or any underlying chronic liver disease.  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  She is avoiding alcohol and NSAIDs.  Also trying to eat low-carb and low-fat diet. Denies any lower extremity edema, confusion, fever, chills, joint pain, rash, nausea, vomiting, abdominal pain, melena or bright red blood per rectum    Outpatient Encounter Medications as of 04/30/2017  Medication Sig  . aspirin EC 81 MG tablet Take 81 mg by mouth daily.  Marland Kitchen escitalopram (LEXAPRO) 10 MG tablet Take 1 tablet (10 mg total) by mouth daily.  Marland Kitchen esomeprazole (NEXIUM) 20 MG capsule Take 1 capsule (20 mg total) by mouth daily at 12 noon.  Marland Kitchen lisinopril (PRINIVIL,ZESTRIL) 10 MG tablet Take 1 tablet (10 mg total) by mouth daily.  . multivitamin-lutein (OCUVITE-LUTEIN) CAPS capsule Take 1 capsule by mouth daily. 250 mg capsule  . pravastatin (PRAVACHOL) 20 MG tablet Take 1 tablet (20 mg total) by mouth daily.  . risedronate (ACTONEL) 150 MG tablet Take 1 tablet (150 mg total) by mouth every 30 (thirty) days. with water on empty stomach, nothing by mouth or lie down for next 30 minutes.  . tamoxifen (NOLVADEX) 20 MG tablet TAKE 1 TABLET DAILY  . vitamin E (VITAMIN E) 400 UNIT capsule Take 400 Units by mouth daily.   No facility-administered encounter medications on file as of 04/30/2017.     Allergies as of 04/30/2017 - Review Complete 04/30/2017  Allergen Reaction Noted  . Codeine Nausea Only 06/20/2013    Past Medical History:  Diagnosis Date   . Allergy   . Anxiety   . Cancer ([place])    breast rt  . GERD (gastroesophageal reflux disease)   . High cholesterol   . Hypertension   . Liver fibrosis   . Osteoporosis   . Squamous cell carcinoma   . Stress incontinence   . Wears contact lenses     Past Surgical History:  Procedure Laterality Date  . ABDOMINAL HYSTERECTOMY  1990   ovaries intact b/l  . BLADDER SURGERY  10   mesh  . COLONOSCOPY    . LIPOSUCTION WITH LIPOFILLING Right 04/24/2014   Procedure: LIPOFILLING TO RIGHT CHEST/RIGHT NIPPLE AREOLA COMPLEX CREATION WITH FULL THICKNESS SKIN GRAFT/REVISION OF RIGHT TRANSFLAP;  Surgeon: Irene Limbo, MD;  Location: [facility];  Service: Plastics;  Laterality: Right;  . MASTECTOMY Right 07/04/2013  . MASTECTOMY W/ SENTINEL NODE BIOPSY  07/04/2013   RT TOTAL            DR [place]  . SCAR REVISION Right 04/24/2014   Procedure: SCAR REVISION;  Surgeon: Irene Limbo, MD;  Location: [facility];  Service: Plastics;  Laterality: Right;  . SIMPLE MASTECTOMY WITH AXILLARY SENTINEL NODE BIOPSY Right 07/04/2013   Procedure: RIGHT TOTAL MASTECTOMY WITH RIGHT  AXILLARY SENTINEL LYMPH NODE BIOPSY;  Surgeon: Edward Jolly, MD;  Location: [facility];  Service: General;  Laterality: Right;  . SKIN FULL THICKNESS GRAFT Right 04/24/2014   Procedure: SKIN GRAFT  FULL THICKNESS;  Surgeon: Irene Limbo, MD;  Location: [facility];  Service: Plastics;  Laterality: Right;  . SQUAMOUS CELL CARCINOMA EXCISION Right    Lower Leg  . TRAM Right 12/30/2013   Procedure: TRANSVERSE RECTUS ABDOMINIS MYOCUTANEOUS (TRAM) FLAP FOR RIGHT BREAST RECONSTRUCTION;  Surgeon: Irene Limbo, MD;  Location: [facility];  Service: Plastics;  Laterality: Right;    Family History  Problem Relation Age of Onset  . Breast cancer Mother   . Heart failure Father   . Breast cancer Paternal Aunt   . Lymphoma Cousin        non- hodgkins lymphoma  . Colon cancer Neg Hx   .  Rectal cancer Neg Hx   . Esophageal cancer Neg Hx   . Stomach cancer Neg Hx   . Liver cancer Neg Hx     Social History   Socioeconomic History  . Marital status: Married    Spouse name: Not on file  . Number of children: 2  . Years of education: Not on file  . Highest education level: Not on file  Social Needs  . Financial resource strain: Not on file  . Food insecurity - worry: Not on file  . Food insecurity - inability: Not on file  . Transportation needs - medical: Not on file  . Transportation needs - non-medical: Not on file  Occupational History  . Occupation: retired  Tobacco Use  . Smoking status: Never Smoker  . Smokeless tobacco: Never Used  Substance and Sexual Activity  . Alcohol use: Yes    Alcohol/week: 3.0 oz    Types: 5 Glasses of wine per week    Comment: occaionally  . Drug use: No  . Sexual activity: Yes  Other Topics Concern  . Not on file  Social History Narrative  . Not on file      Review of systems: Review of Systems  Constitutional: Negative for fever and chills.  HENT: Negative.   Eyes: Negative for blurred vision.  Respiratory: Negative for cough, shortness of breath and wheezing.   Cardiovascular: Negative for chest pain and palpitations.  Gastrointestinal: as per HPI Genitourinary: Negative for dysuria, urgency, frequency and hematuria.  Musculoskeletal: Positive for myalgias, back pain and joint pain.  Skin: Negative for itching and rash.  Neurological: Negative for dizziness, tremors, focal weakness, seizures and loss of consciousness.  Endo/Heme/Allergies: Negative for seasonal allergies.  Psychiatric/Behavioral: Negative for depression, suicidal ideas and hallucinations.  All other systems reviewed and are negative.   Physical Exam: Vitals:   04/30/17 0830  BP: (!) 160/74  Pulse: 76   Body mass index is 26.46 kg/m. Gen:      No acute distress HEENT:  EOMI, sclera anicteric Neck:     No masses; no thyromegaly Lungs:     Clear to auscultation bilaterally; normal respiratory effort CV:         Regular rate and rhythm; no murmurs Abd:      + bowel sounds; soft, non-tender; no palpable masses, no distension Ext:    No edema; adequate peripheral perfusion Skin:      Warm and dry; no rash Neuro: alert and oriented x 3 Psych: normal mood and affect  Data Reviewed:  Reviewed labs, radiology imaging, old records and pertinent past GI work up  Colonoscopy 10/29/2015: Sigmoid diverticulosis and internal hemorrhoids Assessment and Plan/Recommendations:  72 year old female with hyperlipidemia, breast cancer in remission with abnormal elevation of transaminases secondary to nonalcoholic steatohepatitis and advanced fibrosis Based on  clinical exam and labs patient does not have any features of liver decompensation or portal hypertension Recheck CMP Advised patient to continue to avoid alcohol, NSAIDs, herbal remedies that can be toxic to the liver. Continue low-carb diet and exercise. Return in 6 months or sooner if needed   25 minutes was spent face-to-face with the patient. Greater than 50% of the time used for counseling as well as treatment plan and follow-up. She had multiple questions which were answered to her satisfaction  K. Denzil Magnuson , MD [phone] Mon-Fri 8a-5p [phone] after 5p, weekends, holidays  CC: Eustaquio Maize, MD   RLQ abdominal pain worse with activity, radiating to the back; likely musculoskeletal in nature but will need to exclude any acute GI pathology ,schedule for CT abd & pelvis

## 2017-05-08 ENCOUNTER — Ambulatory Visit (INDEPENDENT_AMBULATORY_CARE_PROVIDER_SITE_OTHER)
Admission: RE | Admit: 2017-05-08 | Discharge: 2017-05-08 | Disposition: A | Discharge status: Home / Self Care | Payer: Medicare Other | Source: Ambulatory Visit | Attending: Gastroenterology | Admitting: Gastroenterology

## 2017-05-08 DIAGNOSIS — R945 Abnormal results of liver function studies: Secondary | ICD-10-CM

## 2017-05-08 DIAGNOSIS — R1031 Right lower quadrant pain: Secondary | ICD-10-CM | POA: Diagnosis not present

## 2017-05-08 DIAGNOSIS — R7989 Other specified abnormal findings of blood chemistry: Secondary | ICD-10-CM

## 2017-05-08 MED ORDER — IOPAMIDOL (ISOVUE-300) INJECTION 61%
100.0000 mL | Freq: Once | INTRAVENOUS | Status: AC | PRN
  Administered 2017-05-08: 100 mL via INTRAVENOUS

## 2017-05-10 ENCOUNTER — Telehealth: Payer: Self-pay | Admitting: Gastroenterology

## 2017-05-15 NOTE — Telephone Encounter (Signed)
Provider will review the results.

## 2017-05-17 ENCOUNTER — Telehealth: Payer: Self-pay | Admitting: Pediatrics

## 2017-05-17 DIAGNOSIS — K8020 Calculus of gallbladder without cholecystitis without obstruction: Secondary | ICD-10-CM

## 2017-05-17 NOTE — Telephone Encounter (Signed)
I don't see a referral for General Surgery, do you want one put in?

## 2017-05-17 NOTE — Telephone Encounter (Signed)
Patient aware that referral has been placed.

## 2017-05-17 NOTE — Telephone Encounter (Signed)
Per imaging note, may already have referral pending from 3/26 to [facility]. I put in referral to them as well for evaluation of for possible gallbladder surgery.

## 2017-05-23 ENCOUNTER — Telehealth: Payer: Self-pay | Admitting: *Deleted

## 2017-05-23 NOTE — Telephone Encounter (Signed)
Patient has an appointment with Dr Johney Maine on 05/29/2017 at 10:15am  Patient notified by [place]

## 2017-05-29 DIAGNOSIS — S39011A Strain of muscle, fascia and tendon of abdomen, initial encounter: Secondary | ICD-10-CM | POA: Diagnosis not present

## 2017-05-29 DIAGNOSIS — K8020 Calculus of gallbladder without cholecystitis without obstruction: Secondary | ICD-10-CM | POA: Diagnosis not present

## 2017-05-29 DIAGNOSIS — K740 Hepatic fibrosis: Secondary | ICD-10-CM | POA: Diagnosis not present

## 2017-05-29 DIAGNOSIS — K5909 Other constipation: Secondary | ICD-10-CM | POA: Diagnosis not present

## 2017-06-13 ENCOUNTER — Other Ambulatory Visit: Payer: Self-pay | Admitting: Oncology

## 2017-06-13 DIAGNOSIS — Z1231 Encounter for screening mammogram for malignant neoplasm of breast: Secondary | ICD-10-CM

## 2017-07-24 ENCOUNTER — Ambulatory Visit
Admission: RE | Admit: 2017-07-24 | Discharge: 2017-07-24 | Disposition: A | Discharge status: Home / Self Care | Payer: Medicare Other | Source: Ambulatory Visit | Attending: Oncology | Admitting: Oncology

## 2017-07-24 DIAGNOSIS — Z1231 Encounter for screening mammogram for malignant neoplasm of breast: Secondary | ICD-10-CM

## 2017-08-02 ENCOUNTER — Other Ambulatory Visit: Payer: Self-pay

## 2017-08-02 DIAGNOSIS — Z170 Estrogen receptor positive status [ER+]: Principal | ICD-10-CM

## 2017-08-02 DIAGNOSIS — C50411 Malignant neoplasm of upper-outer quadrant of right female breast: Secondary | ICD-10-CM

## 2017-08-05 NOTE — Progress Notes (Signed)
[facility]  [phone] [phone]     ID: Penny Howard OB: 07/25/1945  MR#: [id number]  OFH#:[id number]  PCP: Penny Maize, MD GYN:   SU: Penny Howard OTHER MD: Penny Howard, Penny Howard  CHIEF COMPLAINT: Estrogen receptor positive breast cancer  CURRENT TREATMENT: Tamoxifen  BREAST CANCER HISTORY: From the original intake note:  Penny Howard was bending over to get into her hot tub when she noted a mass in her right breast. She intended to keep it to herself, but her husband of 83 years, Penny Howard, noted her changed expression, asked her what it was about, and when she told him they got out of the top and called on her primary care physician. On 06/02/2013 the patient underwent right breast mammography and ultrasonography,  which showed an area of increased density in the superior aspect of the right breast, which was palpable and firm. By palpation it measured approximately 5 cm. Ultrasound located an irregular mass in the right breast at the 12:00 position measuring 4 cm maximally. Ultrasound of the right axilla showed normal axillary contents.  Biopsy of the right breast mass in question 06/02/2013 ([id number]) confirmed an invasive lobular tumor, grade 2, estrogen and progesterone receptors both 100% positive in both with strong staining intensity, with an MIB-1 of 80%. HER-2 is pending.  On 06/03/2013 the patient underwent left breast mammography, which was negative. On 06/09/2013 the patient underwent bilateral breast MRI. This showed, in the left breast, multiple enhancing nodules in the central portion measuring up to 4.6 cm in aggregate. There was evidence of nipple retraction. The largest single  discrete mass measured 2.7 cm. There was no involvement of the pectoralis and no abnormal appearing lymph nodes. The right breast was unremarkable.  The patient's subsequent history is as detailed below  INTERVAL HISTORY: Penny Howard returns today for  follow-up and treatment of her estrogen receptor positive breast cancer. She continues on tamoxifen, with excellent tolerance.  She has minimal hot flashes which are not a problem for her.  Since her last visit, she underwent diagnostic unilateral left breast mammography with CAD and tomography on 07/24/2017 at [facility] showing: breast density category C. There was no evidence of malignancy.  She also completed a CT abdomen and pelvis for right lower quadrant pain on 05/08/2017 showing: 15 cm distal ileum and majority of the stomach under distended limiting evaluation of such however, no extraluminal bowel inflammatory process is noted. Specifically, no inflammation surrounds the appendix. Fatty liver. 1 cm gallstone. Extrarenal pelvis configuration bilaterally more notable on the right without obstructing stone. Possible bilateral renal cysts although left renal lesions too small to adequately characterize. Aortic Atherosclerosis (ICD10-I70.0). Prominent degenerative changes L5-S1.   REVIEW OF SYSTEMS: Penny Howard went back to [place] and exercise to a little bit too vigorously so she got a cramp in 1 of her abdominal muscles which lasted about 2 months.  She is now back to [place].  She also does walking, and she travels to see her family.  Aside from these issues a detailed review of systems today was stable  PAST MEDICAL HISTORY: Past Medical History:  Diagnosis Date  . Allergy   . Anxiety   . Cancer ([place])    breast rt  . GERD (gastroesophageal reflux disease)   . High cholesterol   . Hypertension   . Liver fibrosis   . Osteoporosis   . Squamous cell carcinoma   . Stress incontinence   . Wears contact lenses  PAST SURGICAL HISTORY: Past Surgical History:  Procedure Laterality Date  . ABDOMINAL HYSTERECTOMY  1990   ovaries intact b/l  . BLADDER SURGERY  10   mesh  . COLONOSCOPY    . LIPOSUCTION WITH LIPOFILLING Right 04/24/2014   Procedure: LIPOFILLING TO RIGHT CHEST/RIGHT  NIPPLE AREOLA COMPLEX CREATION WITH FULL THICKNESS SKIN GRAFT/REVISION OF RIGHT TRANSFLAP;  Surgeon: Irene Limbo, MD;  Location: [facility];  Service: Plastics;  Laterality: Right;  . MASTECTOMY Right 07/04/2013  . MASTECTOMY W/ SENTINEL NODE BIOPSY  07/04/2013   RT TOTAL            DR [place]  . SCAR REVISION Right 04/24/2014   Procedure: SCAR REVISION;  Surgeon: Irene Limbo, MD;  Location: [facility];  Service: Plastics;  Laterality: Right;  . SIMPLE MASTECTOMY WITH AXILLARY SENTINEL NODE BIOPSY Right 07/04/2013   Procedure: RIGHT TOTAL MASTECTOMY WITH RIGHT  AXILLARY SENTINEL LYMPH NODE BIOPSY;  Surgeon: Edward Jolly, MD;  Location: [facility];  Service: General;  Laterality: Right;  . SKIN FULL THICKNESS GRAFT Right 04/24/2014   Procedure: SKIN GRAFT FULL THICKNESS;  Surgeon: Irene Limbo, MD;  Location: [facility];  Service: Plastics;  Laterality: Right;  . SQUAMOUS CELL CARCINOMA EXCISION Right    Lower Leg  . TRAM Right 12/30/2013   Procedure: TRANSVERSE RECTUS ABDOMINIS MYOCUTANEOUS (TRAM) FLAP FOR RIGHT BREAST RECONSTRUCTION;  Surgeon: Irene Limbo, MD;  Location: [facility];  Service: Plastics;  Laterality: Right;    FAMILY HISTORY Family History  Problem Relation Age of Onset  . Breast cancer Mother   . Heart failure Father   . Breast cancer Paternal Aunt   . Lymphoma Cousin        non- hodgkins lymphoma  . Colon cancer Neg Hx   . Rectal cancer Neg Hx   . Esophageal cancer Neg Hx   . Stomach cancer Neg Hx   . Liver cancer Neg Hx    the patient's father died with congestive heart failure at the age of 79. The patient's mother died from "multiple causes" at the age of 43. Penny Howard had one brother, no sisters. The patient's mother was diagnosed with breast cancer the age of 63 there is also a paternal aunt diagnosed with breast cancer at the age of 66.  GYNECOLOGIC HISTORY:  Menarche age 57, first live birth age 59, the  patient is [place] P2. She stopped having periods approximately 1990. She did not take hormone replacement. 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 Her husband Broadus John (goes by "Penny Howard") used to work Sales executive stations. Their name is pronounced "Nam-zura"--it is of [place] origin. Daughter Penny Howard lives in [place] for she is a Nurse, children's and daughter Penny Howard lives in [place] where she is a housewife. The patient has 1 grandchild. She attends a [employer].    ADVANCED DIRECTIVES: Not in place   HEALTH MAINTENANCE: Social History   Tobacco Use  . Smoking status: Never Smoker  . Smokeless tobacco: Never Used  Substance Use Topics  . Alcohol use: Yes    Alcohol/week: 3.0 oz    Types: 5 Glasses of wine per week    Comment: occaionally  . Drug use: No     Colonoscopy: 2001?/Dr. Mann  PAP:  Bone density: 2014/osteopenia  Lipid panel:  Allergies  Allergen Reactions  . Codeine Nausea Only  Current Outpatient Medications  Medication Sig Dispense Refill  . aspirin EC 81 MG tablet Take 81 mg by mouth daily.    Marland Kitchen escitalopram (LEXAPRO) 10 MG tablet Take 1 tablet (10 mg total) by mouth daily. 90 tablet 3  . esomeprazole (NEXIUM) 20 MG capsule Take 1 capsule (20 mg total) by mouth daily at 12 noon. 90 capsule 1  . lisinopril (PRINIVIL,ZESTRIL) 10 MG tablet Take 1 tablet (10 mg total) by mouth daily. 90 tablet 2  . multivitamin-lutein (OCUVITE-LUTEIN) CAPS capsule Take 1 capsule by mouth daily. 250 mg capsule    . pravastatin (PRAVACHOL) 20 MG tablet Take 1 tablet (20 mg total) by mouth daily. 90 tablet 3  . risedronate (ACTONEL) 150 MG tablet Take 1 tablet (150 mg total) by mouth every 30 (thirty) days. with water on empty stomach, nothing by mouth or lie down for next 30 minutes. 3 tablet 3  . tamoxifen (NOLVADEX) 20 MG tablet Take 1 tablet (20 mg  total) by mouth daily. 90 tablet 3  . vitamin E (VITAMIN E) 400 UNIT capsule Take 400 Units by mouth daily.     No current facility-administered medications for this visit.     OBJECTIVE: Middle-aged white woman who appears well  Vitals:   08/06/17 1209  BP: (!) 143/70  Pulse: 62  Resp: 18  Temp: 97.7 F (36.5 C)  SpO2: 100%     Body mass index is 27.04 kg/m.    ECOG FS:0 - Asymptomatic  Sclerae unicteric, pupils round and equal Oropharynx clear and moist No cervical or supraclavicular adenopathy Lungs no rales or rhonchi Heart regular rate and rhythm Abd soft, nontender, positive bowel sounds MSK no focal spinal tenderness, no upper extremity lymphedema Neuro: nonfocal, well oriented, appropriate affect Breasts: There is post right mastectomy, status post TRAM reconstruction.  There is no evidence of local recurrence.  The left breast is benign.  Both axillae are benign.  LAB RESULTS:  CMP     Component Value Date/Time   NA 141 08/06/2017 1115   NA 140 08/30/2016 0956   NA 141 08/07/2016 1231   K 4.2 08/06/2017 1115   K 4.3 08/07/2016 1231   CL 106 08/06/2017 1115   CO2 25 08/06/2017 1115   CO2 27 08/07/2016 1231   GLUCOSE 97 08/06/2017 1115   GLUCOSE 91 08/07/2016 1231   BUN 12 08/06/2017 1115   BUN 12 08/30/2016 0956   BUN 10.9 08/07/2016 1231   CREATININE 0.81 08/06/2017 1115   CREATININE 0.8 08/07/2016 1231   CALCIUM 9.8 08/06/2017 1115   CALCIUM 10.0 08/07/2016 1231   PROT 7.5 08/06/2017 1115   PROT 7.2 08/30/2016 0956   PROT 7.7 08/07/2016 1231   ALBUMIN 4.2 08/06/2017 1115   ALBUMIN 4.4 08/30/2016 0956   ALBUMIN 4.1 08/07/2016 1231   AST 32 08/06/2017 1115   AST 129 (H) 08/07/2016 1231   ALT 29 08/06/2017 1115   ALT 91 (H) 08/07/2016 1231   ALKPHOS 37 (L) 08/06/2017 1115   ALKPHOS 69 08/07/2016 1231   BILITOT 0.3 08/06/2017 1115   BILITOT 0.51 08/07/2016 1231   GFRNONAA >60 08/06/2017 1115   GFRAA >60 08/06/2017 1115    I No results found  for: SPEP  Lab Results  Component Value Date   WBC 6.3 08/06/2017   NEUTROABS 3.3 08/06/2017   HGB 12.8 08/06/2017   HCT 38.5 08/06/2017   MCV 95.6 08/06/2017   PLT 212 08/06/2017      Chemistry  Component Value Date/Time   NA 141 08/06/2017 1115   NA 140 08/30/2016 0956   NA 141 08/07/2016 1231   K 4.2 08/06/2017 1115   K 4.3 08/07/2016 1231   CL 106 08/06/2017 1115   CO2 25 08/06/2017 1115   CO2 27 08/07/2016 1231   BUN 12 08/06/2017 1115   BUN 12 08/30/2016 0956   BUN 10.9 08/07/2016 1231   CREATININE 0.81 08/06/2017 1115   CREATININE 0.8 08/07/2016 1231      Component Value Date/Time   CALCIUM 9.8 08/06/2017 1115   CALCIUM 10.0 08/07/2016 1231   ALKPHOS 37 (L) 08/06/2017 1115   ALKPHOS 69 08/07/2016 1231   AST 32 08/06/2017 1115   AST 129 (H) 08/07/2016 1231   ALT 29 08/06/2017 1115   ALT 91 (H) 08/07/2016 1231   BILITOT 0.3 08/06/2017 1115   BILITOT 0.51 08/07/2016 1231       No results found for: LABCA2  No components found for: LABCA125  No results for input(s): INR in the last 168 hours.  Urinalysis No results found for: COLORURINE  STUDIES: Mm 3d Screen Breast Uni Left  Result Date: 07/24/2017 CLINICAL DATA:  Screening. EXAM: DIGITAL SCREENING UNILATERAL LEFT MAMMOGRAM WITH CAD AND TOMO COMPARISON:  Previous exam(s). ACR Breast Density Category c: The breast tissue is heterogeneously dense, which may obscure small masses. FINDINGS: The patient has had a right mastectomy. There are no findings suspicious for malignancy. Images were processed with CAD. IMPRESSION: No mammographic evidence of malignancy. A result letter of this screening mammogram will be mailed directly to the patient. RECOMMENDATION: Screening mammogram in one year.  (Code:SM-L-2M) BI-RADS CATEGORY  1: Negative. Electronically Signed   By: Ammie Ferrier M.D.   On: 07/24/2017 13:24    ASSESSMENT: 72 y.o. [place] woman status post right breast biopsy 06/02/2013 for a  clinical T2 N0, stage IIA invasive lobular breast cancer, grade 2, estrogen receptor and progesterone receptor positive, with an MIB-1 of 80%, HER-2 obtained from definitive surgery  (1) status post right mastectomy and sentinel lymph node sampling 07/04/2013 for a pT3, pN0, stage IIB invasive lobular carcinoma, grade 2, HER-2 negative  (2) Oncotype score of 6 predicts a risk of outside the breast recurrence within 10 years and 5% of the patient's only systemic treatment is tamoxifen for 5 years. It also predicts no benefit from chemotherapy  (3) tamoxifen started 09/15/2013  (4) underwent TRAM flap reconstruction 12/30/2013  PLAN: [place] now 4 years out from definitive surgery for breast cancer with no evidence of disease recurrence.  This is very favorable.  She continues on tamoxifen, with excellent tolerance.  She has 1 more year to go before she completes a 5 years at which point she will be able to "graduate".  I commended her exercise program and I agreed with her that a diet less rich in carbohydrates will be helpful to her as far as her atherosclerosis and hepatic steatosis is concerned  She knows to call for any other issues that may develop before the next visit.  Magrinat, Virgie Dad, MD  08/06/17 12:16 PM Medical Oncology and Hematology [facility] [address] [place], [place] [address] Tel. [phone]    Fax. [phone]  Alice Rieger, am acting as scribe for Chauncey Cruel MD.  I, Lurline Del MD, have reviewed the above documentation for accuracy and completeness, and I agree with the above.

## 2017-08-06 ENCOUNTER — Inpatient Hospital Stay: Payer: Medicare Other

## 2017-08-06 ENCOUNTER — Telehealth: Payer: Self-pay | Admitting: Oncology

## 2017-08-06 ENCOUNTER — Inpatient Hospital Stay: Payer: Medicare Other | Attending: Oncology | Admitting: Oncology

## 2017-08-06 VITALS — BP 143/70 | HR 62 | Temp 97.7°F | Resp 18 | Ht 59.0 in | Wt 133.9 lb

## 2017-08-06 DIAGNOSIS — Z803 Family history of malignant neoplasm of breast: Secondary | ICD-10-CM | POA: Insufficient documentation

## 2017-08-06 DIAGNOSIS — Z79899 Other long term (current) drug therapy: Secondary | ICD-10-CM | POA: Diagnosis not present

## 2017-08-06 DIAGNOSIS — C50411 Malignant neoplasm of upper-outer quadrant of right female breast: Secondary | ICD-10-CM | POA: Insufficient documentation

## 2017-08-06 DIAGNOSIS — I700 Atherosclerosis of aorta: Secondary | ICD-10-CM | POA: Diagnosis not present

## 2017-08-06 DIAGNOSIS — M818 Other osteoporosis without current pathological fracture: Secondary | ICD-10-CM

## 2017-08-06 DIAGNOSIS — Z79810 Long term (current) use of selective estrogen receptor modulators (SERMs): Secondary | ICD-10-CM | POA: Diagnosis not present

## 2017-08-06 DIAGNOSIS — I10 Essential (primary) hypertension: Secondary | ICD-10-CM

## 2017-08-06 DIAGNOSIS — R1031 Right lower quadrant pain: Secondary | ICD-10-CM | POA: Diagnosis not present

## 2017-08-06 DIAGNOSIS — I2510 Atherosclerotic heart disease of native coronary artery without angina pectoris: Secondary | ICD-10-CM

## 2017-08-06 DIAGNOSIS — Z7982 Long term (current) use of aspirin: Secondary | ICD-10-CM | POA: Diagnosis not present

## 2017-08-06 DIAGNOSIS — R252 Cramp and spasm: Secondary | ICD-10-CM | POA: Diagnosis not present

## 2017-08-06 DIAGNOSIS — M810 Age-related osteoporosis without current pathological fracture: Secondary | ICD-10-CM | POA: Insufficient documentation

## 2017-08-06 DIAGNOSIS — K219 Gastro-esophageal reflux disease without esophagitis: Secondary | ICD-10-CM | POA: Insufficient documentation

## 2017-08-06 DIAGNOSIS — Z170 Estrogen receptor positive status [ER+]: Secondary | ICD-10-CM | POA: Diagnosis not present

## 2017-08-06 DIAGNOSIS — F419 Anxiety disorder, unspecified: Secondary | ICD-10-CM

## 2017-08-06 DIAGNOSIS — E7800 Pure hypercholesterolemia, unspecified: Secondary | ICD-10-CM | POA: Diagnosis not present

## 2017-08-06 DIAGNOSIS — K760 Fatty (change of) liver, not elsewhere classified: Secondary | ICD-10-CM | POA: Insufficient documentation

## 2017-08-06 LAB — CMP (CANCER CENTER ONLY)
ALBUMIN: 4.2 g/dL (ref 3.5–5.0)
ALK PHOS: 37 U/L — AB (ref 40–150)
ALT: 29 U/L (ref 0–55)
AST: 32 U/L (ref 5–34)
Anion gap: 10 (ref 3–11)
BILIRUBIN TOTAL: 0.3 mg/dL (ref 0.2–1.2)
BUN: 12 mg/dL (ref 7–26)
CALCIUM: 9.8 mg/dL (ref 8.4–10.4)
CO2: 25 mmol/L (ref 22–29)
CREATININE: 0.81 mg/dL (ref 0.60–1.10)
Chloride: 106 mmol/L (ref 98–109)
GFR, Est AFR Am: >60 mL/min (ref >60)
GFR, Estimated: >60 mL/min (ref >60)
GLUCOSE: 97 mg/dL (ref 70–140)
Potassium: 4.2 mmol/L (ref 3.5–5.1)
Sodium: 141 mmol/L (ref 136–145)
TOTAL PROTEIN: 7.5 g/dL (ref 6.4–8.3)

## 2017-08-06 LAB — CBC WITH DIFFERENTIAL (CANCER CENTER ONLY)
BASOS ABS: 0.1 10*3/uL (ref 0.0–0.1)
BASOS PCT: 1 %
EOS ABS: 0.1 10*3/uL (ref 0.0–0.5)
EOS PCT: 2 %
HCT: 38.5 % (ref 34.8–46.6)
Hemoglobin: 12.8 g/dL (ref 11.6–15.9)
Lymphocytes Relative: 36 %
Lymphs Abs: 2.3 10*3/uL (ref 0.9–3.3)
MCH: 31.8 pg (ref 25.1–34.0)
MCHC: 33.2 g/dL (ref 31.5–36.0)
MCV: 95.6 fL (ref 79.5–101.0)
MONO ABS: 0.6 10*3/uL (ref 0.1–0.9)
MONOS PCT: 9 %
Neutro Abs: 3.3 10*3/uL (ref 1.5–6.5)
Neutrophils Relative %: 52 %
PLATELETS: 212 10*3/uL (ref 145–400)
RBC: 4.03 MIL/uL (ref 3.70–5.45)
RDW: 13.1 % (ref 11.2–14.5)
WBC Count: 6.3 10*3/uL (ref 3.9–10.3)

## 2017-08-06 MED ORDER — TAMOXIFEN CITRATE 20 MG PO TABS: 20.0000 mg | ORAL_TABLET | Freq: Every day | ORAL | 3 refills | Status: DC

## 2017-08-06 NOTE — Telephone Encounter (Signed)
Gave patient avs and calendar of upcoming June 2020 appointments.  °

## 2017-09-14 ENCOUNTER — Encounter: Payer: Self-pay | Admitting: Pediatrics

## 2017-09-14 ENCOUNTER — Ambulatory Visit (INDEPENDENT_AMBULATORY_CARE_PROVIDER_SITE_OTHER): Payer: Medicare Other | Admitting: Pediatrics

## 2017-09-14 VITALS — BP 133/77 | HR 88 | Temp 97.5°F | Ht 59.0 in | Wt 134.4 lb

## 2017-09-14 DIAGNOSIS — I6521 Occlusion and stenosis of right carotid artery: Secondary | ICD-10-CM

## 2017-09-14 DIAGNOSIS — B029 Zoster without complications: Secondary | ICD-10-CM | POA: Diagnosis not present

## 2017-09-14 DIAGNOSIS — L299 Pruritus, unspecified: Secondary | ICD-10-CM

## 2017-09-14 MED ORDER — TRIAMCINOLONE ACETONIDE 0.025 % EX OINT: 1.0000 "application " | TOPICAL_OINTMENT | Freq: Two times a day (BID) | CUTANEOUS | 0 refills | Status: DC

## 2017-09-14 MED ORDER — VALACYCLOVIR HCL 1 G PO TABS
1000.0000 mg | ORAL_TABLET | Freq: Three times a day (TID) | ORAL | 0 refills | Status: DC
Start: 2017-09-14 — End: 2018-02-11

## 2017-09-14 NOTE — Progress Notes (Signed)
  Subjective:   Patient ID: Penny Howard, female    DOB: 1945/12/07, 72 y.o.   MRN: [id number] CC: Rash (lower back)  HPI: Penny Howard is a 72 y.o. female   Started 3 days ago, small amount of rash on her left flank.  Is very itchy.  Started as red bumps. Has gotten worse, larger since then.  Skin feels very sensitive over area of the rash.  No blisters that she knows of, hard place for her to see.  She fell on her left side in a creek about a week ago. Doesn't think she was around any plants or poison ivy at the time. No one else in the family with rash.  No fevers, otherwise feeling well. Has had shingles before in her scalp.  Relevant past medical, surgical, family and social history reviewed. Allergies and medications reviewed and updated. Social History   Tobacco Use  Smoking Status Never Smoker  Smokeless Tobacco Never Used   ROS: Per HPI   Objective:    BP 133/77   Pulse 88   Temp (!) 97.5 F (36.4 C) (Oral)   Ht 4' 11"  (1.499 m)   Wt 134 lb 6.4 oz (61 kg)   BMI 27.15 kg/m   Wt Readings from Last 3 Encounters:  09/14/17 134 lb 6.4 oz (61 kg)  08/06/17 133 lb 14.4 oz (60.7 kg)  04/30/17 131 lb (59.4 kg)    Gen: NAD, alert, cooperative with exam, NCAT EYES: EOMI, no conjunctival injection, or no icterus CV: NRRR, normal S1/S2, no murmur, distal pulses 2+ b/l Resp: CTABL, no wheezes, normal WOB Abd: +BS, soft, NTND Ext: No edema, warm Neuro: Alert and oriented Skin: L flank with scattered red papules coalescing into several CM red plaque,  "sensitive" to touch  Assessment & Plan:  Penny Howard was seen today for rash. Treat with below, suspect shingles. Will give steroid cream in case contact dermatitis, she has 72th birthday celebration tonight and asking for relief.  Diagnoses and all orders for this visit:  Herpes zoster without complication -     valACYclovir (VALTREX) 1000 MG tablet; Take 1 tablet (1,000 mg total) by mouth 3 (three) times  daily.  Itching -     triamcinolone (KENALOG) 0.025 % ointment; Apply 1 application topically 2 (two) times daily.   Follow up plan: Return if symptoms worsen or fail to improve. Assunta Found, MD [place]

## 2017-10-02 ENCOUNTER — Ambulatory Visit: Payer: Medicare Other

## 2017-11-20 ENCOUNTER — Other Ambulatory Visit: Payer: Self-pay | Admitting: Pediatrics

## 2017-11-20 DIAGNOSIS — K219 Gastro-esophageal reflux disease without esophagitis: Secondary | ICD-10-CM

## 2018-01-14 ENCOUNTER — Other Ambulatory Visit: Payer: Self-pay | Admitting: Pediatrics

## 2018-01-14 DIAGNOSIS — I10 Essential (primary) hypertension: Secondary | ICD-10-CM

## 2018-01-19 ENCOUNTER — Other Ambulatory Visit: Payer: Self-pay | Admitting: Pediatrics

## 2018-01-19 DIAGNOSIS — K219 Gastro-esophageal reflux disease without esophagitis: Secondary | ICD-10-CM

## 2018-02-08 ENCOUNTER — Telehealth: Payer: Self-pay

## 2018-02-08 ENCOUNTER — Ambulatory Visit (INDEPENDENT_AMBULATORY_CARE_PROVIDER_SITE_OTHER): Payer: Medicare Other

## 2018-02-08 ENCOUNTER — Encounter: Payer: Self-pay | Admitting: Pediatrics

## 2018-02-08 ENCOUNTER — Ambulatory Visit (INDEPENDENT_AMBULATORY_CARE_PROVIDER_SITE_OTHER): Payer: Medicare Other | Admitting: Pediatrics

## 2018-02-08 VITALS — BP 133/82 | HR 74 | Temp 97.2°F | Ht 59.0 in | Wt 137.2 lb

## 2018-02-08 DIAGNOSIS — I10 Essential (primary) hypertension: Secondary | ICD-10-CM | POA: Diagnosis not present

## 2018-02-08 DIAGNOSIS — Z780 Asymptomatic menopausal state: Secondary | ICD-10-CM | POA: Diagnosis not present

## 2018-02-08 DIAGNOSIS — E785 Hyperlipidemia, unspecified: Secondary | ICD-10-CM

## 2018-02-08 DIAGNOSIS — M8588 Other specified disorders of bone density and structure, other site: Secondary | ICD-10-CM | POA: Diagnosis not present

## 2018-02-08 DIAGNOSIS — F411 Generalized anxiety disorder: Secondary | ICD-10-CM

## 2018-02-08 DIAGNOSIS — Z23 Encounter for immunization: Secondary | ICD-10-CM | POA: Diagnosis not present

## 2018-02-08 DIAGNOSIS — I6521 Occlusion and stenosis of right carotid artery: Secondary | ICD-10-CM | POA: Diagnosis not present

## 2018-02-08 DIAGNOSIS — K5900 Constipation, unspecified: Secondary | ICD-10-CM | POA: Diagnosis not present

## 2018-02-08 DIAGNOSIS — M8580 Other specified disorders of bone density and structure, unspecified site: Secondary | ICD-10-CM | POA: Diagnosis not present

## 2018-02-08 DIAGNOSIS — K219 Gastro-esophageal reflux disease without esophagitis: Secondary | ICD-10-CM | POA: Diagnosis not present

## 2018-02-08 MED ORDER — ESCITALOPRAM OXALATE 10 MG PO TABS: 10.0000 mg | ORAL_TABLET | Freq: Every day | ORAL | 3 refills | Status: DC

## 2018-02-08 MED ORDER — LINACLOTIDE 72 MCG PO CAPS: 72.0000 ug | ORAL_CAPSULE | Freq: Every day | ORAL | 5 refills | Status: DC

## 2018-02-08 MED ORDER — LISINOPRIL 10 MG PO TABS: 10.0000 mg | ORAL_TABLET | Freq: Every day | ORAL | 3 refills | Status: DC

## 2018-02-08 MED ORDER — LISINOPRIL 10 MG PO TABS
10.0000 mg | ORAL_TABLET | Freq: Every day | ORAL | 3 refills | Status: DC
Start: 2018-02-08 — End: 2018-07-18

## 2018-02-08 MED ORDER — ESOMEPRAZOLE MAGNESIUM 20 MG PO CPDR: DELAYED_RELEASE_CAPSULE | ORAL | 2 refills | Status: DC

## 2018-02-08 MED ORDER — PRAVASTATIN SODIUM 20 MG PO TABS
20.0000 mg | ORAL_TABLET | Freq: Every day | ORAL | 3 refills | Status: DC
Start: 2018-02-08 — End: 2018-07-18

## 2018-02-08 NOTE — Progress Notes (Signed)
Subjective:   Patient ID: Penny Howard, female    DOB: May 16, 1945, 72 y.o.   MRN: [id number] CC: Follow-up multiple medical problems HPI: Penny Howard is a 72 y.o. female   Overall has been feeling well since last visit.  Is been watching more weight she is eating, remains active, walking regularly.  Osteopenia: Was started on risedronate in 2014.  Has been taking since then.  Due for repeat DEXA, bisphosphonate 1 year holiday  Hyperlipidemia: Taking pravastatin regularly.  Carotid artery stenosis: Present right side.  Howard after patient persistently hearing loud heartbeat right ear.  Last ultrasound 1 year ago.  No history of CVA.  Family history of artery disease.  On statin and baby aspirin.  Hypertension: Taking medicine regularly.  Constipation: Ongoing symptoms.  Took Linzess following last visit until samples ran out.  Caused loose stools.  Anxiety: Symptoms been well controlled on Lexapro.  Taking daily.  Relevant past medical, surgical, family and social history reviewed. Allergies and medications reviewed and updated. Social History   Tobacco Use  Smoking Status Never Smoker  Smokeless Tobacco Never Used   ROS: Per HPI   Objective:    BP 133/82   Pulse 74   Temp (!) 97.2 F (36.2 C) (Oral)   Ht 4' 11"  (1.499 m)   Wt 137 lb 3.2 oz (62.2 kg)   BMI 27.71 kg/m   Wt Readings from Last 3 Encounters:  02/08/18 137 lb 3.2 oz (62.2 kg)  09/14/17 134 lb 6.4 oz (61 kg)  08/06/17 133 lb 14.4 oz (60.7 kg)    Gen: NAD, alert, cooperative with exam, NCAT EYES: EOMI, no conjunctival injection, or no icterus ENT:  TMs pearly gray b/l, OP without erythema LYMPH: no cervical LAD CV: NRRR, normal S1/S2, no murmur, distal pulses 2+ b/l, soft bruit right-sid of neck Resp: CTABL, no wheezes, normal WOB Abd: +BS, soft, NTND.  Ext: No edema, warm Neuro: Alert and oriented, strength equal b/l UE and LE, coordination grossly normal MSK: normal muscle bulk  Assessment  & Plan:  Penny Howard was seen today for follow-up multiple medical problems.  Diagnoses and all orders for this visit:  Constipation, unspecified constipation type We will try below.  We will send him to see if covered by insurance.  Samples of below given. -     linaclotide (LINZESS) 72 MCG capsule; Take 1 capsule (72 mcg total) by mouth daily before breakfast.  Osteopenia Was started on treatment with bisphosphonate in 2014.  Last DEXA scan 2014.  Will get DEXA scan today.  Due for bisphosphonate holiday, will hold risedronate for the next year.  Then repeat DEXA scan to consider restart of bisphosphonate.  History of breast cancer, still on tamoxifen.  Hyperlipidemia, unspecified hyperlipidemia type Stable, continue below -     pravastatin (PRAVACHOL) 20 MG tablet; Take 1 tablet (20 mg total) by mouth daily.  Benign essential HTN Stable, continue below -     lisinopril (PRINIVIL,ZESTRIL) 10 MG tablet; Take 1 tablet (10 mg total) by mouth daily. (Needs to be seen before next refill)  Gastroesophageal reflux disease, esophagitis presence not specified Stable, continue below -     esomeprazole (NEXIUM) 20 MG capsule; TAKE 1 CAPSULE DAILY AT    NOON  Anxiety state Stable, continue below -     escitalopram (LEXAPRO) 10 MG tablet; Take 1 tablet (10 mg total) by mouth daily.  Post-menopausal -     DG [facility] DEXA  Stenosis of right carotid artery 50  to 69% stenosis right side at last ultrasound over a year ago.  Due for repeat.  No history of stroke or heart attack. -     US Carotid Duplex Bilateral; Future  Encounter for immunization -     Flu vaccine HIGH DOSE PF   Follow up plan: Return in about 6 months (around 08/10/2018). Penny Found, MD [place]

## 2018-02-08 NOTE — Telephone Encounter (Signed)
Patient aware.

## 2018-02-08 NOTE — Telephone Encounter (Signed)
She has samples she can try. Take them daily. Increase miralax as well. If miralax not working may be able to get approved.

## 2018-02-08 NOTE — Telephone Encounter (Signed)
Insurance denied Linzess

## 2018-02-14 ENCOUNTER — Ambulatory Visit (HOSPITAL_COMMUNITY): Payer: Medicare Other | Attending: Pediatrics

## 2018-07-09 ENCOUNTER — Other Ambulatory Visit: Payer: Self-pay | Admitting: Oncology

## 2018-07-09 DIAGNOSIS — Z1231 Encounter for screening mammogram for malignant neoplasm of breast: Secondary | ICD-10-CM

## 2018-07-18 ENCOUNTER — Ambulatory Visit (INDEPENDENT_AMBULATORY_CARE_PROVIDER_SITE_OTHER): Payer: Medicare Other | Admitting: Family Medicine

## 2018-07-18 ENCOUNTER — Other Ambulatory Visit: Payer: Self-pay

## 2018-07-18 ENCOUNTER — Encounter: Payer: Self-pay | Admitting: Family Medicine

## 2018-07-18 VITALS — BP 138/83 | HR 71 | Temp 98.1°F | Ht 59.0 in | Wt 129.0 lb

## 2018-07-18 DIAGNOSIS — R0989 Other specified symptoms and signs involving the circulatory and respiratory systems: Secondary | ICD-10-CM | POA: Insufficient documentation

## 2018-07-18 DIAGNOSIS — I10 Essential (primary) hypertension: Secondary | ICD-10-CM | POA: Diagnosis not present

## 2018-07-18 DIAGNOSIS — Z170 Estrogen receptor positive status [ER+]: Secondary | ICD-10-CM

## 2018-07-18 DIAGNOSIS — K2100 Gastro-esophageal reflux disease with esophagitis, without bleeding: Secondary | ICD-10-CM

## 2018-07-18 DIAGNOSIS — C50411 Malignant neoplasm of upper-outer quadrant of right female breast: Secondary | ICD-10-CM

## 2018-07-18 DIAGNOSIS — M8589 Other specified disorders of bone density and structure, multiple sites: Secondary | ICD-10-CM

## 2018-07-18 DIAGNOSIS — I6521 Occlusion and stenosis of right carotid artery: Secondary | ICD-10-CM

## 2018-07-18 DIAGNOSIS — F411 Generalized anxiety disorder: Secondary | ICD-10-CM

## 2018-07-18 DIAGNOSIS — E782 Mixed hyperlipidemia: Secondary | ICD-10-CM

## 2018-07-18 HISTORY — DX: Other specified symptoms and signs involving the circulatory and respiratory systems: R09.89

## 2018-07-18 MED ORDER — ESCITALOPRAM OXALATE 10 MG PO TABS: 10.0000 mg | ORAL_TABLET | Freq: Every day | ORAL | 3 refills | Status: DC

## 2018-07-18 MED ORDER — LISINOPRIL 10 MG PO TABS: 10.0000 mg | ORAL_TABLET | Freq: Every day | ORAL | 3 refills | Status: DC

## 2018-07-18 MED ORDER — PRAVASTATIN SODIUM 20 MG PO TABS: 20.0000 mg | ORAL_TABLET | Freq: Every day | ORAL | 3 refills | Status: DC

## 2018-07-18 MED ORDER — ESOMEPRAZOLE MAGNESIUM 20 MG PO CPDR: DELAYED_RELEASE_CAPSULE | ORAL | 3 refills | Status: DC

## 2018-07-18 NOTE — Progress Notes (Signed)
Subjective: CC: f/u HTN, HLD, Anxiety PCP: [name], DO [id number] T Penny Howard is a 73 y.o. female presenting to clinic today for:  1. Hypertension/ Hyperlipidemia/ NASH Patient reports compliance with lisinopril, pravastatin.  No chest pain, shortness of breath or lower extremity edema.  She tries to be physically active and is actively working on weight loss.  She has been cutting carbohydrates back and has subsequently lost a couple of pound since her last visit.  She walks regularly and goes to the pool.  She does have a history of nonalcoholic fatty liver disease.  She also has a history of carotid artery stenosis on the right and was supposed to have a repeat ultrasound in December but was unaware of the appointment as it was scheduled 02/14/2018.  She would like to reschedule this.  2.  Breast cancer Patient with history of breast cancer on the right.  She has history of mastectomy on this side and is currently treated with tamoxifen.  She has follow-up with Dr. Jana Hakim her oncologist next month.  She is 5 years out from her diagnosis and is hopeful that she will be able to come off of the tamoxifen soon.  She denies any side effects from the medication and overall feels well.  Surgical history also significant for reconstructive surgery of the right breast.  3.  GERD Patient with history of acid reflux that is well controlled with her PPI.  No nausea, vomiting.  She is eating well.  ROS: Per HPI  Allergies  Allergen Reactions  . Codeine Nausea Only   Past Medical History:  Diagnosis Date  . Allergy   . Anxiety   . Cancer ([place])    breast rt  . GERD (gastroesophageal reflux disease)   . High cholesterol   . Hypertension   . Liver fibrosis   . Osteoporosis   . Squamous cell carcinoma   . Stress incontinence   . Wears contact lenses     Current Outpatient Medications:  .  aspirin EC 81 MG tablet, Take 81 mg by mouth daily., Disp: , Rfl:  .  escitalopram  (LEXAPRO) 10 MG tablet, Take 1 tablet (10 mg total) by mouth daily., Disp: 90 tablet, Rfl: 3 .  esomeprazole (NEXIUM) 20 MG capsule, TAKE 1 CAPSULE DAILY AT    NOON, Disp: 90 capsule, Rfl: 2 .  linaclotide (LINZESS) 72 MCG capsule, Take 1 capsule (72 mcg total) by mouth daily before breakfast., Disp: 30 capsule, Rfl: 5 .  lisinopril (PRINIVIL,ZESTRIL) 10 MG tablet, Take 1 tablet (10 mg total) by mouth daily. (Needs to be seen before next refill), Disp: 90 tablet, Rfl: 3 .  multivitamin-lutein (OCUVITE-LUTEIN) CAPS capsule, Take 1 capsule by mouth daily. 250 mg capsule, Disp: , Rfl:  .  pravastatin (PRAVACHOL) 20 MG tablet, Take 1 tablet (20 mg total) by mouth daily., Disp: 90 tablet, Rfl: 3 .  tamoxifen (NOLVADEX) 20 MG tablet, Take 1 tablet (20 mg total) by mouth daily., Disp: 90 tablet, Rfl: 3 .  triamcinolone (KENALOG) 0.025 % ointment, Apply 1 application topically 2 (two) times daily., Disp: 30 g, Rfl: 0 .  vitamin E (VITAMIN E) 400 UNIT capsule, Take 400 Units by mouth daily., Disp: , Rfl:  Social History   Socioeconomic History  . Marital status: Married    Spouse name: Not on file  . Number of children: 2  . Years of education: Not on file  . Highest education level: Not on file  Occupational History  .  Occupation: retired  Scientific laboratory technician  . Financial resource strain: Not on file  . Food insecurity:    Worry: Not on file    Inability: Not on file  . Transportation needs:    Medical: Not on file    Non-medical: Not on file  Tobacco Use  . Smoking status: Never Smoker  . Smokeless tobacco: Never Used  Substance and Sexual Activity  . Alcohol use: Yes    Alcohol/week: 5.0 standard drinks    Types: 5 Glasses of wine per week    Comment: occaionally  . Drug use: No  . Sexual activity: Yes  Lifestyle  . Physical activity:    Days per week: Not on file    Minutes per session: Not on file  . Stress: Not on file  Relationships  . Social connections:    Talks on phone: Not on  file    Gets together: Not on file    Attends religious service: Not on file    Active member of club or organization: Not on file    Attends meetings of clubs or organizations: Not on file    Relationship status: Not on file  . Intimate partner violence:    Fear of current or ex partner: Not on file    Emotionally abused: Not on file    Physically abused: Not on file    Forced sexual activity: Not on file  Other Topics Concern  . Not on file  Social History Narrative  . Not on file   Family History  Problem Relation Age of Onset  . Breast cancer Mother   . Heart failure Father   . Breast cancer Paternal Aunt   . Lymphoma Cousin        non- hodgkins lymphoma  . Colon cancer Neg Hx   . Rectal cancer Neg Hx   . Esophageal cancer Neg Hx   . Stomach cancer Neg Hx   . Liver cancer Neg Hx     Objective: Office vital signs reviewed. BP 138/83   Pulse 71   Temp 98.1 F (36.7 C) (Oral)   Ht 4' 11"  (1.499 m)   Wt 129 lb (58.5 kg)   BMI 26.05 kg/m   Physical Examination:  General: Awake, alert, well nourished, No acute distress HEENT: Normal, sclera white, MMM; carotid bruit appreciated on the right Cardio: regular rate and rhythm, S1S2 heard, no murmurs appreciated Pulm: clear to auscultation bilaterally, no wheezes, rhonchi or rales; normal work of breathing on room air Extremities: warm, well perfused, No edema, cyanosis or clubbing; +2 pulses bilaterally MSK: normal gait and station Skin: dry; intact; no rashes.  Several areas of hyperpigmentation and multiple pigmented nevi noted along her neck, chest and upper extremities.  She has a seborrheic keratosis appreciated on the right axilla anteriorly. Psych: Mood stable, speech normal, affect appropriate, pleasant and interactive Depression screen [facility] 2/9 07/18/2018 02/08/2018 09/14/2017  Decreased Interest 0 0 0  Down, Depressed, Hopeless 0 0 0  PHQ - 2 Score 0 0 0  Altered sleeping 0 - -  Tired, decreased energy 0 - -   Change in appetite 0 - -  Feeling bad or failure about yourself  0 - -  Trouble concentrating 0 - -  Moving slowly or fidgety/restless 0 - -  Suicidal thoughts 0 - -  PHQ-9 Score 0 - -    Assessment/ Plan: 73 y.o. female   1. Benign essential hypertension Controlled.  Continue current regimen.  Lisinopril refilled,  check CMP along with her labs when she sees her oncologist - CMP14+EGFR; Future - lisinopril (ZESTRIL) 10 MG tablet; Take 1 tablet (10 mg total) by mouth daily.  Dispense: 90 tablet; Refill: 3  2. Mixed hyperlipidemia Refill of Pravachol sent - CMP14+EGFR; Future - Lipid Panel; Future - TSH; Future - pravastatin (PRAVACHOL) 20 MG tablet; Take 1 tablet (20 mg total) by mouth daily.  Dispense: 90 tablet; Refill: 3  3. Bruit of right carotid artery Plan to reschedule ultrasound of carotid artery.  Bruit was appreciated on exam - US Carotid Duplex Bilateral; Future  4. Carotid stenosis, asymptomatic, right - US Carotid Duplex Bilateral; Future  5. Malignant neoplasm of upper-outer quadrant of right breast in female, estrogen receptor positive ([place]) Follow-up with oncology scheduled  6. Gastro-esophageal reflux disease with esophagitis Controlled.  Refill PPI - esomeprazole (NEXIUM) 20 MG capsule; TAKE 1 CAPSULE DAILY AT    NOON  Dispense: 90 capsule; Refill: 3  7. Age-related osteoporosis without current pathological fracture Last DEXA scan actually showed T score of -2.1 which would technically classify patient as osteopenic and therefore I have updated her record to reflect this finding.  No DEXA due until 2021 - CMP14+EGFR; Future - VITAMIN D 25 Hydroxy (Vit-D Deficiency, Fractures); Future  8. Anxiety state Stable.  Lexapro refilled - escitalopram (LEXAPRO) 10 MG tablet; Take 1 tablet (10 mg total) by mouth daily.  Dispense: 90 tablet; Refill: 3   No orders of the defined types were placed in this encounter.  No orders of the defined types were placed in  this encounter.    [name], DO [place] [phone]

## 2018-07-22 ENCOUNTER — Ambulatory Visit (INDEPENDENT_AMBULATORY_CARE_PROVIDER_SITE_OTHER): Payer: Medicare Other | Admitting: *Deleted

## 2018-07-22 ENCOUNTER — Encounter: Payer: Self-pay | Admitting: *Deleted

## 2018-07-22 ENCOUNTER — Other Ambulatory Visit: Payer: Self-pay

## 2018-07-22 DIAGNOSIS — Z0000 Encounter for general adult medical examination without abnormal findings: Secondary | ICD-10-CM | POA: Diagnosis not present

## 2018-07-22 NOTE — Progress Notes (Signed)
MEDICARE ANNUAL WELLNESS VISIT  07/22/2018  Telephone Visit Disclaimer This Medicare AWV was conducted by telephone due to national recommendations for restrictions regarding the COVID-19 Pandemic (e.g. social distancing).  I verified, using two identifiers, that I am speaking with Penny Howard or their authorized healthcare agent. I discussed the limitations, risks, security, and privacy concerns of performing an evaluation and management service by telephone and the potential availability of an in-person appointment in the future. The patient expressed understanding and agreed to proceed.   Subjective:  Penny Howard is a 73 y.o. female patient of [name], DO who had a Medicare Annual Wellness Visit today via telephone. 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  She lives with her husband. They have 2 children and 1 grandchild. She reports that she is socially active and does interact with friends/family regularly. She is moderately physically active and enjoys traveling, doing yoga and zumba.  Patient Care Team: [name], DO as PCP - General (Family Medicine) Juanita Craver, MD as Consulting Physician (Gastroenterology) Michael Boston, MD as Consulting Physician (General Surgery) Mauri Pole, MD as Consulting Physician (Gastroenterology) Magrinat, Virgie Dad, MD as Consulting Physician (Oncology) Irene Limbo, MD as Consulting Physician (Plastic Surgery)  Advanced Directives 07/22/2018 07/27/2015 07/14/2014 04/24/2014 04/16/2014 12/31/2013 12/23/2013  Does Patient Have a Medical Advance Directive? No No No - No No No  Would patient like information on creating a medical advance directive? Yes (MAU/Ambulatory/Procedural Areas - Information given) - - No - patient declined information Yes - Educational materials given No - patient declined information No - patient declined information  Pre-existing out of facility DNR order (yellow form  or pink MOST form) - - - - - - -    Hospital Utilization Over the Past 12 Months: # of hospitalizations or ER visits: 0 # of surgeries: 0  Review of Systems    Patient reports that her overall health is better compared to last year because she has increased energy    Review of Systems:   All systems negative as reported by patient.  Pain Assessment Pain Score: 0-No pain     Current Medications & Allergies (verified) Allergies as of 07/22/2018      Reactions   Codeine Nausea Only      Medication List       Accurate as of July 22, 2018 11:57 AM. If you have any questions, ask your nurse or doctor.        aspirin EC 81 MG tablet Take 81 mg by mouth daily.   escitalopram 10 MG tablet Commonly known as:  LEXAPRO Take 1 tablet (10 mg total) by mouth daily.   esomeprazole 20 MG capsule Commonly known as:  NEXIUM TAKE 1 CAPSULE DAILY AT    NOON   lisinopril 10 MG tablet Commonly known as:  ZESTRIL Take 1 tablet (10 mg total) by mouth daily.   multivitamin-lutein Caps capsule Take 1 capsule by mouth daily. 250 mg capsule   pravastatin 20 MG tablet Commonly known as:  PRAVACHOL Take 1 tablet (20 mg total) by mouth daily.   tamoxifen 20 MG tablet Commonly known as:  NOLVADEX Take 1 tablet (20 mg total) by mouth daily.   triamcinolone 0.025 % ointment Commonly known as:  KENALOG Apply 1 application topically 2 (two) times daily.   vitamin E 400 UNIT capsule Generic drug:  vitamin E Take 400 Units by mouth daily.       History (  reviewed): Past Medical History:  Diagnosis Date  . Abnormal glucose tolerance test 05/02/2011  . Allergy   . Anxiety   . Bruit of right carotid artery 07/18/2018  . Cancer ([place])    breast rt  . GERD (gastroesophageal reflux disease)   . High cholesterol   . Hypertension   . Liver fibrosis   . Osteoporosis   . Other malaise and fatigue 02/02/2010  . Squamous cell carcinoma   . Stress incontinence   . Unspecified urinary  incontinence 11/03/2004  . Wears contact lenses    Past Surgical History:  Procedure Laterality Date  . ABDOMINAL HYSTERECTOMY  1990   ovaries intact b/l  . BLADDER SURGERY  10   mesh  . COLONOSCOPY    . LIPOSUCTION WITH LIPOFILLING Right 04/24/2014   Procedure: LIPOFILLING TO RIGHT CHEST/RIGHT NIPPLE AREOLA COMPLEX CREATION WITH FULL THICKNESS SKIN GRAFT/REVISION OF RIGHT TRANSFLAP;  Surgeon: Irene Limbo, MD;  Location: [facility];  Service: Plastics;  Laterality: Right;  . MASTECTOMY Right 07/04/2013  . MASTECTOMY W/ SENTINEL NODE BIOPSY  07/04/2013   RT TOTAL            DR [place]  . SCAR REVISION Right 04/24/2014   Procedure: SCAR REVISION;  Surgeon: Irene Limbo, MD;  Location: [facility];  Service: Plastics;  Laterality: Right;  . SIMPLE MASTECTOMY WITH AXILLARY SENTINEL NODE BIOPSY Right 07/04/2013   Procedure: RIGHT TOTAL MASTECTOMY WITH RIGHT  AXILLARY SENTINEL LYMPH NODE BIOPSY;  Surgeon: Edward Jolly, MD;  Location: [facility];  Service: General;  Laterality: Right;  . SKIN FULL THICKNESS GRAFT Right 04/24/2014   Procedure: SKIN GRAFT FULL THICKNESS;  Surgeon: Irene Limbo, MD;  Location: [facility];  Service: Plastics;  Laterality: Right;  . SQUAMOUS CELL CARCINOMA EXCISION Right    Lower Leg  . TRAM Right 12/30/2013   Procedure: TRANSVERSE RECTUS ABDOMINIS MYOCUTANEOUS (TRAM) FLAP FOR RIGHT BREAST RECONSTRUCTION;  Surgeon: Irene Limbo, MD;  Location: [facility];  Service: Plastics;  Laterality: Right;   Family History  Problem Relation Age of Onset  . Breast cancer Mother   . Heart failure Father   . Breast cancer Paternal Aunt   . Lymphoma Cousin        non- hodgkins lymphoma  . Colon cancer Neg Hx   . Rectal cancer Neg Hx   . Esophageal cancer Neg Hx   . Stomach cancer Neg Hx   . Liver cancer Neg Hx    Social History   Socioeconomic History  . Marital status: Married    Spouse name: Not on file  . Number of  children: 2  . Years of education: 63  . Highest education level: Some college, no degree  Occupational History  . Occupation: retired  Scientific laboratory technician  . Financial resource strain: Not on file  . Food insecurity:    Worry: Not on file    Inability: Not on file  . Transportation needs:    Medical: Not on file    Non-medical: Not on file  Tobacco Use  . Smoking status: Never Smoker  . Smokeless tobacco: Never Used  Substance and Sexual Activity  . Alcohol use: Yes    Alcohol/week: 5.0 standard drinks    Types: 5 Glasses of wine per week    Comment: occaionally  . Drug use: No  . Sexual activity: Yes  Lifestyle  . Physical activity:    Days per week: Not on file    Minutes  per session: Not on file  . Stress: Not on file  Relationships  . Social connections:    Talks on phone: Not on file    Gets together: Not on file    Attends religious service: Not on file    Active member of club or organization: Not on file    Attends meetings of clubs or organizations: Not on file    Relationship status: Not on file  Other Topics Concern  . Not on file  Social History Narrative  . Not on file    Activities of Daily Living In your present state of health, do you have any difficulty performing the following activities: 07/22/2018  Hearing? N  Vision? N  Difficulty concentrating or making decisions? N  Walking or climbing stairs? N  Dressing or bathing? N  Doing errands, shopping? N  Preparing Food and eating ? N  Using the Toilet? N  In the past six months, have you accidently leaked urine? N  Do you have problems with loss of bowel control? N  Managing your Medications? N  Managing your Finances? N  Housekeeping or managing your Housekeeping? N  Some recent data might be hidden     Exercise Current Exercise Habits: Home exercise routine;Structured exercise class, Type of exercise: yoga;walking;Other - see comments(zumba), Time (Minutes): 60, Frequency (Times/Week): 5, Weekly  Exercise (Minutes/Week): 300, Intensity: Moderate  Diet Patient reports consuming 3 meals a day and 1 snack(s) a day Patient reports that her primary diet is: Low carboyhdrate Patient reports that she does have regular access to food.   Depression Screen PHQ 2/9 Scores 07/22/2018 07/18/2018 02/08/2018 09/14/2017 04/23/2017 04/04/2017 08/30/2016  PHQ - 2 Score 0 0 0 0 0 0 0  PHQ- 9 Score - 0 - - - - -     Fall Risk Fall Risk  07/22/2018 07/18/2018 02/08/2018 02/08/2018 09/14/2017  Falls in the past year? 0 0 1 0 No  Number falls in past yr: - - 1 - -  Injury with Fall? - - 0 - -     Objective:  Penny Howard seemed alert and oriented and she participated appropriately during our telephone visit.  Blood Pressure Weight BMI  BP Readings from Last 3 Encounters:  07/18/18 138/83  02/08/18 133/82  09/14/17 133/77   Wt Readings from Last 3 Encounters:  07/18/18 129 lb (58.5 kg)  02/08/18 137 lb 3.2 oz (62.2 kg)  09/14/17 134 lb 6.4 oz (61 kg)   BMI Readings from Last 1 Encounters:  07/18/18 26.05 kg/m    *Unable to obtain current vital signs, weight, and BMI due to telephone visit type  Hearing/Vision  . Ameirah did not seem to have difficulty with hearing/understanding during the telephone conversation . Reports that she has had a formal eye exam by an eye care professional within the past year . Reports that she has not had a formal hearing evaluation within the past year *Unable to fully assess hearing and vision during telephone visit type  Cognitive Function: 6CIT Screen 07/22/2018  What Year? 0 points  What month? 0 points  What time? 0 points  Count back from 20 0 points  Months in reverse 0 points  Repeat phrase 0 points  Total Score 0    Normal Cognitive Function Screening: Yes (Normal:0-7, Significant for Dysfunction: >8)  Immunization & Health Maintenance Record Immunization History  Administered Date(s) Administered  . Influenza, High Dose Seasonal PF 02/08/2018   . Influenza-Unspecified 12/14/2005  . Pneumococcal Conjugate-13  04/04/2017  . Tdap 02/02/2010    Health Maintenance  Topic Date Due  . PNA vac Low Risk Adult (2 of 2 - PPSV23) 04/04/2018  . INFLUENZA VACCINE  09/21/2018  . MAMMOGRAM  07/25/2019  . TETANUS/TDAP  02/03/2020  . COLONOSCOPY  10/26/2025  . DEXA SCAN  Completed  . Hepatitis C Screening  Completed       Assessment  This is a routine wellness examination for Penny Howard.  Health Maintenance: Due or Overdue Health Maintenance Due  Topic Date Due  . PNA vac Low Risk Adult (2 of 2 - PPSV23) 04/04/2018   Recommend at next office visit.   Penny Howard does not need a referral for Community Assistance: Care Management:   no Social Work:    no Prescription Assistance:  no Nutrition/Diabetes Education:  no   Plan:  Personalized Goals Goals Addressed            This Visit's Progress   . Weight (lb) < 126 lb (57.2 kg) (pt-stated)       Increase activity and reduce portion size of high carbohydrate foods.       Personalized Health Maintenance & Screening Recommendations   Screening mammography Advanced directives: has NO advanced directive  - add't info requested. Referral to SW: no Pneumovax 23 and Shingrix vaccines recommended in the future   Lung Cancer Screening Recommended: no (Low Dose CT Chest recommended if Age 45-80 years, 30 pack-year currently smoking OR have quit w/in past 15 years) Hepatitis C Screening recommended: completed 11/22/2016   Advanced Directives: Written information was prepared per patient's request.  Mailed to patient  Referrals & Orders N/A  Follow-up Plan . Follow-up with [name], DO as planned . Keep mammogram appointment in July.  . Follow up with any specialist doctors as scheduled.   I have personally reviewed and noted the following in the patient's chart:   . Medical and social history . Use of alcohol, tobacco or illicit drugs  . Current  medications and supplements . Functional ability and status . Nutritional status . Physical activity . Advanced directives . List of other physicians . Hospitalizations, surgeries, and ER visits in previous 12 months . Vitals . Screenings to include cognitive, depression, and falls . Referrals and appointments  In addition, I have reviewed and discussed with Penny Howard certain preventive protocols, quality metrics, and best practice recommendations. A written personalized care plan for preventive services as well as general preventive health recommendations is available and can be mailed to the patient at her request.      WYATT, AMY M  07/22/2018

## 2018-07-22 NOTE — Patient Instructions (Signed)
  Penny Howard , Thank you for taking time to talk with me for your Medicare Wellness Visit. I appreciate your ongoing commitment to your health goals. Please review the following plan we discussed and let me know if I can assist you in the future.   These are the goals we discussed: Goals    . Weight (lb) < 126 lb (57.2 kg) (pt-stated)     Increase activity and reduce portion size of high carbohydrate foods.        This is a list of the screening recommended for you and due dates:  Health Maintenance  Topic Date Due  . Pneumonia vaccines (2 of 2 - PPSV23) 04/04/2018  . Flu Shot  09/21/2018  . Mammogram  07/25/2019  . Tetanus Vaccine  02/03/2020  . Colon Cancer Screening  10/26/2025  . DEXA scan (bone density measurement)  Completed  .  Hepatitis C: One time screening is recommended by Center for Disease Control  (CDC) for  adults born from [birth year] through 1963.   Completed

## 2018-07-26 ENCOUNTER — Ambulatory Visit: Payer: Medicare Other | Admitting: Family Medicine

## 2018-08-06 ENCOUNTER — Other Ambulatory Visit: Payer: Self-pay | Admitting: Lab

## 2018-08-06 ENCOUNTER — Other Ambulatory Visit: Payer: Self-pay | Admitting: *Deleted

## 2018-08-06 ENCOUNTER — Telehealth: Payer: Self-pay

## 2018-08-06 DIAGNOSIS — C50411 Malignant neoplasm of upper-outer quadrant of right female breast: Secondary | ICD-10-CM

## 2018-08-06 DIAGNOSIS — Z170 Estrogen receptor positive status [ER+]: Secondary | ICD-10-CM

## 2018-08-06 NOTE — Telephone Encounter (Signed)
Patient calling for referral for Carotid ultrasound, bilateral.  Office visit notes from last visit discuss having this done.

## 2018-08-06 NOTE — Telephone Encounter (Signed)
This was ordered 07/18/2018.  Im not sure why it was not scheduled.

## 2018-08-06 NOTE — Progress Notes (Signed)
Penny Howard  [phone] [phone]     ID: Penny Howard OB: 03-17-1945  MR#: [id number]  JYN#:[id number]  Patient Care Team: [name], DO as PCP - General (Family Medicine) Juanita Craver, MD as Consulting Physician (Gastroenterology) Michael Boston, MD as Consulting Physician (General Surgery) Mauri Pole, MD as Consulting Physician (Gastroenterology) Britiny Defrain, Virgie Dad, MD as Consulting Physician (Oncology) Irene Limbo, MD as Consulting Physician (Plastic Surgery) SU: Excell Seltzer OTHER MD: Kyung Rudd, Arnoldo Hooker Thimmappa   CHIEF COMPLAINT: Estrogen receptor positive breast cancer  CURRENT TREATMENT: Completing 5 years of tamoxifen   BREAST CANCER HISTORY: From the original intake note:  Penny Howard was bending over to get into her hot tub when she noted a mass in her right breast. She intended to keep it to herself, but her husband of 60 years, Penny Howard, noted her changed expression, asked her what it was about, and when she told him they got out of the top and called on her primary care physician. On 06/02/2013 the patient underwent right breast mammography and ultrasonography,  which showed an area of increased density in the superior aspect of the right breast, which was palpable and firm. By palpation it measured approximately 5 cm. Ultrasound located an irregular mass in the right breast at the 12:00 position measuring 4 cm maximally. Ultrasound of the right axilla showed normal axillary contents.  Biopsy of the right breast mass in question 06/02/2013 ([id number]) confirmed an invasive lobular tumor, grade 2, estrogen and progesterone receptors both 100% positive in both with strong staining intensity, with an MIB-1 of 80%. HER-2 is pending.  On 06/03/2013 the patient underwent left breast mammography, which was negative. On 06/09/2013 the patient underwent bilateral breast MRI. This showed, in the left breast, multiple  enhancing nodules in the central portion measuring up to 4.6 cm in aggregate. There was evidence of nipple retraction. The largest single  discrete mass measured 2.7 cm. There was no involvement of the pectoralis and no abnormal appearing lymph nodes. The right breast was unremarkable.  The patient's subsequent history is as detailed below   INTERVAL HISTORY: Penny Howard was seen today for follow-up and treatment of her estrogen receptor positive breast cancer.  She continues on tamoxifen. She tolerates this well and without any noticeable side effects.    Since her last visit here, she underwent a dexa scan on 02/08/2018, showing a T-score of -2.1, which is considered osteopenic.     She is scheduled for her annual mammography on 08/28/2018.    REVIEW OF SYSTEMS: Penny Howard notes that she was doing zumba and walking before her gym closed, but now she just walks around her neighborhood; she generally walks around 1.5 to 2 miles most days. She has been gardening as well. She notes that she is worried about her youngest daughter due to some recent breast changes. The patient denies unusual headaches, visual changes, nausea, vomiting, or dizziness. There has been no unusual cough, phlegm production, or pleurisy. This been no change in bowel or bladder habits. The patient denies unexplained fatigue or unexplained weight loss, bleeding, rash, or fever. A detailed review of systems was otherwise noncontributory.    PAST MEDICAL HISTORY: Past Medical History:  Diagnosis Date   Abnormal glucose tolerance test 05/02/2011   Allergy    Anxiety    Bruit of right carotid artery 07/18/2018   Cancer (HCC)    breast rt   GERD (gastroesophageal reflux disease)    High cholesterol  Hypertension    Liver fibrosis    Osteoporosis    Other malaise and fatigue 02/02/2010   Squamous cell carcinoma    Stress incontinence    Unspecified urinary incontinence 11/03/2004   Wears contact lenses      PAST SURGICAL HISTORY: Past Surgical History:  Procedure Laterality Date   ABDOMINAL HYSTERECTOMY  1990   ovaries intact b/l   BLADDER SURGERY  10   mesh   COLONOSCOPY     LIPOSUCTION WITH LIPOFILLING Right 04/24/2014   Procedure: LIPOFILLING TO RIGHT CHEST/RIGHT NIPPLE AREOLA COMPLEX CREATION WITH FULL THICKNESS SKIN GRAFT/REVISION OF RIGHT TRANSFLAP;  Surgeon: Irene Limbo, MD;  Location: [facility];  Service: Plastics;  Laterality: Right;   MASTECTOMY Right 07/04/2013   MASTECTOMY W/ SENTINEL NODE BIOPSY  07/04/2013   RT TOTAL            DR Excell Seltzer   SCAR REVISION Right 04/24/2014   Procedure: SCAR REVISION;  Surgeon: Irene Limbo, MD;  Location: [facility];  Service: Plastics;  Laterality: Right;   SIMPLE MASTECTOMY WITH AXILLARY SENTINEL NODE BIOPSY Right 07/04/2013   Procedure: RIGHT TOTAL MASTECTOMY WITH RIGHT  AXILLARY SENTINEL LYMPH NODE BIOPSY;  Surgeon: Edward Jolly, MD;  Location: [facility];  Service: General;  Laterality: Right;   SKIN FULL THICKNESS GRAFT Right 04/24/2014   Procedure: SKIN GRAFT FULL THICKNESS;  Surgeon: Irene Limbo, MD;  Location: [facility];  Service: Plastics;  Laterality: Right;   SQUAMOUS CELL CARCINOMA EXCISION Right    Lower Leg   TRAM Right 12/30/2013   Procedure: TRANSVERSE RECTUS ABDOMINIS MYOCUTANEOUS (TRAM) FLAP FOR RIGHT BREAST RECONSTRUCTION;  Surgeon: Irene Limbo, MD;  Location: [facility];  Service: Plastics;  Laterality: Right;    FAMILY HISTORY Family History  Problem Relation Age of Onset   Breast cancer Mother    Heart failure Father    Breast cancer Paternal Aunt    Lymphoma Cousin        non- hodgkins lymphoma   Colon cancer Neg Hx    Rectal cancer Neg Hx    Esophageal cancer Neg Hx    Stomach cancer Neg Hx    Liver cancer Neg Hx    The patient's father died with congestive heart failure at the age of 20. The patient's mother died from "multiple  causes" at the age of 23. Penny Howard had one brother, no sisters. The patient's mother was diagnosed with breast cancer the age of 20 there is also a paternal aunt diagnosed with breast cancer at the age of 2.    GYNECOLOGIC HISTORY:  Menarche age 34, first live birth age 59, the patient is [place] P2. She stopped having periods approximately 1990. She did not take hormone replacement. 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 Her husband Penny Howard (goes by "Penny Howard") used to work Sales executive stations. Their name is pronounced "Nam-zura"--it is of [place] origin. Daughter Penny Howard lives in [place] for she is a Nurse, children's and daughter Penny Howard lives in [place] where she is a housewife. The patient has 1 grandson, 59 years old as of June 2020, scheduled to get married next year.  The patient attends a [employer].    ADVANCED DIRECTIVES: Not in place   HEALTH MAINTENANCE: Social History   Tobacco Use   Smoking status: Never Smoker   Smokeless tobacco: Never Used  Substance Use Topics   Alcohol use: Yes    Alcohol/week: 5.0 standard drinks    Types: 5 Glasses of wine per week    Comment: occaionally   Drug use: No       Allergies  Allergen Reactions   Codeine Nausea Only    Current Outpatient Medications  Medication Sig Dispense Refill   aspirin EC 81 MG tablet Take 81 mg by mouth daily.     escitalopram (LEXAPRO) 10 MG tablet Take 1 tablet (10 mg total) by mouth daily. 90 tablet 3   esomeprazole (NEXIUM) 20 MG capsule TAKE 1 CAPSULE DAILY AT    NOON 90 capsule 3   lisinopril (ZESTRIL) 10 MG tablet Take 1 tablet (10 mg total) by mouth daily. 90 tablet 3   multivitamin-lutein (OCUVITE-LUTEIN) CAPS capsule Take 1 capsule by mouth daily. 250 mg capsule     pravastatin (PRAVACHOL) 20 MG tablet Take 1 tablet (20 mg total) by mouth daily. 90 tablet 3    triamcinolone (KENALOG) 0.025 % ointment Apply 1 application topically 2 (two) times daily. (Patient not taking: Reported on 07/22/2018) 30 g 0   vitamin E (VITAMIN E) 400 UNIT capsule Take 400 Units by mouth daily.     No current facility-administered medications for this visit.     OBJECTIVE: Middle-aged white woman in no acute distress  Vitals:   08/07/18 1139  BP: (!) 148/75  Pulse: 77  Resp: 18  Temp: 97.8 F (36.6 C)  SpO2: 100%     Body mass index is 26.44 kg/m.    ECOG FS:1 - Symptomatic but completely ambulatory  Sclerae unicteric, EOMs intact No cervical or supraclavicular adenopathy Lungs no rales or rhonchi Heart regular rate and rhythm Abd soft, nontender, positive bowel sounds MSK no focal spinal tenderness, no upper extremity lymphedema Neuro: nonfocal, well oriented, appropriate affect Breasts: Status post right mastectomy with TRAM reconstruction, with an excellent cosmetic result and no evidence of disease recurrence.  The left breast is benign.  Both axillae are benign.  LAB RESULTS:  CMP     Component Value Date/Time   NA 139 08/07/2018 1105   NA 140 08/30/2016 0956   NA 141 08/07/2016 1231   K 3.9 08/07/2018 1105   K 4.3 08/07/2016 1231   CL 106 08/07/2018 1105   CO2 22 08/07/2018 1105   CO2 27 08/07/2016 1231   GLUCOSE 101 (H) 08/07/2018 1105   GLUCOSE 91 08/07/2016 1231   BUN 14 08/07/2018 1105   BUN 12 08/30/2016 0956   BUN 10.9 08/07/2016 1231   CREATININE 0.79 08/07/2018 1105   CREATININE 0.8 08/07/2016 1231   CALCIUM 9.7 08/07/2018 1105   CALCIUM 10.0 08/07/2016 1231   PROT 8.1 08/07/2018 1105   PROT 7.2 08/30/2016 0956   PROT 7.7 08/07/2016 1231   ALBUMIN 4.4 08/07/2018 1105   ALBUMIN 4.4 08/30/2016 0956   ALBUMIN 4.1 08/07/2016 1231   AST 37 08/07/2018 1105   AST 129 (H) 08/07/2016 1231   ALT 34 08/07/2018 1105   ALT 91 (H) 08/07/2016 1231   ALKPHOS 49 08/07/2018 1105   ALKPHOS 69 08/07/2016 1231   BILITOT 0.4 08/07/2018  1105   BILITOT 0.51 08/07/2016 1231   GFRNONAA >60 08/07/2018 1105   GFRAA >60 08/07/2018 1105    I No results found for: SPEP  Lab Results  Component Value Date   WBC 7.3 08/07/2018   NEUTROABS 3.8 08/07/2018   HGB 13.5 08/07/2018   HCT 41.7 08/07/2018  MCV 96.3 08/07/2018   PLT 239 08/07/2018      Chemistry      Component Value Date/Time   NA 139 08/07/2018 1105   NA 140 08/30/2016 0956   NA 141 08/07/2016 1231   K 3.9 08/07/2018 1105   K 4.3 08/07/2016 1231   CL 106 08/07/2018 1105   CO2 22 08/07/2018 1105   CO2 27 08/07/2016 1231   BUN 14 08/07/2018 1105   BUN 12 08/30/2016 0956   BUN 10.9 08/07/2016 1231   CREATININE 0.79 08/07/2018 1105   CREATININE 0.8 08/07/2016 1231      Component Value Date/Time   CALCIUM 9.7 08/07/2018 1105   CALCIUM 10.0 08/07/2016 1231   ALKPHOS 49 08/07/2018 1105   ALKPHOS 69 08/07/2016 1231   AST 37 08/07/2018 1105   AST 129 (H) 08/07/2016 1231   ALT 34 08/07/2018 1105   ALT 91 (H) 08/07/2016 1231   BILITOT 0.4 08/07/2018 1105   BILITOT 0.51 08/07/2016 1231       No results found for: LABCA2  No components found for: LABCA125  No results for input(s): INR in the last 168 hours.  Urinalysis No results found for: COLORURINE   STUDIES: No results found.   ASSESSMENT: 73 y.o. [place] woman status post right breast biopsy 06/02/2013 for a clinical T2 N0, stage IIA invasive lobular breast cancer, grade 2, estrogen receptor and progesterone receptor positive, with an MIB-1 of 80%, HER-2 obtained from definitive surgery  (1) status post right mastectomy and sentinel lymph node sampling 07/04/2013 for a pT3, pN0, stage IIB invasive lobular carcinoma, grade 2, HER-2 negative  (2) Oncotype score of 6 predicts a risk of outside the breast recurrence within 10 years and 5% of the patient's only systemic treatment is tamoxifen for 5 years. It also predicts no benefit from chemotherapy  (3) tamoxifen started 09/15/2013,  discontinued June 2020  (4) underwent TRAM flap reconstruction 12/30/2013  (5) referred for genetics testing   PLAN: Charyl is now a little over 5 years out from definitive surgery for her breast cancer.  There is no evidence of disease recurrence this is very favorable.  She has completed 5 years of tamoxifen.  I do not think she would derive significant benefit from continuing tamoxifen beyond this point so we are discontinuing that.  She is very concerned about her daughter's unilateral breast discharge, which is not bloody.  Her daughter lives in [place] and is being followed by an Materials engineer.  I gave Penny Howard some information on on the management of breast discharge and also discussed genetics issues.  She tells me that there was at least one relative with pancreatic cancer on her mother side and of course her mother had breast cancer as well.  I am going to place a genetics referral for her  Otherwise I am comfortable releasing her to her primary care physician.  All she will need is a yearly mammogram preferably with tomography and a yearly physician breast exam  I will be glad to see Penny Howard again at any point in the future if and when the need arises but as of now are making no further routine appointments for her here.  Wessley Emert, Virgie Dad, MD  08/07/18 12:00 PM Medical Oncology and Hematology [facility] [address] [place], [place] [address] Tel. [phone]    Fax. [phone]  I, Jacqualyn Posey am acting as a Education administrator for Chauncey Cruel, MD.   I, Lurline Del MD, have reviewed the  above documentation for accuracy and completeness, and I agree with the above.

## 2018-08-07 ENCOUNTER — Other Ambulatory Visit: Payer: Self-pay

## 2018-08-07 ENCOUNTER — Inpatient Hospital Stay (HOSPITAL_BASED_OUTPATIENT_CLINIC_OR_DEPARTMENT_OTHER): Payer: Medicare Other | Admitting: Oncology

## 2018-08-07 ENCOUNTER — Inpatient Hospital Stay: Payer: Medicare Other | Attending: Oncology

## 2018-08-07 VITALS — BP 148/75 | HR 77 | Temp 97.8°F | Resp 18 | Ht 59.0 in | Wt 130.9 lb

## 2018-08-07 DIAGNOSIS — E7800 Pure hypercholesterolemia, unspecified: Secondary | ICD-10-CM

## 2018-08-07 DIAGNOSIS — Z853 Personal history of malignant neoplasm of breast: Secondary | ICD-10-CM | POA: Insufficient documentation

## 2018-08-07 DIAGNOSIS — M810 Age-related osteoporosis without current pathological fracture: Secondary | ICD-10-CM | POA: Diagnosis not present

## 2018-08-07 DIAGNOSIS — Z7982 Long term (current) use of aspirin: Secondary | ICD-10-CM | POA: Diagnosis not present

## 2018-08-07 DIAGNOSIS — F419 Anxiety disorder, unspecified: Secondary | ICD-10-CM | POA: Diagnosis not present

## 2018-08-07 DIAGNOSIS — C50411 Malignant neoplasm of upper-outer quadrant of right female breast: Secondary | ICD-10-CM

## 2018-08-07 DIAGNOSIS — I10 Essential (primary) hypertension: Secondary | ICD-10-CM | POA: Diagnosis not present

## 2018-08-07 DIAGNOSIS — Z85828 Personal history of other malignant neoplasm of skin: Secondary | ICD-10-CM | POA: Diagnosis not present

## 2018-08-07 DIAGNOSIS — Z803 Family history of malignant neoplasm of breast: Secondary | ICD-10-CM

## 2018-08-07 DIAGNOSIS — K219 Gastro-esophageal reflux disease without esophagitis: Secondary | ICD-10-CM | POA: Diagnosis not present

## 2018-08-07 DIAGNOSIS — Z9011 Acquired absence of right breast and nipple: Secondary | ICD-10-CM

## 2018-08-07 DIAGNOSIS — Z79899 Other long term (current) drug therapy: Secondary | ICD-10-CM | POA: Diagnosis not present

## 2018-08-07 DIAGNOSIS — Z170 Estrogen receptor positive status [ER+]: Secondary | ICD-10-CM

## 2018-08-07 LAB — CBC WITH DIFFERENTIAL (CANCER CENTER ONLY)
Abs Immature Granulocytes: 0.06 10*3/uL (ref 0.00–0.07)
Basophils Absolute: 0.1 10*3/uL (ref 0.0–0.1)
Basophils Relative: 1 %
Eosinophils Absolute: 0.1 10*3/uL (ref 0.0–0.5)
Eosinophils Relative: 1 %
HCT: 41.7 % (ref 36.0–46.0)
Hemoglobin: 13.5 g/dL (ref 12.0–15.0)
Immature Granulocytes: 1 %
Lymphocytes Relative: 36 %
Lymphs Abs: 2.6 10*3/uL (ref 0.7–4.0)
MCH: 31.2 pg (ref 26.0–34.0)
MCHC: 32.4 g/dL (ref 30.0–36.0)
MCV: 96.3 fL (ref 80.0–100.0)
Monocytes Absolute: 0.6 10*3/uL (ref 0.1–1.0)
Monocytes Relative: 9 %
Neutro Abs: 3.8 10*3/uL (ref 1.7–7.7)
Neutrophils Relative %: 52 %
Platelet Count: 239 10*3/uL (ref 150–400)
RBC: 4.33 MIL/uL (ref 3.87–5.11)
RDW: 12.8 % (ref 11.5–15.5)
WBC Count: 7.3 10*3/uL (ref 4.0–10.5)
nRBC: 0 % (ref 0.0–0.2)

## 2018-08-07 LAB — CMP (CANCER CENTER ONLY)
ALT: 34 U/L (ref 0–44)
AST: 37 U/L (ref 15–41)
Albumin: 4.4 g/dL (ref 3.5–5.0)
Alkaline Phosphatase: 49 U/L (ref 38–126)
Anion gap: 11 (ref 5–15)
BUN: 14 mg/dL (ref 8–23)
CO2: 22 mmol/L (ref 22–32)
Calcium: 9.7 mg/dL (ref 8.9–10.3)
Chloride: 106 mmol/L (ref 98–111)
Creatinine: 0.79 mg/dL (ref 0.44–1.00)
GFR, Est AFR Am: >60 mL/min (ref >60)
GFR, Estimated: >60 mL/min (ref >60)
Glucose, Bld: 101 mg/dL — ABNORMAL HIGH (ref 70–99)
Potassium: 3.9 mmol/L (ref 3.5–5.1)
Sodium: 139 mmol/L (ref 135–145)
Total Bilirubin: 0.4 mg/dL (ref 0.3–1.2)
Total Protein: 8.1 g/dL (ref 6.5–8.1)

## 2018-08-07 NOTE — Telephone Encounter (Signed)
I spoke with Carlon about this and she remembers for some reason at the time when she called patient about this she wanted to wait.  Carlon will work on getting this set up now and get here in asap.

## 2018-08-08 ENCOUNTER — Telehealth: Payer: Self-pay | Admitting: Licensed Clinical Social Worker

## 2018-08-08 NOTE — Telephone Encounter (Signed)
A genetic counseling appt has been scheduled for the pt to see Faith Rogue on 6/22 at 11am. Pt aware of appt date and time.

## 2018-08-12 ENCOUNTER — Inpatient Hospital Stay (HOSPITAL_BASED_OUTPATIENT_CLINIC_OR_DEPARTMENT_OTHER): Payer: Medicare Other | Admitting: Licensed Clinical Social Worker

## 2018-08-12 ENCOUNTER — Inpatient Hospital Stay: Payer: Medicare Other

## 2018-08-12 ENCOUNTER — Other Ambulatory Visit: Payer: Self-pay

## 2018-08-12 ENCOUNTER — Encounter: Payer: Self-pay | Admitting: Licensed Clinical Social Worker

## 2018-08-12 DIAGNOSIS — Z800 Family history of malignant neoplasm of digestive organs: Secondary | ICD-10-CM | POA: Insufficient documentation

## 2018-08-12 DIAGNOSIS — Z807 Family history of other malignant neoplasms of lymphoid, hematopoietic and related tissues: Secondary | ICD-10-CM | POA: Insufficient documentation

## 2018-08-12 DIAGNOSIS — Z803 Family history of malignant neoplasm of breast: Secondary | ICD-10-CM

## 2018-08-12 DIAGNOSIS — Z170 Estrogen receptor positive status [ER+]: Secondary | ICD-10-CM

## 2018-08-12 DIAGNOSIS — C50411 Malignant neoplasm of upper-outer quadrant of right female breast: Secondary | ICD-10-CM | POA: Diagnosis not present

## 2018-08-12 DIAGNOSIS — Z806 Family history of leukemia: Secondary | ICD-10-CM | POA: Insufficient documentation

## 2018-08-12 NOTE — Progress Notes (Addendum)
REFERRING PROVIDER: Chauncey Cruel, MD [address] [place],  [place] [address]  PRIMARY PROVIDER:  [name], DO  PRIMARY REASON FOR VISIT:  1. Malignant neoplasm of upper-outer quadrant of right breast in female, estrogen receptor positive ([facility])   2. Family history of breast cancer   3. Family history of stomach cancer   4. Family history of pancreatic cancer   5. Family history of leukemia   6. Family history of non-Hodgkin's lymphoma      HISTORY OF PRESENT ILLNESS:   Penny Howard, a 73 y.o. female, was seen for a  cancer genetics consultation at the request of Dr. Jana Hakim due to a personal and family history of cancer.  Penny Howard presents to clinic today to discuss the possibility of a hereditary predisposition to cancer, genetic testing, and to further clarify her future cancer risks, as well as potential cancer risks for family members.   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 The treatment plan included right mastectomy, tamoxifen.   RISK FACTORS:  Menarche was at age 68.  First live birth at age 17.  OCP use for approximately 16 years.  Ovaries intact: yes.  Hysterectomy: yes.  Menopausal status: postmenopausal.  HRT use: 0 years. Colonoscopy: yes; normal. Number of breast biopsies: 1.  Past Medical History:  Diagnosis Date  . Abnormal glucose tolerance test 05/02/2011  . Allergy   . Anxiety   . Bruit of right carotid artery 07/18/2018  . Cancer ([facility])    breast rt  . Family history of breast cancer   . Family history of leukemia   . Family history of non-Hodgkin's lymphoma   . Family history of pancreatic cancer   . Family history of stomach cancer   . GERD (gastroesophageal reflux disease)   . High cholesterol   . Hypertension   . Liver fibrosis   . Osteoporosis   . Other malaise and fatigue 02/02/2010  . Squamous cell carcinoma   . Stress  incontinence   . Unspecified urinary incontinence 11/03/2004  . Wears contact lenses     Past Surgical History:  Procedure Laterality Date  . ABDOMINAL HYSTERECTOMY  1990   ovaries intact b/l  . BLADDER SURGERY  12   mesh  . COLONOSCOPY    . LIPOSUCTION WITH LIPOFILLING Right 04/24/2014   Procedure: LIPOFILLING TO RIGHT CHEST/RIGHT NIPPLE AREOLA COMPLEX CREATION WITH FULL THICKNESS SKIN GRAFT/REVISION OF RIGHT TRANSFLAP;  Surgeon: Irene Limbo, MD;  Location: [facility];  Service: Plastics;  Laterality: Right;  . MASTECTOMY Right 07/04/2013  . MASTECTOMY W/ SENTINEL NODE BIOPSY  07/04/2013   RT TOTAL            DR Savage  . SCAR REVISION Right 04/24/2014   Procedure: SCAR REVISION;  Surgeon: Irene Limbo, MD;  Location: [facility];  Service: Plastics;  Laterality: Right;  . SIMPLE MASTECTOMY WITH AXILLARY SENTINEL NODE BIOPSY Right 07/04/2013   Procedure: RIGHT TOTAL MASTECTOMY WITH RIGHT  AXILLARY SENTINEL LYMPH NODE BIOPSY;  Surgeon: Edward Jolly, MD;  Location: [facility];  Service: General;  Laterality: Right;  . SKIN FULL THICKNESS GRAFT Right 04/24/2014   Procedure: SKIN GRAFT FULL THICKNESS;  Surgeon: Irene Limbo, MD;  Location: [facility];  Service: Plastics;  Laterality: Right;  . SQUAMOUS CELL CARCINOMA EXCISION Right    Lower Leg  . TRAM Right 12/30/2013   Procedure:  TRANSVERSE RECTUS ABDOMINIS MYOCUTANEOUS (TRAM) FLAP FOR RIGHT BREAST RECONSTRUCTION;  Surgeon: Irene Limbo, MD;  Location: [facility];  Service: Plastics;  Laterality: Right;    Social History   Socioeconomic History  . Marital status: Married    Spouse name: Not on file  . Number of children: 2  . Years of education: 56  . Highest education level: Some college, no degree  Occupational History  . Occupation: retired  Scientific laboratory technician  . Financial resource strain: Not on file  . Food insecurity    Worry: Not on file    Inability: Not on file  .  Transportation needs    Medical: Not on file    Non-medical: Not on file  Tobacco Use  . Smoking status: Never Smoker  . Smokeless tobacco: Never Used  Substance and Sexual Activity  . Alcohol use: Yes    Alcohol/week: 5.0 standard drinks    Types: 5 Glasses of wine per week    Comment: occaionally  . Drug use: No  . Sexual activity: Yes  Lifestyle  . Physical activity    Days per week: Not on file    Minutes per session: Not on file  . Stress: Not on file  Relationships  . Social Herbalist on phone: Not on file    Gets together: Not on file    Attends religious service: Not on file    Active member of club or organization: Not on file    Attends meetings of clubs or organizations: Not on file    Relationship status: Not on file  Other Topics Concern  . Not on file  Social History Narrative  . Not on file     FAMILY HISTORY:  We obtained a detailed, 4-generation family history.  Significant diagnoses are listed below: Family History  Problem Relation Age of Onset  . Breast cancer Mother 95  . Heart failure Father   . Breast cancer Paternal Aunt 59  . Lymphoma Cousin 35       non- hodgkins lymphoma  . Stomach cancer Maternal Uncle 18  . Pancreatic cancer Other   . Leukemia Other   . Colon cancer Neg Hx   . Rectal cancer Neg Hx   . Esophageal cancer Neg Hx   . Liver cancer Neg Hx    Penny Howard has 2 daughters: Penny Howard, age 34, and Penny Howard, age 35. She has one grandson who is 39. The patient had one brother who died at 52 in a horse riding accident.  Penny Howard mother was diagnosed with breast cancer at 55 and died at 39. The patient had 4 maternal uncles. One of her uncles had cancer, she believes stomach cancer, and died at 78. Patient's mother also had a paternal half sister, no known cancers. No known cancers in maternal cousins. Maternal grandmother died in her 67s, she is unsure of the cause. Her paternal grandfather died at 33. Her maternal  grandparents are first cousins. The patient has one maternal great uncle who had pancreatic cancer, one maternal great aunt who had leukemia, and another maternal great aunt with cancer, unk type, died in her 31s.  Penny Howard's father died at 67, no cancers. The patient had 2 paternal uncles, 1 paternal aunt. Her aunt had breast cancer at 96, died at 91. One of her uncles' children was diagnosed with Non-hodgkin's lymphoma at 7 and is living in his 15s. No other known cancers in cousins. Her paternal grandfather died at 46  of an aneurysm, paternal grandmother died at 70 of a stroke.   Penny Howard is unaware of previous family history of genetic testing for hereditary cancer risks. Patient's maternal ancestors are of unknown descent, and paternal ancestors are of [place] descent. There is no reported Ashkenazi Jewish ancestry. Her maternal grandparents were first cousins.   GENETIC COUNSELING ASSESSMENT: Penny Howard is a 73 y.o. female with a personal and family history which is somewhat suggestive of a hereditary cancer syndrome and predisposition to cancer. We, therefore, discussed and recommended the following at today's visit.   DISCUSSION: We discussed that 5 - 10% of breast cancer is hereditary, with most cases associated with the BRCA1/BRCA2 genes.  There are other genes that can be associated with hereditary breast cancer syndromes.  These include PALB2, CHEK2, ATM. We also discussed that pancreatic cancer and breast cancer can be seen in the same hereditary cancer syndrome. We reviewed the characteristics, features and inheritance patterns of hereditary cancer syndromes. We also discussed genetic testing, including the appropriate family members to test, the process of testing, insurance coverage and turn-around-time for results. We discussed the implications of a negative, positive and/or variant of uncertain significant result. We recommended Penny Howard pursue genetic testing for the  [facility] Multi-Cancer gene panel.   The Multi-Cancer Panel offered by [facility] includes sequencing and/or deletion duplication testing of the following 85 genes: AIP, ALK, APC, ATM, AXIN2,BAP1,  BARD1, BLM, BMPR1A, BRCA1, BRCA2, BRIP1, CASR, CDC73, CDH1, CDK4, CDKN1B, CDKN1C, CDKN2A (p14ARF), CDKN2A (p16INK4a), CEBPA, CHEK2, CTNNA1, DICER1, DIS3L2, EGFR (c.2369C>T, p.Thr790Met variant only), EPCAM (Deletion/duplication testing only), FH, FLCN, GATA2, GPC3, GREM1 (Promoter region deletion/duplication testing only), HOXB13 (c.251G>A, p.Gly84Glu), HRAS, KIT, MAX, MEN1, MET, MITF (c.952G>A, p.Glu318Lys variant only), MLH1, MSH2, MSH3, MSH6, MUTYH, NBN, NF1, NF2, NTHL1, PALB2, PDGFRA, PHOX2B, PMS2, POLD1, POLE, POT1, PRKAR1A, PTCH1, PTEN, RAD50, RAD51C, RAD51D, RB1, RECQL4, RET, RNF43, RUNX1, SDHAF2, SDHA (sequence changes only), SDHB, SDHC, SDHD, SMAD4, SMARCA4, SMARCB1, SMARCE1, STK11, SUFU, TERC, TERT, TMEM127, TP53, TSC1, TSC2, VHL, WRN and WT1.   Based on Penny Howard's personal and family history of cancer, she meets medical criteria for genetic testing. Despite that she meets criteria, she may still have an out of pocket cost. The lab will notify her of an OOP if any.  PLAN: After considering the risks, benefits, and limitations, Penny Howard provided informed consent to pursue genetic testing and the blood sample was sent to [facility] for analysis of the Multi-Cancer Panel. Results should be available within approximately 2-3 weeks' time, at which point they will be disclosed by telephone to Penny Howard, as will any additional recommendations warranted by these results. Penny Howard will receive a summary of her genetic counseling visit and a copy of her results once available. This information will also be available in Epic.   Based on Penny Howard's family history, we recommended her relatives more closely related to her maternal great aunt with pancreatic cancer  have genetic counseling  and testing. Penny Howard will let us know if we can be of any assistance in coordinating genetic counseling and/or testing for this family member.   Lastly, we encouraged Penny Howard to remain in contact with cancer genetics annually so that we can continuously update the family history and inform her of any changes in cancer genetics and testing that may be of benefit for this family.   Ms. Latka questions were answered to her satisfaction today. Our contact information was provided should additional questions or concerns arise. Thank you  for the referral and allowing [place] to share in the care of your patient.   Faith Rogue, MS Genetic Counselor [email or web address] Phone: [phone]  The patient was seen for a total of 25 minutes in face-to-face genetic counseling.  Drs. Magrinat, Lindi Adie and/or Burr Medico were available for discussion regarding this case.

## 2018-08-20 ENCOUNTER — Encounter: Payer: Self-pay | Admitting: Licensed Clinical Social Worker

## 2018-08-20 ENCOUNTER — Ambulatory Visit: Payer: Self-pay | Admitting: Licensed Clinical Social Worker

## 2018-08-20 ENCOUNTER — Telehealth: Payer: Self-pay | Admitting: Licensed Clinical Social Worker

## 2018-08-20 DIAGNOSIS — Z806 Family history of leukemia: Secondary | ICD-10-CM

## 2018-08-20 DIAGNOSIS — Z800 Family history of malignant neoplasm of digestive organs: Secondary | ICD-10-CM

## 2018-08-20 DIAGNOSIS — Z803 Family history of malignant neoplasm of breast: Secondary | ICD-10-CM

## 2018-08-20 DIAGNOSIS — Z807 Family history of other malignant neoplasms of lymphoid, hematopoietic and related tissues: Secondary | ICD-10-CM

## 2018-08-20 DIAGNOSIS — C50411 Malignant neoplasm of upper-outer quadrant of right female breast: Secondary | ICD-10-CM

## 2018-08-20 DIAGNOSIS — Z1379 Encounter for other screening for genetic and chromosomal anomalies: Secondary | ICD-10-CM

## 2018-08-20 NOTE — Progress Notes (Signed)
HPI:  Ms. Loree was previously seen in the [facility] due to a personal and family history of cancer and concerns regarding a hereditary predisposition to cancer. Please refer to our prior cancer genetics clinic note for more information regarding our discussion, assessment and recommendations, at the time. Ms. Gutierrez recent genetic test results were disclosed to her, as were recommendations warranted by these results. These results and recommendations are discussed in more detail below.  CANCER HISTORY:  Oncology History   No history exists.    FAMILY HISTORY:  We obtained a detailed, 4-generation family history.  Significant diagnoses are listed below: Family History  Problem Relation Age of Onset   Breast cancer Mother 39   Heart failure Father    Breast cancer Paternal Aunt 81   Lymphoma Cousin 70       non- hodgkins lymphoma   Stomach cancer Maternal Uncle 18   Pancreatic cancer Other    Leukemia Other    Colon cancer Neg Hx    Rectal cancer Neg Hx    Esophageal cancer Neg Hx    Liver cancer Neg Hx     Ms. Ruybal has 2 daughters: Alyse Low, age 62, and Almyra Free, age 17. She has one grandson who is 46. The patient had one brother who died at 78 in a horse riding accident.  Ms. Leland mother was diagnosed with breast cancer at 88 and died at 38. The patient had 4 maternal uncles. One of her uncles had cancer, she believes stomach cancer, and died at 10. Patient's mother also had a paternal half sister, no known cancers. No known cancers in maternal cousins. Maternal grandmother died in her 83s, she is unsure of the cause. Her paternal grandfather died at 57. Her maternal grandparents are first cousins. The patient has one maternal great uncle who had pancreatic cancer, one maternal great aunt who had leukemia, and another maternal great aunt with cancer, unk type, died in her 34s.  Ms. Geist's father died at 50, no cancers. The  patient had 2 paternal uncles, 1 paternal aunt. Her aunt had breast cancer at 81, died at 65. One of her uncles' children was diagnosed with Non-hodgkin's lymphoma at 24 and is living in his 8s. No other known cancers in cousins. Her paternal grandfather died at 79 of an aneurysm, paternal grandmother died at 82 of a stroke.   Ms. Ertle is unaware of previous family history of genetic testing for hereditary cancer risks. Patient's maternal ancestors are of unknown descent, and paternal ancestors are of [place] descent. There is no reported Ashkenazi Jewish ancestry. Her maternal grandparents were first cousins.   GENETIC TEST RESULTS: Genetic testing reported out on 08/18/2018 through the [facility] Multi- cancer panel found no pathogenic mutations.  The Multi-Cancer Panel offered by [facility] includes sequencing and/or deletion duplication testing of the following 85 genes: AIP, ALK, APC, ATM, AXIN2,BAP1,  BARD1, BLM, BMPR1A, BRCA1, BRCA2, BRIP1, CASR, CDC73, CDH1, CDK4, CDKN1B, CDKN1C, CDKN2A (p14ARF), CDKN2A (p16INK4a), CEBPA, CHEK2, CTNNA1, DICER1, DIS3L2, EGFR (c.2369C>T, p.Thr790Met variant only), EPCAM (Deletion/duplication testing only), FH, FLCN, GATA2, GPC3, GREM1 (Promoter region deletion/duplication testing only), HOXB13 (c.251G>A, p.Gly84Glu), HRAS, KIT, MAX, MEN1, MET, MITF (c.952G>A, p.Glu318Lys variant only), MLH1, MSH2, MSH3, MSH6, MUTYH, NBN, NF1, NF2, NTHL1, PALB2, PDGFRA, PHOX2B, PMS2, POLD1, POLE, POT1, PRKAR1A, PTCH1, PTEN, RAD50, RAD51C, RAD51D, RB1, RECQL4, RET, RNF43, RUNX1, SDHAF2, SDHA (sequence changes only), SDHB, SDHC, SDHD, SMAD4, SMARCA4, SMARCB1, SMARCE1, STK11, SUFU, TERC, TERT, TMEM127, TP53, TSC1, TSC2, VHL, WRN  and WT1.   The test report has been scanned into EPIC and is located under the Molecular Pathology section of the Results Review tab.  A portion of the result report is included below for reference.     We discussed with Ms. Cyphers that because current genetic  testing is not perfect, it is possible there may be a gene mutation in one of these genes that current testing cannot detect, but that chance is small.  We also discussed, that there could be another gene that has not yet been discovered, or that we have not yet tested, that is responsible for the cancer diagnoses in the family. It is also possible there is a hereditary cause for the cancer in the family that Ms. Maille did not inherit and therefore was not identified in her testing.  Therefore, it is important to remain in touch with cancer genetics in the future so that we can continue to offer Ms. Beach the most up to date genetic testing.   ADDITIONAL GENETIC TESTING: We discussed with Ms. Lavery that her genetic testing was fairly extensive.  If there are genes identified to increase cancer risk that can be analyzed in the future, we would be happy to discuss and coordinate this testing at that time.    CANCER SCREENING RECOMMENDATIONS: Ms. Portman's test result is considered negative (normal).  This means that we have not identified a hereditary cause for her  personal and family history of cancer at this time. Most cancers happen by chance and this negative test suggests that her cancer may fall into this category.    While reassuring, this does not definitively rule out a hereditary predisposition to cancer. It is still possible that there could be genetic mutations that are undetectable by current technology. There could be genetic mutations in genes that have not been tested or identified to increase cancer risk.  Therefore, it is recommended she continue to follow the cancer management and screening guidelines provided by her oncology and primary healthcare provider.   An individual's cancer risk and medical management are not determined by genetic test results alone. Overall cancer risk assessment incorporates additional factors, including personal medical history, family history, and  any available genetic information that may result in a personalized plan for cancer prevention and surveillance  RECOMMENDATIONS FOR FAMILY MEMBERS:  Relatives in this family might be at some increased risk of developing cancer, over the general population risk, simply due to the family history of cancer.  We recommended female relatives in this family have a yearly mammogram beginning at age 69, or 14 years younger than the earliest onset of cancer, an annual clinical breast exam, and perform monthly breast self-exams. Female relatives in this family should also have a gynecological exam as recommended by their primary provider. All family members should have a colonoscopy by age 100, or as directed by their physicians.   It is also possible there is a hereditary cause for the cancer in Ms. Gemma's family that she did not inherit and therefore was not identified in her.  Based on Ms. Palomo's family history, we recommended maternal relatives have genetic counseling and testing. Ms. Esperanza will let us know if we can be of any assistance in coordinating genetic counseling and/or testing for these family members.  FOLLOW-UP: Lastly, we discussed with Ms. Lau that cancer genetics is a rapidly advancing field and it is possible that new genetic tests will be appropriate for her and/or her family members in the  future. We encouraged her to remain in contact with cancer genetics on an annual basis so we can update her personal and family histories and let her know of advances in cancer genetics that may benefit this family.   Our contact number was provided. Ms. Keltner questions were answered to her satisfaction, and she knows she is welcome to call us at anytime with additional questions or concerns.   Faith Rogue, MS Genetic Counselor [email or web address] Phone: [phone]

## 2018-08-20 NOTE — Telephone Encounter (Signed)
Revealed negative genetic testing. This normal result is reassuring and indicates that it is unlikely Penny Howard's cancer is due to a hereditary cause.  It is unlikely that there is an increased risk of another cancer due to a mutation in one of these genes.  However, genetic testing is not perfect, and cannot definitively rule out a hereditary cause.  It will be important for her to keep in contact with genetics to learn if any additional testing may be needed in the future.

## 2018-08-28 ENCOUNTER — Other Ambulatory Visit: Payer: Self-pay

## 2018-08-28 ENCOUNTER — Ambulatory Visit
Admission: RE | Admit: 2018-08-28 | Discharge: 2018-08-28 | Disposition: A | Discharge status: Home / Self Care | Payer: Medicare Other | Source: Ambulatory Visit | Attending: Oncology | Admitting: Oncology

## 2018-08-28 DIAGNOSIS — Z1231 Encounter for screening mammogram for malignant neoplasm of breast: Secondary | ICD-10-CM | POA: Diagnosis not present

## 2018-09-05 ENCOUNTER — Telehealth: Payer: Self-pay | Admitting: Family Medicine

## 2018-09-06 NOTE — Telephone Encounter (Signed)
Was ordered 07/18/2018.  Does not look like this was set up.  Will cc: Caryl Pina

## 2018-09-10 NOTE — Telephone Encounter (Signed)
Pt is sch'd for Forestine Na 09/16/18 at 12:30 pm - Pt is aware.

## 2018-09-16 ENCOUNTER — Other Ambulatory Visit: Payer: Self-pay

## 2018-09-16 ENCOUNTER — Ambulatory Visit (HOSPITAL_COMMUNITY)
Admission: RE | Admit: 2018-09-16 | Discharge: 2018-09-16 | Disposition: A | Discharge status: Home / Self Care | Payer: Medicare Other | Source: Ambulatory Visit | Attending: Family Medicine | Admitting: Family Medicine

## 2018-09-16 DIAGNOSIS — I6521 Occlusion and stenosis of right carotid artery: Secondary | ICD-10-CM | POA: Diagnosis not present

## 2018-09-16 DIAGNOSIS — I6523 Occlusion and stenosis of bilateral carotid arteries: Secondary | ICD-10-CM | POA: Diagnosis not present

## 2018-09-16 DIAGNOSIS — R0989 Other specified symptoms and signs involving the circulatory and respiratory systems: Secondary | ICD-10-CM | POA: Insufficient documentation

## 2018-12-11 DIAGNOSIS — L820 Inflamed seborrheic keratosis: Secondary | ICD-10-CM | POA: Diagnosis not present

## 2018-12-11 DIAGNOSIS — L570 Actinic keratosis: Secondary | ICD-10-CM | POA: Diagnosis not present

## 2018-12-11 DIAGNOSIS — I781 Nevus, non-neoplastic: Secondary | ICD-10-CM | POA: Diagnosis not present

## 2018-12-11 DIAGNOSIS — L814 Other melanin hyperpigmentation: Secondary | ICD-10-CM | POA: Diagnosis not present

## 2018-12-11 DIAGNOSIS — L821 Other seborrheic keratosis: Secondary | ICD-10-CM | POA: Diagnosis not present

## 2018-12-11 DIAGNOSIS — Z85828 Personal history of other malignant neoplasm of skin: Secondary | ICD-10-CM | POA: Diagnosis not present

## 2019-01-08 ENCOUNTER — Other Ambulatory Visit: Payer: Self-pay

## 2019-01-09 ENCOUNTER — Ambulatory Visit (INDEPENDENT_AMBULATORY_CARE_PROVIDER_SITE_OTHER): Payer: Medicare Other

## 2019-01-09 DIAGNOSIS — Z23 Encounter for immunization: Secondary | ICD-10-CM | POA: Diagnosis not present

## 2019-04-30 ENCOUNTER — Ambulatory Visit (INDEPENDENT_AMBULATORY_CARE_PROVIDER_SITE_OTHER): Payer: Medicare Other | Admitting: Family Medicine

## 2019-04-30 ENCOUNTER — Encounter: Payer: Self-pay | Admitting: Family Medicine

## 2019-04-30 ENCOUNTER — Ambulatory Visit (INDEPENDENT_AMBULATORY_CARE_PROVIDER_SITE_OTHER): Payer: Medicare Other

## 2019-04-30 ENCOUNTER — Other Ambulatory Visit: Payer: Self-pay

## 2019-04-30 VITALS — BP 115/70 | HR 92 | Temp 97.8°F | Ht 59.0 in | Wt 133.0 lb

## 2019-04-30 DIAGNOSIS — M654 Radial styloid tenosynovitis [de Quervain]: Secondary | ICD-10-CM

## 2019-04-30 DIAGNOSIS — R52 Pain, unspecified: Secondary | ICD-10-CM

## 2019-04-30 DIAGNOSIS — M25532 Pain in left wrist: Secondary | ICD-10-CM

## 2019-04-30 MED ORDER — MELOXICAM 7.5 MG PO TABS: 7.5000 mg | ORAL_TABLET | Freq: Every day | ORAL | 3 refills | Status: DC

## 2019-04-30 NOTE — Patient Instructions (Addendum)
I have scheduled you with Dr Dettinger in 2 weeks for possible wrist injection.  If your symptoms are better with the physical therapy and the Meloxicam, ok to cancel.  Make sure to take the Meloxicam TOTALLY separate from your Lexapro.  De Quervain's Tenosynovitis  De Quervain's tenosynovitis is a condition that causes inflammation of the tendon on the thumb side of the wrist. Tendons are cords of tissue that connect bones to muscles. The tendons in the hand pass through a tunnel called a sheath. A slippery layer of tissue (synovium) lets the tendons move smoothly in the sheath. With de Quervain's tenosynovitis, the sheath swells or thickens, causing friction and pain. The condition is also called de Quervain's disease and de Quervain's syndrome. It occurs most often in women who are 10-75 years old. What are the causes? The exact cause of this condition is not known. It may be associated with overuse of the hand and wrist. What increases the risk? You are more likely to develop this condition if you:  Use your hands far more than normal, especially if you repeat certain movements that involve twisting your hand or using a tight grip.  Are pregnant.  Are a middle-aged woman.  Have rheumatoid arthritis.  Have diabetes. What are the signs or symptoms? The main symptom of this condition is pain on the thumb side of the wrist. The pain may get worse when you grasp something or turn your wrist. Other symptoms may include:  Pain that extends up the forearm.  Swelling of your wrist and hand.  Trouble moving the thumb and wrist.  A sensation of snapping in the wrist.  A bump filled with fluid (cyst) in the area of the pain. How is this diagnosed? This condition may be diagnosed based on:  Your symptoms and medical history.  A physical exam. During the exam, your health care provider may do a simple test Wynn Maudlin test) that involves pulling your thumb and wrist to see if this causes  pain. You may also need to have an X-ray. How is this treated? Treatment for this condition may include:  Avoiding any activity that causes pain and swelling.  Taking medicines. Anti-inflammatory medicines and corticosteroid injections may be used to reduce inflammation and relieve pain.  Wearing a splint.  Having surgery. This may be needed if other treatments do not work. Once the pain and swelling has gone down:  Physical therapy. This includes stretching and strengthening exercises.  Occupational therapy. This includes adjusting how you move your wrist. Follow these instructions at home: If you have a splint:  Wear the splint as told by your health care provider. Remove it only as told by your health care provider.  Loosen the splint if your fingers tingle, become numb, or turn cold and blue.  Keep the splint clean.  If the splint is not waterproof: ? Do not let it get wet. ? Cover it with a watertight covering when you take a bath or a shower. Managing pain, stiffness, and swelling   Avoid movements and activities that cause pain and swelling in the wrist area.  If directed, put ice on the painful area. This may be helpful after doing activities that involve the sore wrist. ? Put ice in a plastic bag. ? Place a towel between your skin and the bag. ? Leave the ice on for 20 minutes, 2-3 times a day.  Move your fingers often to avoid stiffness and to lessen swelling.  Raise (elevate) the injured  area above the level of your heart while you are sitting or lying down. General instructions  Return to your normal activities as told by your health care provider. Ask your health care provider what activities are safe for you.  Take over-the-counter and prescription medicines only as told by your health care provider.  Keep all follow-up visits as told by your health care provider. This is important. Contact a health care provider if:  Your pain medicine does not  help.  Your pain gets worse.  You develop new symptoms. Summary  De Quervain's tenosynovitis is a condition that causes inflammation of the tendon on the thumb side of the wrist.  The condition occurs most often in women who are 85-46 years old.  The exact cause of this condition is not known. It may be associated with overuse of the hand and wrist.  Treatment starts with avoiding activity that causes pain or swelling in the wrist area. Other treatment may include wearing a splint and taking medicine. Sometimes, surgery is needed. This information is not intended to replace advice given to you by your health care provider. Make sure you discuss any questions you have with your health care provider. Document Revised: 08/09/2017 Document Reviewed: 01/15/2017 Elsevier Patient Education  2020 [employer].

## 2019-04-30 NOTE — Progress Notes (Signed)
Subjective: CC: left wrist pain PCP: [name], DO [name] is a 74 y.o. female presenting to clinic today for:  1. Wrist pain Patient reports a 6-week history of a left-sided wrist pain.  She points to the radial aspect of the left wrist as the area of pain.  She notes that it tends to radiate some up the arm and some down the thumb.  Denies any sensory changes, preceding injury, change in activity.  She notes occasional swelling that comes and goes.  She has been using a brace and Motrin in efforts to improve symptoms.   ROS: Per HPI  Allergies  Allergen Reactions  . Codeine Nausea Only   Past Medical History:  Diagnosis Date  . Abnormal glucose tolerance test 05/02/2011  . Allergy   . Anxiety   . Bruit of right carotid artery 07/18/2018  . Cancer ([place])    breast rt  . Family history of breast cancer   . Family history of leukemia   . Family history of non-Hodgkin's lymphoma   . Family history of pancreatic cancer   . Family history of stomach cancer   . GERD (gastroesophageal reflux disease)   . High cholesterol   . Hypertension   . Liver fibrosis   . Osteoporosis   . Other malaise and fatigue 02/02/2010  . Squamous cell carcinoma   . Stress incontinence   . Unspecified urinary incontinence 11/03/2004  . Wears contact lenses     Current Outpatient Medications:  .  aspirin EC 81 MG tablet, Take 81 mg by mouth daily., Disp: , Rfl:  .  escitalopram (LEXAPRO) 10 MG tablet, Take 1 tablet (10 mg total) by mouth daily., Disp: 90 tablet, Rfl: 3 .  esomeprazole (NEXIUM) 20 MG capsule, TAKE 1 CAPSULE DAILY AT    NOON, Disp: 90 capsule, Rfl: 3 .  lisinopril (ZESTRIL) 10 MG tablet, Take 1 tablet (10 mg total) by mouth daily., Disp: 90 tablet, Rfl: 3 .  multivitamin-lutein (OCUVITE-LUTEIN) CAPS capsule, Take 1 capsule by mouth daily. 250 mg capsule, Disp: , Rfl:  .  pravastatin (PRAVACHOL) 20 MG tablet, Take 1 tablet (20 mg total) by mouth daily., Disp: 90  tablet, Rfl: 3 .  triamcinolone (KENALOG) 0.025 % ointment, Apply 1 application topically 2 (two) times daily. (Patient not taking: Reported on 07/22/2018), Disp: 30 g, Rfl: 0 .  vitamin E (VITAMIN E) 400 UNIT capsule, Take 400 Units by mouth daily., Disp: , Rfl:  Social History   Socioeconomic History  . Marital status: Married    Spouse name: Not on file  . Number of children: 2  . Years of education: 45  . Highest education level: Some college, no degree  Occupational History  . Occupation: retired  Tobacco Use  . Smoking status: Never Smoker  . Smokeless tobacco: Never Used  Substance and Sexual Activity  . Alcohol use: Yes    Alcohol/week: 5.0 standard drinks    Types: 5 Glasses of wine per week    Comment: occaionally  . Drug use: No  . Sexual activity: Yes  Other Topics Concern  . Not on file  Social History Narrative  . Not on file   Social Determinants of Health   Financial Resource Strain:   . Difficulty of Paying Living Expenses: Not on file  Food Insecurity:   . Worried About Charity fundraiser in the Last Year: Not on file  . Ran Out of Food in the Last Year: Not  on file  Transportation Needs:   . Lack of Transportation (Medical): Not on file  . Lack of Transportation (Non-Medical): Not on file  Physical Activity:   . Days of Exercise per Week: Not on file  . Minutes of Exercise per Session: Not on file  Stress:   . Feeling of Stress : Not on file  Social Connections:   . Frequency of Communication with Friends and Family: Not on file  . Frequency of Social Gatherings with Friends and Family: Not on file  . Attends Religious Services: Not on file  . Active Member of Clubs or Organizations: Not on file  . Attends Archivist Meetings: Not on file  . Marital Status: Not on file  Intimate Partner Violence:   . Fear of Current or Ex-Partner: Not on file  . Emotionally Abused: Not on file  . Physically Abused: Not on file  . Sexually Abused: Not  on file   Family History  Problem Relation Age of Onset  . Breast cancer Mother 34  . Heart failure Father   . Breast cancer Paternal Aunt 36  . Lymphoma Cousin 35       non- hodgkins lymphoma  . Stomach cancer Maternal Uncle 18  . Pancreatic cancer Other   . Leukemia Other   . Colon cancer Neg Hx   . Rectal cancer Neg Hx   . Esophageal cancer Neg Hx   . Liver cancer Neg Hx     Objective: Office vital signs reviewed. BP 115/70   Pulse 92   Temp 97.8 F (36.6 C) (Temporal)   Ht 4' 11"  (1.499 m)   Wt 133 lb (60.3 kg)   SpO2 96%   BMI 26.86 kg/m   Physical Examination:  General: Awake, alert, well nourished, No acute distress Extremities: warm, well perfused, No edema, cyanosis or clubbing; +2 pulses bilaterally MSK: Positive Finkelstein test on the left.  Minimal soft tissue swelling noted.  No palpable bony abnormalities.  No increased warmth or redness. Skin: dry; intact; no rashes or lesions Neuro: Light touch and station grossly intact No results found.  Assessment/ Plan: 74 y.o. female   1. De Quervain's tenosynovitis, left Physical exam consistent with de Quervain's tenosynovitis.  Personal review of x-ray with no evidence of fracture.  Formal review by radiology.  I have recommended she discontinue use of OTC NSAID.  Will trial with meloxicam daily.  Advised to ice and continue bracing as needed.  I have set her up for an appointment in 2 weeks, if symptoms are not improving we will plan to do a corticosteroid injection. - meloxicam (MOBIC) 7.5 MG tablet; Take 1 tablet (7.5 mg total) by mouth daily. With food x2 weeks. Then daily as needed  Dispense: 30 tablet; Refill: 3  2. Left wrist pain - DG Wrist Complete Left; Future - meloxicam (MOBIC) 7.5 MG tablet; Take 1 tablet (7.5 mg total) by mouth daily. With food x2 weeks. Then daily as needed  Dispense: 30 tablet; Refill: 3   Orders Placed This Encounter  Procedures  . DG Wrist Complete Left    Standing  Status:   Future    Number of Occurrences:   1    Standing Expiration Date:   06/29/2020    Order Specific Question:   Reason for Exam (SYMPTOM  OR DIAGNOSIS REQUIRED)    Answer:   pain    Order Specific Question:   Preferred imaging location?    Answer:   Internal  No orders of the defined types were placed in this encounter.    [name], DO [facility] [phone]

## 2019-05-01 ENCOUNTER — Other Ambulatory Visit: Payer: Self-pay | Admitting: Family Medicine

## 2019-05-09 ENCOUNTER — Telehealth: Payer: Self-pay | Admitting: Family Medicine

## 2019-05-13 ENCOUNTER — Telehealth: Payer: Self-pay | Admitting: *Deleted

## 2019-05-13 NOTE — Telephone Encounter (Signed)
Key: [id number] Sent to plan  For ESOMEPRAZOLE MAG 64m Cap

## 2019-05-13 NOTE — Telephone Encounter (Signed)
Approved today

## 2019-05-16 ENCOUNTER — Ambulatory Visit: Payer: Medicare Other | Admitting: Family Medicine

## 2019-05-21 NOTE — Chronic Care Management (AMB) (Signed)
  Chronic Care Management   PCP Scheduling Note  05/21/2019 Name: [name] MRN: [id number] DOB: May 04, 1945  Primary Care Provider: [name], DO  Penny Howard is a 74 y.o. year old female who is a primary care patient of [name], DO. Please note that SHYLIN KEIZER has not had a PCP visit in the last 12 months and has been referred to care management services through her health plan. The patient needs to be contacted for PCP visit scheduling and has been referred to the appropriate scheduling resource for the practice.   Follow Up Plan: The care coordination team will engage the patient to assist with care management and/or care coordination needs after the PCP visit has been scheduled.   [place], [place] [address] Direct Dial: [phone] [email or web address]

## 2019-08-01 ENCOUNTER — Other Ambulatory Visit: Payer: Self-pay | Admitting: Oncology

## 2019-08-01 ENCOUNTER — Other Ambulatory Visit: Payer: Self-pay | Admitting: Family Medicine

## 2019-08-01 DIAGNOSIS — Z1231 Encounter for screening mammogram for malignant neoplasm of breast: Secondary | ICD-10-CM

## 2019-08-05 ENCOUNTER — Ambulatory Visit (INDEPENDENT_AMBULATORY_CARE_PROVIDER_SITE_OTHER): Payer: Medicare Other | Admitting: Family Medicine

## 2019-08-05 ENCOUNTER — Other Ambulatory Visit: Payer: Self-pay

## 2019-08-05 VITALS — BP 142/79 | HR 78 | Temp 97.7°F | Ht 59.0 in | Wt 133.0 lb

## 2019-08-05 DIAGNOSIS — M2550 Pain in unspecified joint: Secondary | ICD-10-CM

## 2019-08-05 DIAGNOSIS — I6521 Occlusion and stenosis of right carotid artery: Secondary | ICD-10-CM | POA: Diagnosis not present

## 2019-08-05 DIAGNOSIS — C50411 Malignant neoplasm of upper-outer quadrant of right female breast: Secondary | ICD-10-CM | POA: Diagnosis not present

## 2019-08-05 DIAGNOSIS — Z170 Estrogen receptor positive status [ER+]: Secondary | ICD-10-CM

## 2019-08-05 DIAGNOSIS — I10 Essential (primary) hypertension: Secondary | ICD-10-CM

## 2019-08-05 DIAGNOSIS — F411 Generalized anxiety disorder: Secondary | ICD-10-CM

## 2019-08-05 DIAGNOSIS — M8589 Other specified disorders of bone density and structure, multiple sites: Secondary | ICD-10-CM

## 2019-08-05 DIAGNOSIS — M654 Radial styloid tenosynovitis [de Quervain]: Secondary | ICD-10-CM

## 2019-08-05 DIAGNOSIS — E782 Mixed hyperlipidemia: Secondary | ICD-10-CM | POA: Diagnosis not present

## 2019-08-05 DIAGNOSIS — K2100 Gastro-esophageal reflux disease with esophagitis, without bleeding: Secondary | ICD-10-CM

## 2019-08-05 DIAGNOSIS — M65342 Trigger finger, left ring finger: Secondary | ICD-10-CM

## 2019-08-05 DIAGNOSIS — Z23 Encounter for immunization: Secondary | ICD-10-CM | POA: Diagnosis not present

## 2019-08-05 MED ORDER — LISINOPRIL 10 MG PO TABS: 10.0000 mg | ORAL_TABLET | Freq: Every day | ORAL | 3 refills | Status: DC

## 2019-08-05 MED ORDER — ESOMEPRAZOLE MAGNESIUM 20 MG PO CPDR: DELAYED_RELEASE_CAPSULE | ORAL | 3 refills | Status: DC

## 2019-08-05 MED ORDER — ESCITALOPRAM OXALATE 10 MG PO TABS: 10.0000 mg | ORAL_TABLET | Freq: Every day | ORAL | 3 refills | Status: DC

## 2019-08-05 MED ORDER — PRAVASTATIN SODIUM 20 MG PO TABS: 20.0000 mg | ORAL_TABLET | Freq: Every day | ORAL | 3 refills | Status: DC

## 2019-08-05 NOTE — Patient Instructions (Signed)

## 2019-08-05 NOTE — Addendum Note (Signed)
Addended byCarrolyn Leigh on: 08/05/2019 04:01 PM   Modules accepted: Orders

## 2019-08-05 NOTE — Progress Notes (Signed)
 Subjective: CC: Checkup PCP: Gottschalk, Ashly M, DO HPI:Penny Howard is a 73 y.o. female presenting to clinic today for:  1.  Hypertension with hyperlipidemia.   Patient compliant with her medications.  She needs refills.  She does not complain of any chest pain, shortness of breath, lower extremity May, change in exercise tolerance  2.  Wrist pain Patient continues to have left-sided wrist pain though it is somewhat better than previous.  In fact, she thought it improved and canceled her appointment for corticosteroid injection here.  She has been using the meloxicam but has not noticed a great difference with the meloxicam.  She now has a palpable nodule along the wrist.  She also continues to have trigger finger.  She would be interested in getting a steroid shot if possible.  She is right-hand dominant.  She notes that she has tenderness in her hand joints.  In fact they are quite stiff when she wakes up and it is difficult for her to make a fist.  ROS: Per HPI  Allergies  Allergen Reactions  . Codeine Nausea Only   Past Medical History:  Diagnosis Date  . Abnormal glucose tolerance test 05/02/2011  . Allergy   . Anxiety   . Bruit of right carotid artery 07/18/2018  . Cancer ([facility])    breast rt  . Family history of breast cancer   . Family history of leukemia   . Family history of non-Hodgkin's lymphoma   . Family history of pancreatic cancer   . Family history of stomach cancer   . GERD (gastroesophageal reflux disease)   . High cholesterol   . Hypertension   . Liver fibrosis   . Osteoporosis   . Other malaise and fatigue 02/02/2010  . Squamous cell carcinoma   . Stress incontinence   . Unspecified urinary incontinence 11/03/2004  . Wears contact lenses     Current Outpatient Medications:  .  aspirin EC 81 MG tablet, Take 81 mg by mouth daily., Disp: , Rfl:  .  escitalopram (LEXAPRO) 10 MG tablet, Take 1 tablet (10 mg total) by mouth daily., Disp: 90 tablet,  Rfl: 3 .  esomeprazole (NEXIUM) 20 MG capsule, TAKE 1 CAPSULE DAILY AT    NOON, Disp: 90 capsule, Rfl: 3 .  lisinopril (ZESTRIL) 10 MG tablet, Take 1 tablet (10 mg total) by mouth daily., Disp: 90 tablet, Rfl: 3 .  meloxicam (MOBIC) 7.5 MG tablet, Take 1 tablet (7.5 mg total) by mouth daily. With food x2 weeks. Then daily as needed, Disp: 30 tablet, Rfl: 3 .  multivitamin-lutein (OCUVITE-LUTEIN) CAPS capsule, Take 1 capsule by mouth daily. 250 mg capsule, Disp: , Rfl:  .  pravastatin (PRAVACHOL) 20 MG tablet, Take 1 tablet (20 mg total) by mouth daily., Disp: 90 tablet, Rfl: 3 .  triamcinolone (KENALOG) 0.025 % ointment, Apply 1 application topically 2 (two) times daily., Disp: 30 g, Rfl: 0 .  vitamin E (VITAMIN E) 400 UNIT capsule, Take 400 Units by mouth daily., Disp: , Rfl:  Social History   Socioeconomic History  . Marital status: Married    Spouse name: Not on file  . Number of children: 2  . Years of education: 13  . Highest education level: Some college, no degree  Occupational History  . Occupation: retired  Tobacco Use  . Smoking status: Never Smoker  . Smokeless tobacco: Never Used  Vaping Use  . Vaping Use: Never used  Substance and Sexual Activity  . Alcohol use:   Yes    Alcohol/week: 5.0 standard drinks    Types: 5 Glasses of wine per week    Comment: occaionally  . Drug use: No  . Sexual activity: Yes  Other Topics Concern  . Not on file  Social History Narrative  . Not on file   Social Determinants of Health   Financial Resource Strain:   . Difficulty of Paying Living Expenses:   Food Insecurity:   . Worried About Charity fundraiser in the Last Year:   . Arboriculturist in the Last Year:   Transportation Needs:   . Film/video editor (Medical):   Marland Kitchen Lack of Transportation (Non-Medical):   Physical Activity:   . Days of Exercise per Week:   . Minutes of Exercise per Session:   Stress:   . Feeling of Stress :   Social Connections:   . Frequency of  Communication with Friends and Family:   . Frequency of Social Gatherings with Friends and Family:   . Attends Religious Services:   . Active Member of Clubs or Organizations:   . Attends Archivist Meetings:   Marland Kitchen Marital Status:   Intimate Partner Violence:   . Fear of Current or Ex-Partner:   . Emotionally Abused:   Marland Kitchen Physically Abused:   . Sexually Abused:    Family History  Problem Relation Age of Onset  . Breast cancer Mother 5  . Heart failure Father   . Breast cancer Paternal Aunt 50  . Lymphoma Cousin 35       non- hodgkins lymphoma  . Stomach cancer Maternal Uncle 18  . Pancreatic cancer Other   . Leukemia Other   . Colon cancer Neg Hx   . Rectal cancer Neg Hx   . Esophageal cancer Neg Hx   . Liver cancer Neg Hx     Objective: Office vital signs reviewed. BP (!) 142/79   Pulse 78   Temp 97.7 F (36.5 C) (Temporal)   Ht 4' 11" (1.499 m)   Wt 133 lb (60.3 kg)   SpO2 98%   BMI 26.86 kg/m   Physical Examination:  General: Awake, alert, well nourished, No acute distress HEENT: Normal, sclera white, MMM Cardio: regular rate and rhythm Pulm: no wheezes, normal work of breathing on room air Breast: Postsurgical changes noted in the right breast.  Overall though breasts appear symmetric.  No nipple inversion.  No lymphadenopathy.  Right upper inner quadrant with palpable scar tissue that is approximately kidney being in size.  There otherwise no dominant breast masses palpated Extremities: warm, well perfused, No edema, cyanosis or clubbing; +2 pulses bilaterally MSK: Normal gait and station Skin: Some sun damage noted on bilateral shoulders and chest  Assessment/ Plan: 74 y.o. female   1. Benign essential hypertension Well-controlled.  Continue current regimen - CMP14+EGFR - lisinopril (ZESTRIL) 10 MG tablet; Take 1 tablet (10 mg total) by mouth daily.  Dispense: 90 tablet; Refill: 3  2. Mixed hyperlipidemia Check fasting lipid.  Continue  statin - CMP14+EGFR - TSH - Lipid Panel - pravastatin (PRAVACHOL) 20 MG tablet; Take 1 tablet (20 mg total) by mouth daily.  Dispense: 90 tablet; Refill: 3  3. Stenosis of right carotid artery - TSH - Lipid Panel  4. Malignant neoplasm of upper-outer quadrant of right breast in female, estrogen receptor positive ([place]) Breast exam performed today.  Keep appointment for mammogram - CBC with Differential  5. Osteopenia of multiple sites - VITAMIN D 25 Hydroxy (  Vit-D Deficiency, Fractures) - CMP14+EGFR  6. De Quervain's tenosynovitis, left Refractory to oral NSAIDs and now with small palpable cyst.  Referral to orthopedics placed - Ambulatory referral to Orthopedic Surgery  7. Trigger ring finger of left hand Referral to orthopedics for corticosteroid injection - Ambulatory referral to Orthopedic Surgery  8. Anxiety state Stable - escitalopram (LEXAPRO) 10 MG tablet; Take 1 tablet (10 mg total) by mouth daily.  Dispense: 90 tablet; Refill: 3  9. Gastro-esophageal reflux disease with esophagitis Stable - esomeprazole (NEXIUM) 20 MG capsule; TAKE 1 CAPSULE DAILY AT    NOON  Dispense: 90 capsule; Refill: 3  10. Need for pneumococcal vaccination Administered during today's visit  11. Polyarthralgia Collect autoimmune markers.  Rule out autoimmune arthritis - ANA w/Reflex if Positive - C-reactive protein - Rheumatoid factor    Orders Placed This Encounter  Procedures  . VITAMIN D 25 Hydroxy (Vit-D Deficiency, Fractures)  . CMP14+EGFR  . TSH  . Lipid Panel  . CBC with Differential  . ANA w/Reflex if Positive  . C-reactive protein  . Rheumatoid factor  . Ambulatory referral to Orthopedic Surgery    Referral Priority:   Routine    Referral Type:   Surgical    Referral Reason:   Specialty Services Required    Requested Specialty:   Orthopedic Surgery    Number of Visits Requested:   1   Meds ordered this encounter  Medications  . escitalopram (LEXAPRO) 10 MG tablet     Sig: Take 1 tablet (10 mg total) by mouth daily.    Dispense:  90 tablet    Refill:  3  . esomeprazole (NEXIUM) 20 MG capsule    Sig: TAKE 1 CAPSULE DAILY AT    NOON    Dispense:  90 capsule    Refill:  3  . pravastatin (PRAVACHOL) 20 MG tablet    Sig: Take 1 tablet (20 mg total) by mouth daily.    Dispense:  90 tablet    Refill:  3  . lisinopril (ZESTRIL) 10 MG tablet    Sig: Take 1 tablet (10 mg total) by mouth daily.    Dispense:  90 tablet    Refill:  3     Ashly M Gottschalk, DO [facility] [phone]   

## 2019-08-06 LAB — CBC WITH DIFFERENTIAL/PLATELET
Basophils Absolute: 0.1 10*3/uL (ref 0.0–0.2)
Basos: 1 %
EOS (ABSOLUTE): 0.1 10*3/uL (ref 0.0–0.4)
Eos: 3 %
Hematocrit: 39.7 % (ref 34.0–46.6)
Hemoglobin: 13.1 g/dL (ref 11.1–15.9)
Immature Grans (Abs): 0 10*3/uL (ref 0.0–0.1)
Immature Granulocytes: 1 %
Lymphocytes Absolute: 2 10*3/uL (ref 0.7–3.1)
Lymphs: 36 %
MCH: 31.2 pg (ref 26.6–33.0)
MCHC: 33 g/dL (ref 31.5–35.7)
MCV: 95 fL (ref 79–97)
Monocytes Absolute: 0.5 10*3/uL (ref 0.1–0.9)
Monocytes: 9 %
Neutrophils Absolute: 2.8 10*3/uL (ref 1.4–7.0)
Neutrophils: 50 %
Platelets: 279 10*3/uL (ref 150–450)
RBC: 4.2 x10E6/uL (ref 3.77–5.28)
RDW: 12.1 % (ref 11.7–15.4)
WBC: 5.6 10*3/uL (ref 3.4–10.8)

## 2019-08-06 LAB — VITAMIN D 25 HYDROXY (VIT D DEFICIENCY, FRACTURES): Vit D, 25-Hydroxy: 33 ng/mL (ref 30.0–100.0)

## 2019-08-06 LAB — CMP14+EGFR
ALT: 21 IU/L (ref 0–32)
AST: 22 IU/L (ref 0–40)
Albumin/Globulin Ratio: 1.9 (ref 1.2–2.2)
Albumin: 4.9 g/dL — ABNORMAL HIGH (ref 3.7–4.7)
Alkaline Phosphatase: 72 IU/L (ref 48–121)
BUN/Creatinine Ratio: 20 (ref 12–28)
BUN: 14 mg/dL (ref 8–27)
Bilirubin Total: 0.3 mg/dL (ref 0.0–1.2)
CO2: 21 mmol/L (ref 20–29)
Calcium: 10.1 mg/dL (ref 8.7–10.3)
Chloride: 101 mmol/L (ref 96–106)
Creatinine, Ser: 0.71 mg/dL (ref 0.57–1.00)
GFR calc Af Amer: 99 mL/min/{1.73_m2} (ref >59)
GFR calc non Af Amer: 86 mL/min/{1.73_m2} (ref >59)
Globulin, Total: 2.6 g/dL (ref 1.5–4.5)
Glucose: 101 mg/dL — ABNORMAL HIGH (ref 65–99)
Potassium: 4.6 mmol/L (ref 3.5–5.2)
Sodium: 142 mmol/L (ref 134–144)
Total Protein: 7.5 g/dL (ref 6.0–8.5)

## 2019-08-06 LAB — LIPID PANEL
Chol/HDL Ratio: 3.3 ratio (ref 0.0–4.4)
Cholesterol, Total: 186 mg/dL (ref 100–199)
HDL: 56 mg/dL (ref >39)
LDL Chol Calc (NIH): 111 mg/dL — ABNORMAL HIGH (ref 0–99)
Triglycerides: 108 mg/dL (ref 0–149)
VLDL Cholesterol Cal: 19 mg/dL (ref 5–40)

## 2019-08-06 LAB — RHEUMATOID FACTOR: Rheumatoid fact SerPl-aCnc: <10 IU/mL (ref 0.0–13.9)

## 2019-08-06 LAB — TSH: TSH: 2.29 u[IU]/mL (ref 0.450–4.500)

## 2019-08-06 LAB — C-REACTIVE PROTEIN: CRP: 1 mg/L (ref 0–10)

## 2019-08-06 LAB — ANA W/REFLEX IF POSITIVE: Anti Nuclear Antibody (ANA): NEGATIVE

## 2019-08-29 ENCOUNTER — Other Ambulatory Visit: Payer: Self-pay

## 2019-08-29 ENCOUNTER — Ambulatory Visit
Admission: RE | Admit: 2019-08-29 | Discharge: 2019-08-29 | Disposition: A | Discharge status: Home / Self Care | Payer: Medicare Other | Source: Ambulatory Visit | Attending: Family Medicine | Admitting: Family Medicine

## 2019-08-29 DIAGNOSIS — Z1231 Encounter for screening mammogram for malignant neoplasm of breast: Secondary | ICD-10-CM

## 2020-03-04 DIAGNOSIS — Z23 Encounter for immunization: Secondary | ICD-10-CM | POA: Diagnosis not present

## 2020-05-18 ENCOUNTER — Telehealth: Payer: Self-pay | Admitting: *Deleted

## 2020-05-18 NOTE — Telephone Encounter (Signed)
[employer] prior authorization request Esomeprazole 20 mg cap. Form completed and faxed.

## 2020-05-20 NOTE — Telephone Encounter (Signed)
[employer] approval came in today  Good from 04/19/20-05/19/21.

## 2020-07-27 ENCOUNTER — Other Ambulatory Visit: Payer: Self-pay | Admitting: Family Medicine

## 2020-07-27 DIAGNOSIS — Z1231 Encounter for screening mammogram for malignant neoplasm of breast: Secondary | ICD-10-CM

## 2020-09-20 ENCOUNTER — Ambulatory Visit (INDEPENDENT_AMBULATORY_CARE_PROVIDER_SITE_OTHER): Payer: Medicare Other | Admitting: Family Medicine

## 2020-09-20 ENCOUNTER — Other Ambulatory Visit: Payer: Self-pay

## 2020-09-20 ENCOUNTER — Encounter: Payer: Self-pay | Admitting: Family Medicine

## 2020-09-20 VITALS — BP 123/78 | HR 73 | Temp 97.9°F | Ht 59.0 in | Wt 132.2 lb

## 2020-09-20 DIAGNOSIS — E782 Mixed hyperlipidemia: Secondary | ICD-10-CM

## 2020-09-20 DIAGNOSIS — F411 Generalized anxiety disorder: Secondary | ICD-10-CM

## 2020-09-20 DIAGNOSIS — Z23 Encounter for immunization: Secondary | ICD-10-CM

## 2020-09-20 DIAGNOSIS — I10 Essential (primary) hypertension: Secondary | ICD-10-CM

## 2020-09-20 DIAGNOSIS — I6521 Occlusion and stenosis of right carotid artery: Secondary | ICD-10-CM

## 2020-09-20 DIAGNOSIS — M8589 Other specified disorders of bone density and structure, multiple sites: Secondary | ICD-10-CM | POA: Diagnosis not present

## 2020-09-20 DIAGNOSIS — Z853 Personal history of malignant neoplasm of breast: Secondary | ICD-10-CM | POA: Diagnosis not present

## 2020-09-20 DIAGNOSIS — K2100 Gastro-esophageal reflux disease with esophagitis, without bleeding: Secondary | ICD-10-CM | POA: Diagnosis not present

## 2020-09-20 MED ORDER — PRAVASTATIN SODIUM 20 MG PO TABS: 20.0000 mg | ORAL_TABLET | Freq: Every day | ORAL | 3 refills | Status: DC

## 2020-09-20 MED ORDER — ESOMEPRAZOLE MAGNESIUM 20 MG PO CPDR: DELAYED_RELEASE_CAPSULE | ORAL | 3 refills | Status: DC

## 2020-09-20 MED ORDER — ESCITALOPRAM OXALATE 10 MG PO TABS: 10.0000 mg | ORAL_TABLET | Freq: Every day | ORAL | 3 refills | Status: DC

## 2020-09-20 MED ORDER — LISINOPRIL 10 MG PO TABS: 10.0000 mg | ORAL_TABLET | Freq: Every day | ORAL | 3 refills | Status: DC

## 2020-09-20 NOTE — Progress Notes (Signed)
Subjective: CC: Chronic follow-up for hypertension, hyperlipidemia, anxiety disorder PCP: [name], DO JYN:WGNFA T Behne is a 75 y.o. female presenting to clinic today for:  1.  Hypertension with hyperlipidemia and known right-sided carotid artery stenosis Patient reports compliance with lisinopril, Pravachol.  Last carotid Doppler was performed in July 2020 and was stable from previous checkup in 2018.  She does report occasional right-sided headache and worries that this will be a "stroke" from her carotid artery stenosis.  She otherwise not complain of any change in vision, sensation or balance.  No chest pain, shortness of breath or change in exercise tolerance.  She has a history of NASH and wants to make sure we get her liver function test today.  Not fasting for lipid panel.  2.  Anxiety, insomnia Patient reports good control of symptoms with Lexapro but notes that sometimes she does have difficulty falling asleep.  She utilizes melatonin and Benadryl occasionally and wants to make sure that this is okay  3.  Left wrist pain Patient with known de Quervain's tenosynovitis in the left wrist.  She has been under the care of Dr. Apolonio Schneiders and is status post 2 injections in the wrist and 2 injections in the [facility].  She continues to have pain intermittently with certain movements.  She has not yet followed up with him.  4.  History of breast cancer on the right Patient with history of right-sided breast cancer that was estrogen receptor positive.  She is status post treatment of 5 years with tamoxifen and has had reconstructive surgery on that side.  She would like to get her yearly breast exam today.  Has mammogram scheduled for Wednesday.   ROS: Per HPI  Allergies  Allergen Reactions   Codeine Nausea Only   Past Medical History:  Diagnosis Date   Abnormal glucose tolerance test 05/02/2011   Allergy    Anxiety    Bruit of right carotid artery 07/18/2018   Cancer ([place])     breast rt   Family history of breast cancer    Family history of leukemia    Family history of non-Hodgkin's lymphoma    Family history of pancreatic cancer    Family history of stomach cancer    GERD (gastroesophageal reflux disease)    High cholesterol    Hypertension    Liver fibrosis    Osteoporosis    Other malaise and fatigue 02/02/2010   Squamous cell carcinoma    Stress incontinence    Unspecified urinary incontinence 11/03/2004   Wears contact lenses     Current Outpatient Medications:    aspirin EC 81 MG tablet, Take 81 mg by mouth daily., Disp: , Rfl:    escitalopram (LEXAPRO) 10 MG tablet, Take 1 tablet (10 mg total) by mouth daily., Disp: 90 tablet, Rfl: 3   esomeprazole (NEXIUM) 20 MG capsule, TAKE 1 CAPSULE DAILY AT    NOON, Disp: 90 capsule, Rfl: 3   lisinopril (ZESTRIL) 10 MG tablet, Take 1 tablet (10 mg total) by mouth daily., Disp: 90 tablet, Rfl: 3   multivitamin-lutein (OCUVITE-LUTEIN) CAPS capsule, Take 1 capsule by mouth daily. 250 mg capsule, Disp: , Rfl:    pravastatin (PRAVACHOL) 20 MG tablet, Take 1 tablet (20 mg total) by mouth daily., Disp: 90 tablet, Rfl: 3   triamcinolone (KENALOG) 0.025 % ointment, Apply 1 application topically 2 (two) times daily., Disp: 30 g, Rfl: 0   vitamin E (VITAMIN E) 400 UNIT capsule, Take 400 Units by  mouth daily., Disp: , Rfl:  Social History   Socioeconomic History   Marital status: Married    Spouse name: Not on file   Number of children: 2   Years of education: 13   Highest education level: Some college, no degree  Occupational History   Occupation: retired  Tobacco Use   Smoking status: Never   Smokeless tobacco: Never  Vaping Use   Vaping Use: Never used  Substance and Sexual Activity   Alcohol use: Yes    Alcohol/week: 5.0 standard drinks    Types: 5 Glasses of wine per week    Comment: occaionally   Drug use: No   Sexual activity: Yes  Other Topics Concern   Not on file  Social History Narrative   Not  on file   Social Determinants of Health   Financial Resource Strain: Not on file  Food Insecurity: Not on file  Transportation Needs: Not on file  Physical Activity: Not on file  Stress: Not on file  Social Connections: Not on file  Intimate Partner Violence: Not on file   Family History  Problem Relation Age of Onset   Breast cancer Mother 56   Heart failure Father    Breast cancer Paternal Aunt 75   Lymphoma Cousin 35       non- hodgkins lymphoma   Stomach cancer Maternal Uncle 18   Pancreatic cancer Other    Leukemia Other    Colon cancer Neg Hx    Rectal cancer Neg Hx    Esophageal cancer Neg Hx    Liver cancer Neg Hx     Objective: Office vital signs reviewed. BP 123/78   Pulse 73   Temp 97.9 F (36.6 C)   Ht _0  (1.499 m)   Wt 132 lb 3.2 oz (60 kg)   SpO2 96%   BMI 26.70 kg/m   Physical Examination:  General: Awake, alert, well nourished, well appearing female.  Appears younger than stated age.  No acute distress HEENT: Normal    Neck: No masses palpated. No lymphadenopathy.  Prominent fat pad noted in the superior clavicular spaces bilaterally.  Right carotid bruit noted    Ears: Tympanic membranes intact, normal light reflex, no erythema, no bulging    Eyes: PERRLA, extraocular membranes intact, sclera white    Nose: nasal turbinates moist, no nasal discharge    Throat: moist mucus membranes, no erythema, no tonsillar exudate.  Airway is patent Breast: Evidence of prior surgical changes noted in the right breast.  She has about a kidney bean sized mass noted at the 12 o'clock position in the right breast that is chronic and stable.  Nontender. Cardio: regular rate and rhythm, S1S2 heard, no murmurs appreciated Pulm: clear to auscultation bilaterally, no wheezes, rhonchi or rales; normal work of breathing on room air GI: soft, non-tender, non-distended, bowel sounds present x4, no hepatomegaly, no splenomegaly, no masses Extremities: warm, well perfused,  No edema, cyanosis or clubbing; +2 pulses bilaterally MSK: normal gait and station Skin: Healing abrasion noted along the upper extremity.  She has skin damage noted from sun with prominent freckling Neuro:  patellar DTRs 2/4  Assessment/ Plan: 75 y.o. female   Benign essential hypertension - Plan: CMP14+EGFR, lisinopril (ZESTRIL) 10 MG tablet, CMP14+EGFR  Mixed hyperlipidemia - Plan: CMP14+EGFR, Lipid panel, TSH, pravastatin (PRAVACHOL) 20 MG tablet, TSH, CMP14+EGFR  Stenosis of right carotid artery - Plan: CMP14+EGFR, Lipid panel, TSH, US Carotid Duplex Bilateral, TSH, CMP14+EGFR  Osteopenia of multiple sites -  Plan: TSH, TSH  History of cancer of right breast - Plan: CBC, CBC  Anxiety state - Plan: escitalopram (LEXAPRO) 10 MG tablet  Gastro-esophageal reflux disease with esophagitis - Plan: esomeprazole (NEXIUM) 20 MG capsule  Blood pressure under excellent control.  No changes needed.  Check CMP.  Lisinopril renewed  Nonfasting lab today.  Lipid panel planned.  Continue Pravachol, blood pressure control  Continued right-sided carotid artery bruit.  Last carotid duplex was in 2020.  This has been reordered.  She occasionally has headaches but otherwise remains asymptomatic  Check TSH.  DEXA was unavailable today since has not obtained  Breast exam performed today.  No new dominant masses.  She has a chronic and stable soft tissue mass status post surgical resection of the right breast noted at the 12 o'clock position of the superior breast. Keep mammogram appointment  Lexapro is controlling anxiety well.  Renewal sent  GERD stable with Nexium.  Renewal has been sent  Shingles vaccination administered today  Keep appointment with dermatology for full body skin exam in October.  No orders of the defined types were placed in this encounter.  No orders of the defined types were placed in this encounter.    [name], DO [facility] [phone]

## 2020-09-20 NOTE — Patient Instructions (Signed)
You had labs performed today.  You will be contacted with the results of the labs once they are available, usually in the next 3 business days for routine lab work.  If you have an active my chart account, they will be released to your MyChart.  If you prefer to have these labs released to you via telephone, please let us know.  Come in for fasting cholesterol at your convenience.  Ultrasound ordered for carotid artery follow up  Melatonin ok up to 68m at bedtime if needed  Preventive Care 65 Years and Older, Female Preventive care refers to lifestyle choices and visits with your health care provider that can promote health and wellness. This includes: A yearly physical exam. This is also called an annual wellness visit. Regular dental and eye exams. Immunizations. Screening for certain conditions. Healthy lifestyle choices, such as: Eating a healthy diet. Getting regular exercise. Not using drugs or products that contain nicotine and tobacco. Limiting alcohol use. What can I expect for my preventive care visit? Physical exam Your health care provider will check your: Height and weight. These may be used to calculate your BMI (body mass index). BMI is a measurement that tells if you are at a healthy weight. Heart rate and blood pressure. Body temperature. Skin for abnormal spots. Counseling Your health care provider may ask you questions about your: Past medical problems. Family's medical history. Alcohol, tobacco, and drug use. Emotional well-being. Home life and relationship well-being. Sexual activity. Diet, exercise, and sleep habits. History of falls. Memory and ability to understand (cognition). Work and work eStatistician Pregnancy and menstrual history. Access to firearms. What immunizations do I need?  Vaccines are usually given at various ages, according to a schedule. Your health care provider will recommend vaccines for you based on your age, medicalhistory, and  lifestyle or other factors, such as travel or where you work. What tests do I need? Blood tests Lipid and cholesterol levels. These may be checked every 5 years, or more often depending on your overall health. Hepatitis C test. Hepatitis B test. Screening Lung cancer screening. You may have this screening every year starting at age 27259if you have a 30-pack-year history of smoking and currently smoke or have quit within the past 15 years. Colorectal cancer screening. All adults should have this screening starting at age 27228and continuing until age 73 Your health care provider may recommend screening at age 2751if you are at increased risk. You will have tests every 1-10 years, depending on your results and the type of screening test. Diabetes screening. This is done by checking your blood sugar (glucose) after you have not eaten for a while (fasting). You may have this done every 1-3 years. Mammogram. This may be done every 1-2 years. Talk with your health care provider about how often you should have regular mammograms. Abdominal aortic aneurysm (AAA) screening. You may need this if you are a current or former smoker. BRCA-related cancer screening. This may be done if you have a family history of breast, ovarian, tubal, or peritoneal cancers. Other tests STD (sexually transmitted disease) testing, if you are at risk. Bone density scan. This is done to screen for osteoporosis. You may have this done starting at age 73 Talk with your health care provider about your test results, treatment options,and if necessary, the need for more tests. Follow these instructions at home: Eating and drinking  Eat a diet that includes fresh fruits and vegetables, whole grains, lean protein, and  low-fat dairy products. Limit your intake of foods with high amounts of sugar, saturated fats, and salt. Take vitamin and mineral supplements as recommended by your health care provider. Do not drink alcohol if  your health care provider tells you not to drink. If you drink alcohol: Limit how much you have to 0-1 drink a day. Be aware of how much alcohol is in your drink. In the [place] one drink equals one 12 oz bottle of beer (355 mL), one 5 oz glass of wine (148 mL), or one 1 oz glass of hard liquor (44 mL).  Lifestyle Take daily care of your teeth and gums. Brush your teeth every morning and night with fluoride toothpaste. Floss one time each day. Stay active. Exercise for at least 30 minutes 5 or more days each week. Do not use any products that contain nicotine or tobacco, such as cigarettes, e-cigarettes, and chewing tobacco. If you need help quitting, ask your health care provider. Do not use drugs. If you are sexually active, practice safe sex. Use a condom or other form of protection in order to prevent STIs (sexually transmitted infections). Talk with your health care provider about taking a low-dose aspirin or statin. Find healthy ways to cope with stress, such as: Meditation, yoga, or listening to music. Journaling. Talking to a trusted person. Spending time with friends and family. Safety Always wear your seat belt while driving or riding in a vehicle. Do not drive: If you have been drinking alcohol. Do not ride with someone who has been drinking. When you are tired or distracted. While texting. Wear a helmet and other protective equipment during sports activities. If you have firearms in your house, make sure you follow all gun safety procedures. What's next? Visit your health care provider once a year for an annual wellness visit. Ask your health care provider how often you should have your eyes and teeth checked. Stay up to date on all vaccines. This information is not intended to replace advice given to you by your health care provider. Make sure you discuss any questions you have with your healthcare provider. Document Revised: 01/28/2020 Document Reviewed:  01/31/2018 Elsevier Patient Education  2022 [employer].

## 2020-09-21 LAB — CBC
Hematocrit: 41.1 % (ref 34.0–46.6)
Hemoglobin: 13.5 g/dL (ref 11.1–15.9)
MCH: 30.8 pg (ref 26.6–33.0)
MCHC: 32.8 g/dL (ref 31.5–35.7)
MCV: 94 fL (ref 79–97)
Platelets: 327 10*3/uL (ref 150–450)
RBC: 4.38 x10E6/uL (ref 3.77–5.28)
RDW: 12.5 % (ref 11.7–15.4)
WBC: 8.3 10*3/uL (ref 3.4–10.8)

## 2020-09-21 LAB — CMP14+EGFR
ALT: 25 IU/L (ref 0–32)
AST: 25 IU/L (ref 0–40)
Albumin/Globulin Ratio: 2.1 (ref 1.2–2.2)
Albumin: 5 g/dL — ABNORMAL HIGH (ref 3.7–4.7)
Alkaline Phosphatase: 68 IU/L (ref 44–121)
BUN/Creatinine Ratio: 21 (ref 12–28)
BUN: 17 mg/dL (ref 8–27)
Bilirubin Total: 0.3 mg/dL (ref 0.0–1.2)
CO2: 20 mmol/L (ref 20–29)
Calcium: 10.2 mg/dL (ref 8.7–10.3)
Chloride: 99 mmol/L (ref 96–106)
Creatinine, Ser: 0.8 mg/dL (ref 0.57–1.00)
Globulin, Total: 2.4 g/dL (ref 1.5–4.5)
Glucose: 104 mg/dL — ABNORMAL HIGH (ref 65–99)
Potassium: 4.8 mmol/L (ref 3.5–5.2)
Sodium: 138 mmol/L (ref 134–144)
Total Protein: 7.4 g/dL (ref 6.0–8.5)
eGFR: 78 mL/min/{1.73_m2} (ref >59)

## 2020-09-21 LAB — TSH: TSH: 2.24 u[IU]/mL (ref 0.450–4.500)

## 2020-09-22 ENCOUNTER — Ambulatory Visit
Admission: RE | Admit: 2020-09-22 | Discharge: 2020-09-22 | Disposition: A | Discharge status: Home / Self Care | Payer: Medicare Other | Source: Ambulatory Visit | Attending: Family Medicine | Admitting: Family Medicine

## 2020-09-22 ENCOUNTER — Other Ambulatory Visit: Payer: Self-pay

## 2020-09-22 DIAGNOSIS — Z1231 Encounter for screening mammogram for malignant neoplasm of breast: Secondary | ICD-10-CM

## 2020-09-23 ENCOUNTER — Telehealth: Payer: Self-pay | Admitting: Family Medicine

## 2020-09-27 ENCOUNTER — Other Ambulatory Visit: Payer: Self-pay

## 2020-09-27 ENCOUNTER — Ambulatory Visit (HOSPITAL_COMMUNITY)
Admission: RE | Admit: 2020-09-27 | Discharge: 2020-09-27 | Disposition: A | Discharge status: Home / Self Care | Payer: Medicare Other | Source: Ambulatory Visit | Attending: Family Medicine | Admitting: Family Medicine

## 2020-09-27 DIAGNOSIS — I771 Stricture of artery: Secondary | ICD-10-CM | POA: Diagnosis not present

## 2020-09-27 DIAGNOSIS — I6523 Occlusion and stenosis of bilateral carotid arteries: Secondary | ICD-10-CM | POA: Diagnosis not present

## 2020-09-27 DIAGNOSIS — I6521 Occlusion and stenosis of right carotid artery: Secondary | ICD-10-CM | POA: Diagnosis not present

## 2020-12-02 ENCOUNTER — Telehealth: Payer: Self-pay | Admitting: Family Medicine

## 2020-12-02 NOTE — Telephone Encounter (Signed)
Left message for patient to call back and schedule Medicare Annual Wellness Visit (AWV) to be completed by video or phone.   Last AWV: 07/22/2018  Please schedule at anytime with [facility] Health Advisor.  45 minute appointment  Any questions, please contact me at [phone]

## 2020-12-20 DIAGNOSIS — Z85828 Personal history of other malignant neoplasm of skin: Secondary | ICD-10-CM | POA: Diagnosis not present

## 2020-12-20 DIAGNOSIS — L570 Actinic keratosis: Secondary | ICD-10-CM | POA: Diagnosis not present

## 2020-12-20 DIAGNOSIS — L814 Other melanin hyperpigmentation: Secondary | ICD-10-CM | POA: Diagnosis not present

## 2020-12-20 DIAGNOSIS — I781 Nevus, non-neoplastic: Secondary | ICD-10-CM | POA: Diagnosis not present

## 2020-12-20 DIAGNOSIS — L821 Other seborrheic keratosis: Secondary | ICD-10-CM | POA: Diagnosis not present

## 2021-01-10 ENCOUNTER — Ambulatory Visit (INDEPENDENT_AMBULATORY_CARE_PROVIDER_SITE_OTHER): Payer: Medicare Other | Admitting: *Deleted

## 2021-01-10 DIAGNOSIS — Z0000 Encounter for general adult medical examination without abnormal findings: Secondary | ICD-10-CM | POA: Diagnosis not present

## 2021-01-10 NOTE — Progress Notes (Signed)
MEDICARE ANNUAL WELLNESS VISIT  01/10/2021  Telephone Visit Disclaimer This Medicare AWV was conducted by telephone due to national recommendations for restrictions regarding the COVID-19 Pandemic (e.g. social distancing).  I verified, using two identifiers, that I am speaking with [name] or their authorized healthcare agent. I discussed the limitations, risks, security, and privacy concerns of performing an evaluation and management service by telephone and the potential availability of an in-person appointment in the future. The patient expressed understanding and agreed to proceed.  Location of Patient: home Location of Provider (nurse):  in office  Subjective:    JANDI SWIGER is a 75 y.o. female patient of [name], DO who had a Medicare Annual Wellness Visit today via telephone. Milly is Retired and lives with their spouse. she has 2 children. she reports that she is socially active and does interact with friends/family regularly. she is markedly physically active and enjoys playing pickle ball, going to the gym and working in her flowers.  Patient Care Team: [name], DO as PCP - General (Family Medicine) Juanita Craver, MD as Consulting Physician (Gastroenterology) Michael Boston, MD as Consulting Physician (General Surgery) Mauri Pole, MD as Consulting Physician (Gastroenterology) Magrinat, Virgie Dad, MD as Consulting Physician (Oncology) Irene Limbo, MD as Consulting Physician (Plastic Surgery)  Advanced Directives 01/10/2021 07/22/2018 07/27/2015 07/14/2014 04/24/2014 04/16/2014 12/31/2013  Does Patient Have a Medical Advance Directive? No No No No - No No  Would patient like information on creating a medical advance directive? No - Patient declined Yes (MAU/Ambulatory/Procedural Areas - Information given) - - No - patient declined information Yes - Educational materials given No - patient declined information  Pre-existing out of  facility DNR order (yellow form or pink MOST form) - - - - - - -    Hospital Utilization Over the Past 12 Months: # of hospitalizations or ER visits: 0 # of surgeries: 0  Review of Systems    Patient reports that her overall health is unchanged compared to last year.  History obtained from chart review and the patient  Patient Reported Readings (BP, Pulse, CBG, Weight, etc) none  Pain Assessment Pain : No/denies pain     Current Medications & Allergies (verified) Allergies as of 01/10/2021       Reactions   Codeine Nausea Only        Medication List        Accurate as of January 10, 2021 10:22 AM. If you have any questions, ask your nurse or doctor.          STOP taking these medications    triamcinolone 0.025 % ointment Commonly known as: KENALOG       TAKE these medications    aspirin EC 81 MG tablet Take 81 mg by mouth daily.   escitalopram 10 MG tablet Commonly known as: LEXAPRO Take 1 tablet (10 mg total) by mouth daily.   esomeprazole 20 MG capsule Commonly known as: NEXIUM TAKE 1 CAPSULE DAILY AT    NOON   lisinopril 10 MG tablet Commonly known as: ZESTRIL Take 1 tablet (10 mg total) by mouth daily.   multivitamin-lutein Caps capsule Take 1 capsule by mouth daily. 250 mg capsule   pravastatin 20 MG tablet Commonly known as: PRAVACHOL Take 1 tablet (20 mg total) by mouth daily.   vitamin E 180 MG (400 UNITS) capsule Take 400 Units by mouth daily.        History (reviewed): Past Medical History:  Diagnosis Date   Abnormal glucose tolerance test 05/02/2011   Allergy    Anxiety    Bruit of right carotid artery 07/18/2018   Cancer ([place])    breast rt   Family history of breast cancer    Family history of leukemia    Family history of non-Hodgkin's lymphoma    Family history of pancreatic cancer    Family history of stomach cancer    GERD (gastroesophageal reflux disease)    High cholesterol    Hypertension    Liver  fibrosis    Osteoporosis    Other malaise and fatigue 02/02/2010   Squamous cell carcinoma    Stress incontinence    Unspecified urinary incontinence 11/03/2004   Wears contact lenses    Past Surgical History:  Procedure Laterality Date   ABDOMINAL HYSTERECTOMY  1990   ovaries intact b/l   BLADDER SURGERY  10   mesh   COLONOSCOPY     LIPOSUCTION WITH LIPOFILLING Right 04/24/2014   Procedure: LIPOFILLING TO RIGHT CHEST/RIGHT NIPPLE AREOLA COMPLEX CREATION WITH FULL THICKNESS SKIN GRAFT/REVISION OF RIGHT TRANSFLAP;  Surgeon: Irene Limbo, MD;  Location: [place];  Service: Plastics;  Laterality: Right;   MASTECTOMY Right 07/04/2013   MASTECTOMY W/ SENTINEL NODE BIOPSY  07/04/2013   RT TOTAL            DR Excell Seltzer   SCAR REVISION Right 04/24/2014   Procedure: SCAR REVISION;  Surgeon: Irene Limbo, MD;  Location: [place];  Service: Plastics;  Laterality: Right;   SIMPLE MASTECTOMY WITH AXILLARY SENTINEL NODE BIOPSY Right 07/04/2013   Procedure: RIGHT TOTAL MASTECTOMY WITH RIGHT  AXILLARY SENTINEL LYMPH NODE BIOPSY;  Surgeon: Edward Jolly, MD;  Location: [place];  Service: General;  Laterality: Right;   SKIN FULL THICKNESS GRAFT Right 04/24/2014   Procedure: SKIN GRAFT FULL THICKNESS;  Surgeon: Irene Limbo, MD;  Location: [place];  Service: Plastics;  Laterality: Right;   SQUAMOUS CELL CARCINOMA EXCISION Right    Lower Leg   TRAM Right 12/30/2013   Procedure: TRANSVERSE RECTUS ABDOMINIS MYOCUTANEOUS (TRAM) FLAP FOR RIGHT BREAST RECONSTRUCTION;  Surgeon: Irene Limbo, MD;  Location: [place];  Service: Plastics;  Laterality: Right;   Family History  Problem Relation Age of Onset   Breast cancer Mother 33   Heart failure Father    Breast cancer Paternal Aunt 64   Lymphoma Cousin 68       non- hodgkins lymphoma   Stomach cancer Maternal Uncle 18   Pancreatic cancer Other    Leukemia Other    Colon cancer Neg Hx    Rectal  cancer Neg Hx    Esophageal cancer Neg Hx    Liver cancer Neg Hx    Social History   Socioeconomic History   Marital status: Married    Spouse name: Not on file   Number of children: 2   Years of education: 13   Highest education level: Some college, no degree  Occupational History   Occupation: retired  Tobacco Use   Smoking status: Never   Smokeless tobacco: Never  Vaping Use   Vaping Use: Never used  Substance and Sexual Activity   Alcohol use: Yes    Alcohol/week: 5.0 standard drinks    Types: 5 Glasses of wine per week    Comment: occaionally   Drug use: No   Sexual activity: Yes  Other Topics Concern   Not on file  Social History Narrative  Not on file   Social Determinants of Health   Financial Resource Strain: Low Risk    Difficulty of Paying Living Expenses: Not hard at all  Food Insecurity: No Food Insecurity   Worried About Charity fundraiser in the Last Year: Never true   [place] in the Last Year: Never true  Transportation Needs: No Transportation Needs   Lack of Transportation (Medical): No   Lack of Transportation (Non-Medical): No  Physical Activity: Sufficiently Active   Days of Exercise per Week: 3 days   Minutes of Exercise per Session: 60 min  Stress: No Stress Concern Present   Feeling of Stress : Not at all  Social Connections: Socially Integrated   Frequency of Communication with Friends and Family: More than three times a week   Frequency of Social Gatherings with Friends and Family: More than three times a week   Attends Religious Services: More than 4 times per year   Active Member of [employer] or Organizations: Yes   Attends Archivist Meetings: More than 4 times per year   Marital Status: Married    Activities of Daily Living In your present state of health, do you have any difficulty performing the following activities: 01/10/2021  Hearing? Y  Comment wears hearing aids  Vision? N  Difficulty concentrating or  making decisions? N  Walking or climbing stairs? N  Dressing or bathing? N  Doing errands, shopping? N  Preparing Food and eating ? N  Using the Toilet? N  In the past six months, have you accidently leaked urine? N  Do you have problems with loss of bowel control? N  Managing your Medications? N  Managing your Finances? N  Housekeeping or managing your Housekeeping? N  Some recent data might be hidden    Patient Education/ Literacy How often do you need to have someone help you when you read instructions, pamphlets, or other written materials from your doctor or pharmacy?: 1 - Never What is the last grade level you completed in school?: 1 year of college-no degree  Exercise Current Exercise Habits: Home exercise routine, Type of exercise: Other - see comments (pickle ball, gym exercise class), Time (Minutes): 60, Frequency (Times/Week): 3, Weekly Exercise (Minutes/Week): 180, Intensity: Moderate, Exercise limited by: None identified  Diet Patient reports consuming 3 meals a day and 0 snack(s) a day Patient reports that her primary diet is: Regular Patient reports that she does have regular access to food.   Depression Screen PHQ 2/9 Scores 01/10/2021 09/20/2020 08/05/2019 04/30/2019 07/22/2018 07/18/2018 02/08/2018  PHQ - 2 Score 0 0 0 0 0 0 0  PHQ- 9 Score - - 0 0 - 0 -     Fall Risk Fall Risk  01/10/2021 09/20/2020 08/05/2019 04/30/2019 07/22/2018  Falls in the past year? 0 0 0 0 0  Number falls in past yr: - - - - -  Injury with Fall? - - - - -     Objective:  [name] seemed alert and oriented and she participated appropriately during our telephone visit.  Blood Pressure Weight BMI  BP Readings from Last 3 Encounters:  09/20/20 123/78  08/05/19 (!) 142/79  04/30/19 115/70   Wt Readings from Last 3 Encounters:  09/20/20 132 lb 3.2 oz (60 kg)  08/05/19 133 lb (60.3 kg)  04/30/19 133 lb (60.3 kg)   BMI Readings from Last 1 Encounters:  09/20/20 26.70 kg/m     *Unable to obtain current vital  signs, weight, and BMI due to telephone visit type  Hearing/Vision  Nekia did not seem to have difficulty with hearing/understanding during the telephone conversation Reports that she has not had a formal eye exam by an eye care professional within the past year Reports that she has had a formal hearing evaluation within the past year *Unable to fully assess hearing and vision during telephone visit type  Cognitive Function: 6CIT Screen 01/10/2021 07/22/2018  What Year? 0 points 0 points  What month? 0 points 0 points  What time? 0 points 0 points  Count back from 20 0 points 0 points  Months in reverse 0 points 0 points  Repeat phrase 0 points 0 points  Total Score 0 0   (Normal:0-7, Significant for Dysfunction: >8)  Normal Cognitive Function Screening: Yes   Immunization & Health Maintenance Record Immunization History  Administered Date(s) Administered   Fluad Quad(high Dose 65+) 01/09/2019   Influenza, High Dose Seasonal PF 02/08/2018   Influenza-Unspecified 12/14/2005   Moderna Sars-Covid-2 Vaccination 03/24/2019, 04/21/2019   Pneumococcal Conjugate-13 04/04/2017   Pneumococcal Polysaccharide-23 08/05/2019   Tdap 02/02/2010   Zoster Recombinat (Shingrix) 09/20/2020    Health Maintenance  Topic Date Due   COVID-19 Vaccine (3 - Moderna risk series) 05/19/2019   INFLUENZA VACCINE  09/20/2020   Zoster Vaccines- Shingrix (2 of 2) 11/15/2020   TETANUS/TDAP  09/20/2021 (Originally 02/03/2020)   MAMMOGRAM  09/23/2022   COLONOSCOPY (Pts 45-8yr Insurance coverage will need to be confirmed)  10/26/2025   Pneumonia Vaccine 75 Years old  Completed   DEXA SCAN  Completed   Hepatitis C Screening  Completed   HPV VACCINES  Aged Out       Assessment  This is a routine wellness examination for [employer]  Health Maintenance: Due or Overdue Health Maintenance Due  Topic Date Due   COVID-19 Vaccine (3 - Moderna risk series)  05/19/2019   INFLUENZA VACCINE  09/20/2020   Zoster Vaccines- Shingrix (2 of 2) 11/15/2020    GMarquis Lunchdoes not need a referral for Community Assistance: Care Management:   no Social Work:    no Prescription Assistance:  no Nutrition/Diabetes Education:  no   Plan:  Personalized Goals  Goals Addressed             This Visit's Progress    DIET - INCREASE WATER INTAKE         Personalized Health Maintenance & Screening Recommendations  Influenza vaccine Advanced directives: has NO advanced directive - not interested in additional information  Lung Cancer Screening Recommended: not applicable (Low Dose CT Chest recommended if Age 73-80years, 30 pack-year currently smoking OR have quit w/in past 15 years) Hepatitis C Screening recommended: no HIV Screening recommended: no  Advanced Directives: Written information was not prepared per patient's request.  Referrals & Orders No orders of the defined types were placed in this encounter.   Follow-up Plan Follow-up with GJanora Norlander DO as planned Keep your appointment with Triage Nurse for your flu shot as scheduled   I have personally reviewed and noted the following in the patient's chart:   Medical and social history Use of alcohol, tobacco or illicit drugs  Current medications and supplements Functional ability and status Nutritional status Physical activity Advanced directives List of other physicians Hospitalizations, surgeries, and ER visits in previous 12 months Vitals Screenings to include cognitive, depression, and falls Referrals and appointments  In addition, I have reviewed and discussed with GEdd FabianT  Maiers certain preventive protocols, quality metrics, and best practice recommendations. A written personalized care plan for preventive services as well as general preventive health recommendations is available and can be mailed to the patient at her request.      Milas Hock,  LPN  [id number]

## 2021-01-19 ENCOUNTER — Ambulatory Visit (INDEPENDENT_AMBULATORY_CARE_PROVIDER_SITE_OTHER): Payer: Medicare Other | Admitting: *Deleted

## 2021-01-19 DIAGNOSIS — Z23 Encounter for immunization: Secondary | ICD-10-CM | POA: Diagnosis not present

## 2021-01-31 ENCOUNTER — Telehealth: Payer: Self-pay | Admitting: Family Medicine

## 2021-01-31 ENCOUNTER — Other Ambulatory Visit: Payer: Self-pay | Admitting: Family Medicine

## 2021-01-31 DIAGNOSIS — F5101 Primary insomnia: Secondary | ICD-10-CM

## 2021-01-31 MED ORDER — TRAZODONE HCL 50 MG PO TABS: 25.0000 mg | ORAL_TABLET | Freq: Every evening | ORAL | 3 refills | Status: DC | PRN

## 2021-03-23 ENCOUNTER — Encounter: Payer: Self-pay | Admitting: Family Medicine

## 2021-03-23 ENCOUNTER — Ambulatory Visit (INDEPENDENT_AMBULATORY_CARE_PROVIDER_SITE_OTHER): Payer: Medicare Other | Admitting: Family Medicine

## 2021-03-23 VITALS — BP 138/79 | HR 89 | Temp 97.9°F | Ht 59.0 in | Wt 134.8 lb

## 2021-03-23 DIAGNOSIS — Z23 Encounter for immunization: Secondary | ICD-10-CM | POA: Diagnosis not present

## 2021-03-23 DIAGNOSIS — K5909 Other constipation: Secondary | ICD-10-CM | POA: Diagnosis not present

## 2021-03-23 DIAGNOSIS — I6521 Occlusion and stenosis of right carotid artery: Secondary | ICD-10-CM

## 2021-03-23 DIAGNOSIS — I10 Essential (primary) hypertension: Secondary | ICD-10-CM | POA: Diagnosis not present

## 2021-03-23 DIAGNOSIS — E782 Mixed hyperlipidemia: Secondary | ICD-10-CM

## 2021-03-23 MED ORDER — TRULANCE 3 MG PO TABS: 3.0000 mg | ORAL_TABLET | Freq: Every day | ORAL | 1 refills | Status: DC

## 2021-03-23 NOTE — Patient Instructions (Signed)
Looks like the insurance DOES cover Trulance but might need a prior authorization.  Let me work on that.  It might take a few days unless your secondary ins picks it up.

## 2021-03-23 NOTE — Progress Notes (Signed)
Subjective: CC: Hypertension, hyperlipidemia, chronic constipation PCP: [name], DO JJK:KXFGH T Andrew is a 76 y.o. female presenting to clinic today for:  1.  Hypertension with hyperlipidemia Patient is compliant with lisinopril, Pravachol.  No chest pain, shortness of breath, edema  2.  Chronic constipation Patient reports she has ongoing chronic constipation.  We tried ordering Linzess for her previously but her insurance would not cover.  She has had failures of MiraLAX, Metamucil, OTC stool softeners.  She eats plenty of fiber, stays physically active and drinks plenty of water.  She has suffered with chronic constipation for years.  Denies any GI bleeding.  No nausea or vomiting.  No decreased p.o. intake.   ROS: Per HPI  Allergies  Allergen Reactions   Codeine Nausea Only   Past Medical History:  Diagnosis Date   Abnormal glucose tolerance test 05/02/2011   Allergy    Anxiety    Bruit of right carotid artery 07/18/2018   Cancer ([place])    breast rt   Family history of breast cancer    Family history of leukemia    Family history of non-Hodgkin's lymphoma    Family history of pancreatic cancer    Family history of stomach cancer    GERD (gastroesophageal reflux disease)    High cholesterol    Hypertension    Liver fibrosis    Osteoporosis    Other malaise and fatigue 02/02/2010   Squamous cell carcinoma    Stress incontinence    Unspecified urinary incontinence 11/03/2004   Wears contact lenses     Current Outpatient Medications:    aspirin EC 81 MG tablet, Take 81 mg by mouth daily., Disp: , Rfl:    escitalopram (LEXAPRO) 10 MG tablet, Take 1 tablet (10 mg total) by mouth daily., Disp: 90 tablet, Rfl: 3   esomeprazole (NEXIUM) 20 MG capsule, TAKE 1 CAPSULE DAILY AT    NOON, Disp: 90 capsule, Rfl: 3   lisinopril (ZESTRIL) 10 MG tablet, Take 1 tablet (10 mg total) by mouth daily., Disp: 90 tablet, Rfl: 3   multivitamin-lutein (OCUVITE-LUTEIN) CAPS  capsule, Take 1 capsule by mouth daily. 250 mg capsule, Disp: , Rfl:    pravastatin (PRAVACHOL) 20 MG tablet, Take 1 tablet (20 mg total) by mouth daily., Disp: 90 tablet, Rfl: 3   traZODone (DESYREL) 50 MG tablet, Take 0.5-2 tablets (25-100 mg total) by mouth at bedtime as needed for sleep., Disp: 90 tablet, Rfl: 3   vitamin E 180 MG (400 UNITS) capsule, Take 400 Units by mouth daily., Disp: , Rfl:  Social History   Socioeconomic History   Marital status: Married    Spouse name: Not on file   Number of children: 2   Years of education: 13   Highest education level: Some college, no degree  Occupational History   Occupation: retired  Tobacco Use   Smoking status: Never   Smokeless tobacco: Never  Vaping Use   Vaping Use: Never used  Substance and Sexual Activity   Alcohol use: Yes    Alcohol/week: 5.0 standard drinks    Types: 5 Glasses of wine per week    Comment: occaionally   Drug use: No   Sexual activity: Yes  Other Topics Concern   Not on file  Social History Narrative   Not on file   Social Determinants of Health   Financial Resource Strain: Low Risk    Difficulty of Paying Living Expenses: Not hard at all  Food Insecurity: No  Food Insecurity   Worried About Charity fundraiser in the Last Year: Never true   Ran Out of Food in the Last Year: Never true  Transportation Needs: No Transportation Needs   Lack of Transportation (Medical): No   Lack of Transportation (Non-Medical): No  Physical Activity: Sufficiently Active   Days of Exercise per Week: 3 days   Minutes of Exercise per Session: 60 min  Stress: No Stress Concern Present   Feeling of Stress : Not at all  Social Connections: Socially Integrated   Frequency of Communication with Friends and Family: More than three times a week   Frequency of Social Gatherings with Friends and Family: More than three times a week   Attends Religious Services: More than 4 times per year   Active Member of [employer] or  Organizations: Yes   Attends Music therapist: More than 4 times per year   Marital Status: Married  Human resources officer Violence: Not At Risk   Fear of Current or Ex-Partner: No   Emotionally Abused: No   Physically Abused: No   Sexually Abused: No   Family History  Problem Relation Age of Onset   Breast cancer Mother 88   Heart failure Father    Breast cancer Paternal Aunt 15   Lymphoma Cousin 42       non- hodgkins lymphoma   Stomach cancer Maternal Uncle 18   Pancreatic cancer Other    Leukemia Other    Colon cancer Neg Hx    Rectal cancer Neg Hx    Esophageal cancer Neg Hx    Liver cancer Neg Hx     Objective: Office vital signs reviewed. BP 138/79    Pulse 89    Temp 97.9 F (36.6 C)    Ht 4' 11"  (1.499 m)    Wt 134 lb 12.8 oz (61.1 kg)    SpO2 97%    BMI 27.23 kg/m   Physical Examination:  General: Awake, alert, well nourished, No acute distress HEENT: Carotid bruit on the right present Cardio: regular rate and rhythm, S1S2 heard, no murmurs appreciated Pulm: clear to auscultation bilaterally, no wheezes, rhonchi or rales; normal work of breathing on room air GI: Soft, nontender, nondistended Neuro: Patellar DTRs 2/4 bilaterally  Assessment/ Plan: 76 y.o. female   Benign essential hypertension  Mixed hyperlipidemia  Stenosis of right carotid artery  Chronic constipation - Plan: Plecanatide (TRULANCE) 3 MG TABS  Need for shingles vaccine  Blood pressure well controlled.  No changes.  Plan for fasting lipid at next visit.  Bruits appreciated on exam.  Ultrasound was performed back in August.  We will continue to monitor  Trial of Trulance for chronic constipation.  She will contact me if this is helpful we can send to her mail order.  Shingles vaccination administered.  Orders Placed This Encounter  Procedures   Varicella-zoster vaccine IM (Shingrix)   No orders of the defined types were placed in this encounter.    [name], DO [place] [phone]

## 2021-03-24 ENCOUNTER — Telehealth: Payer: Self-pay | Admitting: *Deleted

## 2021-03-24 DIAGNOSIS — K5909 Other constipation: Secondary | ICD-10-CM

## 2021-03-24 NOTE — Telephone Encounter (Signed)
Trulance 3MG tablets PA started  Key: [id number] Shared Your PA request cannot be processed electronically. You will be receiving the question set by fax. For further inquiries please contact the number on the back of the member prescription card. (Message [phone])

## 2021-04-11 ENCOUNTER — Telehealth: Payer: Self-pay

## 2021-04-11 NOTE — Telephone Encounter (Signed)
Pa  in process

## 2021-05-02 ENCOUNTER — Encounter: Payer: Self-pay | Admitting: Family Medicine

## 2021-05-04 NOTE — Telephone Encounter (Signed)
Closing encounter - no response from plan - no issues from pt  ?

## 2021-05-20 ENCOUNTER — Telehealth: Payer: Self-pay

## 2021-05-20 NOTE — Telephone Encounter (Signed)
Penny Howard (Penny Howard) - [phone] ?Esomeprazole Magnesium 20MG dr capsules ?Status: PA Response - Approved ?Created: March 21st, 2023 ?Sent: March 31st, 2023 ?Open  Archive ?

## 2021-05-20 NOTE — Telephone Encounter (Signed)
Penny Howard - PA Case ID: [id number] Need help? Call us at [phone] ?Status ?Sent to [employer] ?Drug ?Esomeprazole Magnesium 20MG dr capsules ?Form ?[employer] Electronic PA Form [phone] NCPDP) ?

## 2021-07-30 ENCOUNTER — Other Ambulatory Visit: Payer: Self-pay | Admitting: Family Medicine

## 2021-07-30 DIAGNOSIS — F5101 Primary insomnia: Secondary | ICD-10-CM

## 2021-09-06 ENCOUNTER — Other Ambulatory Visit: Payer: Self-pay | Admitting: Family Medicine

## 2021-09-06 DIAGNOSIS — Z1231 Encounter for screening mammogram for malignant neoplasm of breast: Secondary | ICD-10-CM

## 2021-09-23 ENCOUNTER — Ambulatory Visit
Admission: RE | Admit: 2021-09-23 | Discharge: 2021-09-23 | Disposition: A | Discharge status: Home / Self Care | Payer: Medicare Other | Source: Ambulatory Visit | Attending: Family Medicine | Admitting: Family Medicine

## 2021-09-23 DIAGNOSIS — Z1231 Encounter for screening mammogram for malignant neoplasm of breast: Secondary | ICD-10-CM | POA: Diagnosis not present

## 2021-09-30 ENCOUNTER — Other Ambulatory Visit: Payer: Self-pay | Admitting: Family Medicine

## 2021-09-30 DIAGNOSIS — I10 Essential (primary) hypertension: Secondary | ICD-10-CM

## 2021-09-30 DIAGNOSIS — E782 Mixed hyperlipidemia: Secondary | ICD-10-CM

## 2021-09-30 DIAGNOSIS — K2100 Gastro-esophageal reflux disease with esophagitis, without bleeding: Secondary | ICD-10-CM

## 2021-09-30 DIAGNOSIS — F411 Generalized anxiety disorder: Secondary | ICD-10-CM

## 2021-10-31 ENCOUNTER — Ambulatory Visit: Payer: Medicare Other | Admitting: Family Medicine

## 2021-11-16 ENCOUNTER — Ambulatory Visit (INDEPENDENT_AMBULATORY_CARE_PROVIDER_SITE_OTHER): Payer: Medicare Other | Admitting: Family Medicine

## 2021-11-16 ENCOUNTER — Encounter: Payer: Self-pay | Admitting: Family Medicine

## 2021-11-16 VITALS — BP 111/69 | HR 65 | Temp 97.6°F | Ht 59.0 in | Wt 126.0 lb

## 2021-11-16 DIAGNOSIS — K2100 Gastro-esophageal reflux disease with esophagitis, without bleeding: Secondary | ICD-10-CM

## 2021-11-16 DIAGNOSIS — F411 Generalized anxiety disorder: Secondary | ICD-10-CM | POA: Diagnosis not present

## 2021-11-16 DIAGNOSIS — F5101 Primary insomnia: Secondary | ICD-10-CM | POA: Diagnosis not present

## 2021-11-16 DIAGNOSIS — E782 Mixed hyperlipidemia: Secondary | ICD-10-CM

## 2021-11-16 DIAGNOSIS — K5909 Other constipation: Secondary | ICD-10-CM | POA: Diagnosis not present

## 2021-11-16 DIAGNOSIS — I10 Essential (primary) hypertension: Secondary | ICD-10-CM | POA: Diagnosis not present

## 2021-11-16 DIAGNOSIS — Z23 Encounter for immunization: Secondary | ICD-10-CM | POA: Diagnosis not present

## 2021-11-16 DIAGNOSIS — I6521 Occlusion and stenosis of right carotid artery: Secondary | ICD-10-CM | POA: Diagnosis not present

## 2021-11-16 MED ORDER — PRAVASTATIN SODIUM 20 MG PO TABS: 20.0000 mg | ORAL_TABLET | Freq: Every day | ORAL | 3 refills | Status: DC

## 2021-11-16 MED ORDER — LISINOPRIL 10 MG PO TABS: 10.0000 mg | ORAL_TABLET | Freq: Every day | ORAL | 3 refills | Status: DC

## 2021-11-16 MED ORDER — ESOMEPRAZOLE MAGNESIUM 20 MG PO CPDR: 20.0000 mg | DELAYED_RELEASE_CAPSULE | Freq: Every day | ORAL | 3 refills | Status: DC | PRN

## 2021-11-16 MED ORDER — ESCITALOPRAM OXALATE 10 MG PO TABS: 10.0000 mg | ORAL_TABLET | Freq: Every day | ORAL | 3 refills | Status: DC

## 2021-11-16 MED ORDER — TRAZODONE HCL 50 MG PO TABS: ORAL_TABLET | ORAL | 3 refills | Status: DC

## 2021-11-16 NOTE — Progress Notes (Signed)
Subjective: CC:HTN, HDL PCP: [name], DO OEH:OZYYQ Penny Howard is a 76 y.o. female presenting to clinic today for:  1.  Hypertension hyperlipidemia Patient reports compliance with lisinopril, Pravachol.  No chest pain, shortness breath, edema or falls.  She been working out regularly and eating smaller portions with successful weight loss.  She reports that constipation has gotten better with colon cleanse OTC and that Trulance was never covered by insurance  2.  GERD Patient reports regular use of Nexium 20 mg daily.  No blood in stool.  No nausea, vomiting or abdominal pain.  She tried going without it in the past but unfortunately could not tolerate stopping the medicine totally.  Denies any memory issues.  Urine output is normal.   ROS: Per HPI  Allergies  Allergen Reactions   Codeine Nausea Only   Past Medical History:  Diagnosis Date   Abnormal glucose tolerance test 05/02/2011   Allergy    Anxiety    Bruit of right carotid artery 07/18/2018   Cancer ([place])    breast rt   Family history of breast cancer    Family history of leukemia    Family history of non-Hodgkin's lymphoma    Family history of pancreatic cancer    Family history of stomach cancer    GERD (gastroesophageal reflux disease)    High cholesterol    Hypertension    Liver fibrosis    Osteoporosis    Other malaise and fatigue 02/02/2010   Squamous cell carcinoma    Stress incontinence    Unspecified urinary incontinence 11/03/2004   Wears contact lenses     Current Outpatient Medications:    aspirin EC 81 MG tablet, Take 81 mg by mouth daily., Disp: , Rfl:    escitalopram (LEXAPRO) 10 MG tablet, TAKE 1 TABLET DAILY, Disp: 90 tablet, Rfl: 0   esomeprazole (NEXIUM) 20 MG capsule, TAKE 1 CAPSULE DAILY AT    NOON, Disp: 90 capsule, Rfl: 0   lisinopril (ZESTRIL) 10 MG tablet, Take 1 tablet by mouth once daily, Disp: 90 tablet, Rfl: 0   multivitamin-lutein (OCUVITE-LUTEIN) CAPS capsule, Take 1  capsule by mouth daily. 250 mg capsule, Disp: , Rfl:    Plecanatide (TRULANCE) 3 MG TABS, Take 3 mg by mouth daily., Disp: 30 tablet, Rfl: 1   pravastatin (PRAVACHOL) 20 MG tablet, TAKE 1 TABLET DAILY, Disp: 90 tablet, Rfl: 0   traZODone (DESYREL) 50 MG tablet, TAKE 1/2 TO 2 TABLETS BY MOUTH AT BEDTIME AS NEEDED FOR SLEEP, Disp: 90 tablet, Rfl: 3   vitamin E 180 MG (400 UNITS) capsule, Take 400 Units by mouth daily., Disp: , Rfl:  Social History   Socioeconomic History   Marital status: Married    Spouse name: Not on file   Number of children: 2   Years of education: 13   Highest education level: Some college, no degree  Occupational History   Occupation: retired  Tobacco Use   Smoking status: Never   Smokeless tobacco: Never  Vaping Use   Vaping Use: Never used  Substance and Sexual Activity   Alcohol use: Yes    Alcohol/week: 5.0 standard drinks of alcohol    Types: 5 Glasses of wine per week    Comment: occaionally   Drug use: No   Sexual activity: Yes  Other Topics Concern   Not on file  Social History Narrative   Not on file   Social Determinants of Health   Financial Resource Strain: Low Risk  (  01/10/2021)   Overall Financial Resource Strain (CARDIA)    Difficulty of Paying Living Expenses: Not hard at all  Food Insecurity: No Food Insecurity (01/10/2021)   Hunger Vital Sign    Worried About Running Out of Food in the Last Year: Never true    Ran Out of Food in the Last Year: Never true  Transportation Needs: No Transportation Needs (01/10/2021)   PRAPARE - Hydrologist (Medical): No    Lack of Transportation (Non-Medical): No  Physical Activity: Sufficiently Active (01/10/2021)   Exercise Vital Sign    Days of Exercise per Week: 3 days    Minutes of Exercise per Session: 60 min  Stress: No Stress Concern Present (01/10/2021)   [place]    Feeling of Stress : Not at  all  Social Connections: [place] (01/10/2021)   Social Connection and Isolation Panel [NHANES]    Frequency of Communication with Friends and Family: More than three times a week    Frequency of Social Gatherings with Friends and Family: More than three times a week    Attends Religious Services: More than 4 times per year    Active Member of [employer] or Organizations: Yes    Attends Music therapist: More than 4 times per year    Marital Status: Married  Human resources officer Violence: Not At Risk (01/10/2021)   Humiliation, Afraid, Rape, and Kick questionnaire    Fear of Current or Ex-Partner: No    Emotionally Abused: No    Physically Abused: No    Sexually Abused: No   Family History  Problem Relation Age of Onset   Breast cancer Mother 23   Heart failure Father    Breast cancer Paternal Aunt 72   Lymphoma Cousin 80       non- hodgkins lymphoma   Stomach cancer Maternal Uncle 18   Pancreatic cancer Other    Leukemia Other    Colon cancer Neg Hx    Rectal cancer Neg Hx    Esophageal cancer Neg Hx    Liver cancer Neg Hx     Objective: Office vital signs reviewed. BP 111/69   Pulse 65   Temp 97.6 F (36.4 C)   Ht 4' 11"  (1.499 m)   Wt 126 lb (57.2 kg)   SpO2 98%   BMI 25.45 kg/m   Physical Examination:  General: Awake, alert, well nourished, No acute distress HEENT: Normal    Neck: No masses palpated. No lymphadenopathy    Ears: Tympanic membranes intact, normal light reflex, no erythema, no bulging    Eyes: PERRLA, extraocular membranes intact, sclera white    Nose: nasal turbinates moist, clear nasal discharge    Throat: moist mucus membranes, no erythema, no tonsillar exudate.  Airway is patent Cardio: regular rate and rhythm, S1S2 heard, no murmurs appreciated Pulm: clear to auscultation bilaterally, no wheezes, rhonchi or rales; normal work of breathing on room air GI: soft, non-tender, non-distended, bowel sounds present x4, no  hepatomegaly, no splenomegaly, no masses Extremities: warm, well perfused, No edema, cyanosis or clubbing; +2 pulses bilaterally MSK: Independently with normal gait and station.  Slight stiffness with getting up from a lying position  Assessment/ Plan: 76 y.o. female   Benign essential hypertension - Plan: lisinopril (ZESTRIL) 10 MG tablet, CMP14+EGFR  Mixed hyperlipidemia - Plan: pravastatin (PRAVACHOL) 20 MG tablet, CMP14+EGFR, TSH, Lipid Panel  Stenosis of right carotid artery - Plan: CMP14+EGFR  Chronic constipation  Primary insomnia - Plan: traZODone (DESYREL) 50 MG tablet  Need for immunization against influenza - Plan: Flu Vaccine QUAD High Dose(Fluad)  Anxiety state - Plan: escitalopram (LEXAPRO) 10 MG tablet  Gastroesophageal reflux disease with esophagitis without hemorrhage - Plan: esomeprazole (NEXIUM) 20 MG capsule, CBC  Check fasting labs.  Blood pressure well controlled.  No changes.  Refills on medications have been sent.  Influenza vaccination administered.  Reviewed PPI association with dementia, renal disease and decreased vitamin absorption.  Advised her to try and use this as sparingly as possible.  She is going to try to q. other day dosing  No orders of the defined types were placed in this encounter.  No orders of the defined types were placed in this encounter.    [name], DO [place] [phone]

## 2021-11-17 LAB — CMP14+EGFR
ALT: 16 IU/L (ref 0–32)
AST: 18 IU/L (ref 0–40)
Albumin/Globulin Ratio: 2.3 — ABNORMAL HIGH (ref 1.2–2.2)
Albumin: 5.1 g/dL — ABNORMAL HIGH (ref 3.8–4.8)
Alkaline Phosphatase: 62 IU/L (ref 44–121)
BUN/Creatinine Ratio: 20 (ref 12–28)
BUN: 18 mg/dL (ref 8–27)
Bilirubin Total: 0.4 mg/dL (ref 0.0–1.2)
CO2: 19 mmol/L — ABNORMAL LOW (ref 20–29)
Calcium: 10.7 mg/dL — ABNORMAL HIGH (ref 8.7–10.3)
Chloride: 102 mmol/L (ref 96–106)
Creatinine, Ser: 0.9 mg/dL (ref 0.57–1.00)
Globulin, Total: 2.2 g/dL (ref 1.5–4.5)
Glucose: 96 mg/dL (ref 70–99)
Potassium: 5.2 mmol/L (ref 3.5–5.2)
Sodium: 139 mmol/L (ref 134–144)
Total Protein: 7.3 g/dL (ref 6.0–8.5)
eGFR: 67 mL/min/{1.73_m2} (ref >59)

## 2021-11-17 LAB — CBC
Hematocrit: 38.5 % (ref 34.0–46.6)
Hemoglobin: 12.8 g/dL (ref 11.1–15.9)
MCH: 31.2 pg (ref 26.6–33.0)
MCHC: 33.2 g/dL (ref 31.5–35.7)
MCV: 94 fL (ref 79–97)
Platelets: 307 10*3/uL (ref 150–450)
RBC: 4.1 x10E6/uL (ref 3.77–5.28)
RDW: 12 % (ref 11.7–15.4)
WBC: 7.3 10*3/uL (ref 3.4–10.8)

## 2021-11-17 LAB — LIPID PANEL
Chol/HDL Ratio: 3.3 ratio (ref 0.0–4.4)
Cholesterol, Total: 168 mg/dL (ref 100–199)
HDL: 51 mg/dL (ref >39)
LDL Chol Calc (NIH): 102 mg/dL — ABNORMAL HIGH (ref 0–99)
Triglycerides: 82 mg/dL (ref 0–149)
VLDL Cholesterol Cal: 15 mg/dL (ref 5–40)

## 2021-11-17 LAB — TSH: TSH: 1.75 u[IU]/mL (ref 0.450–4.500)

## 2022-01-09 DIAGNOSIS — L814 Other melanin hyperpigmentation: Secondary | ICD-10-CM | POA: Diagnosis not present

## 2022-01-09 DIAGNOSIS — L821 Other seborrheic keratosis: Secondary | ICD-10-CM | POA: Diagnosis not present

## 2022-01-09 DIAGNOSIS — L570 Actinic keratosis: Secondary | ICD-10-CM | POA: Diagnosis not present

## 2022-01-09 DIAGNOSIS — L820 Inflamed seborrheic keratosis: Secondary | ICD-10-CM | POA: Diagnosis not present

## 2022-01-09 DIAGNOSIS — Z85828 Personal history of other malignant neoplasm of skin: Secondary | ICD-10-CM | POA: Diagnosis not present

## 2022-01-09 DIAGNOSIS — Z1283 Encounter for screening for malignant neoplasm of skin: Secondary | ICD-10-CM | POA: Diagnosis not present

## 2022-01-09 DIAGNOSIS — I781 Nevus, non-neoplastic: Secondary | ICD-10-CM | POA: Diagnosis not present

## 2022-03-27 ENCOUNTER — Ambulatory Visit (INDEPENDENT_AMBULATORY_CARE_PROVIDER_SITE_OTHER): Payer: Medicare Other

## 2022-03-27 VITALS — Ht 59.0 in | Wt 122.0 lb

## 2022-03-27 DIAGNOSIS — Z0000 Encounter for general adult medical examination without abnormal findings: Secondary | ICD-10-CM

## 2022-03-27 NOTE — Progress Notes (Signed)
Subjective:   Penny Howard is a 77 y.o. female who presents for Medicare Annual (Subsequent) preventive examination. I connected with  [name] on 03/27/22 by a audio enabled telemedicine application and verified that I am speaking with the correct person using two identifiers.  Patient Location: Home  Provider Location: Home Office  I discussed the limitations of evaluation and management by telemedicine. The patient expressed understanding and agreed to proceed.  Review of Systems     Cardiac Risk Factors include: advanced age (>21mn, >>38women)     Objective:    Today's Vitals   03/27/22 1122  Weight: 122 lb (55.3 kg)  Height: '4\' 11"'$  (1.499 m)   Body mass index is 24.64 kg/m.     03/27/2022   11:25 AM 01/10/2021    9:17 AM 07/22/2018   10:28 AM 07/27/2015   10:13 AM 07/14/2014   11:20 AM 04/24/2014    6:15 AM 04/16/2014   10:31 AM  Advanced Directives  Does Patient Have a Medical Advance Directive? Yes No No No No  No  Type of [place]        Copy of HAlexandriain Chart? No - copy requested        Would patient like information on creating a medical advance directive?  No - Patient declined Yes (MAU/Ambulatory/Procedural Areas - Information given)   No - patient declined information Yes - Educational materials given    Current Medications (verified) Outpatient Encounter Medications as of 03/27/2022  Medication Sig   aspirin EC 81 MG tablet Take 81 mg by mouth daily.   escitalopram (LEXAPRO) 10 MG tablet Take 1 tablet (10 mg total) by mouth daily.   esomeprazole (NEXIUM) 20 MG capsule Take 1 capsule (20 mg total) by mouth daily as needed.   lisinopril (ZESTRIL) 10 MG tablet Take 1 tablet (10 mg total) by mouth daily.   multivitamin-lutein (OCUVITE-LUTEIN) CAPS capsule Take 1 capsule by mouth daily. 250 mg capsule   pravastatin (PRAVACHOL) 20 MG tablet Take 1 tablet (20 mg total) by mouth  daily.   traZODone (DESYREL) 50 MG tablet TAKE 1/2 TO 2 TABLETS BY MOUTH AT BEDTIME AS NEEDED FOR SLEEP   vitamin E 180 MG (400 UNITS) capsule Take 400 Units by mouth daily.   No facility-administered encounter medications on file as of 03/27/2022.    Allergies (verified) Codeine   History: Past Medical History:  Diagnosis Date   Abnormal glucose tolerance test 05/02/2011   Allergy    Anxiety    Bruit of right carotid artery 07/18/2018   Cancer ([place]    breast rt   Family history of breast cancer    Family history of leukemia    Family history of non-Hodgkin's lymphoma    Family history of pancreatic cancer    Family history of stomach cancer    GERD (gastroesophageal reflux disease)    High cholesterol    Hypertension    Liver fibrosis    Osteoporosis    Other malaise and fatigue 02/02/2010   Squamous cell carcinoma    Stress incontinence    Unspecified urinary incontinence 11/03/2004   Wears contact lenses    Past Surgical History:  Procedure Laterality Date   ABDOMINAL HYSTERECTOMY  1990   ovaries intact b/l   BLADDER SURGERY  10   mesh   COLONOSCOPY     LIPOSUCTION WITH LIPOFILLING Right 04/24/2014   Procedure: LIPOFILLING TO RIGHT CHEST/RIGHT  NIPPLE AREOLA COMPLEX CREATION WITH FULL THICKNESS SKIN GRAFT/REVISION OF RIGHT TRANSFLAP;  Surgeon: Irene Limbo, MD;  Location: [place];  Service: Plastics;  Laterality: Right;   MASTECTOMY Right 07/04/2013   MASTECTOMY W/ SENTINEL NODE BIOPSY  07/04/2013   RT TOTAL            DR Excell Seltzer   SCAR REVISION Right 04/24/2014   Procedure: SCAR REVISION;  Surgeon: Irene Limbo, MD;  Location: [place];  Service: Plastics;  Laterality: Right;   SIMPLE MASTECTOMY WITH AXILLARY SENTINEL NODE BIOPSY Right 07/04/2013   Procedure: RIGHT TOTAL MASTECTOMY WITH RIGHT  AXILLARY SENTINEL LYMPH NODE BIOPSY;  Surgeon: Edward Jolly, MD;  Location: [place];  Service: General;  Laterality: Right;   SKIN  FULL THICKNESS GRAFT Right 04/24/2014   Procedure: SKIN GRAFT FULL THICKNESS;  Surgeon: Irene Limbo, MD;  Location: [place];  Service: Plastics;  Laterality: Right;   SQUAMOUS CELL CARCINOMA EXCISION Right    Lower Leg   TRAM Right 12/30/2013   Procedure: TRANSVERSE RECTUS ABDOMINIS MYOCUTANEOUS (TRAM) FLAP FOR RIGHT BREAST RECONSTRUCTION;  Surgeon: Irene Limbo, MD;  Location: [place];  Service: Plastics;  Laterality: Right;   Family History  Problem Relation Age of Onset   Breast cancer Mother 62   Heart failure Father    Breast cancer Paternal Aunt 33   Lymphoma Cousin 48       non- hodgkins lymphoma   Stomach cancer Maternal Uncle 18   Pancreatic cancer Other    Leukemia Other    Colon cancer Neg Hx    Rectal cancer Neg Hx    Esophageal cancer Neg Hx    Liver cancer Neg Hx    Social History   Socioeconomic History   Marital status: Married    Spouse name: Not on file   Number of children: 2   Years of education: 13   Highest education level: Some college, no degree  Occupational History   Occupation: retired  Tobacco Use   Smoking status: Never   Smokeless tobacco: Never  Vaping Use   Vaping Use: Never used  Substance and Sexual Activity   Alcohol use: Yes    Alcohol/week: 5.0 standard drinks of alcohol    Types: 5 Glasses of wine per week    Comment: occaionally   Drug use: No   Sexual activity: Yes  Other Topics Concern   Not on file  Social History Narrative   Not on file   Social Determinants of Health   Financial Resource Strain: Low Risk  (03/27/2022)   Overall Financial Resource Strain (CARDIA)    Difficulty of Paying Living Expenses: Not hard at all  Food Insecurity: No Food Insecurity (03/27/2022)   Hunger Vital Sign    Worried About Running Out of Food in the Last Year: Never true    Ran Out of Food in the Last Year: Never true  Transportation Needs: No Transportation Needs (03/27/2022)   PRAPARE - Radiographer, therapeutic (Medical): No    Lack of Transportation (Non-Medical): No  Physical Activity: Sufficiently Active (03/27/2022)   Exercise Vital Sign    Days of Exercise per Week: 5 days    Minutes of Exercise per Session: 40 min  Stress: No Stress Concern Present (03/27/2022)   [place]    Feeling of Stress : Not at all  Social Connections: Moderately Integrated (03/27/2022)   Social  Connection and Isolation Panel [NHANES]    Frequency of Communication with Friends and Family: More than three times a week    Frequency of Social Gatherings with Friends and Family: More than three times a week    Attends Religious Services: 1 to 4 times per year    Active Member of [employer] or Organizations: No    Attends Music therapist: Never    Marital Status: Married    Tobacco Counseling Counseling given: Not Answered   Clinical Intake:  Pre-visit preparation completed: Yes  Pain : No/denies pain     Nutritional Risks: None Diabetes: No  How often do you need to have someone help you when you read instructions, pamphlets, or other written materials from your doctor or pharmacy?: 1 - Never  Diabetic?non  Interpreter Needed?: No  Information entered by :: Jadene Pierini, LPN   Activities of Daily Living    03/27/2022   11:25 AM  In your present state of health, do you have any difficulty performing the following activities:  Hearing? 0  Vision? 0  Difficulty concentrating or making decisions? 0  Walking or climbing stairs? 0  Dressing or bathing? 0  Doing errands, shopping? 0  Preparing Food and eating ? N  Using the Toilet? N  In the past six months, have you accidently leaked urine? N  Do you have problems with loss of bowel control? N  Managing your Medications? N  Managing your Finances? N  Housekeeping or managing your Housekeeping? N    Patient Care Team: [name], DO as PCP - General  (Family Medicine) Juanita Craver, MD as Consulting Physician (Gastroenterology) Michael Boston, MD as Consulting Physician (General Surgery) Mauri Pole, MD as Consulting Physician (Gastroenterology) Magrinat, Virgie Dad, MD (Inactive) as Consulting Physician (Oncology) Irene Limbo, MD as Consulting Physician (Plastic Surgery)  Indicate any recent Medical Services you may have received from other than [facility] providers in the past year (date may be approximate).     Assessment:   This is a routine wellness examination for [facility].  Hearing/Vision screen Vision Screening - Comments:: Wears rx glasses - up to date with routine eye exams with  Dr.Lee  Dietary issues and exercise activities discussed:     Goals Addressed             This Visit's Progress    DIET - INCREASE WATER INTAKE   On track      Depression Screen    03/27/2022   11:24 AM 11/16/2021    1:04 PM 03/23/2021   10:10 AM 01/10/2021    9:17 AM 09/20/2020    1:42 PM 08/05/2019   10:25 AM 04/30/2019   10:56 AM  PHQ 2/9 Scores  PHQ - 2 Score 0 0 0 0 0 0 0  PHQ- 9 Score   1   0 0    Fall Risk    03/27/2022   11:23 AM 11/16/2021    1:04 PM 03/23/2021   10:10 AM 01/10/2021    9:17 AM 09/20/2020    1:42 PM  Fall Risk   Falls in the past year? 0 0 0 0 0  Number falls in past yr: 0      Injury with Fall? 0      Risk for fall due to : No Fall Risks      Follow up Falls prevention discussed        FALL RISK PREVENTION PERTAINING TO THE HOME:  Any stairs in  or around the home? Yes  If so, are there any without handrails? No  Home free of loose throw rugs in walkways, pet beds, electrical cords, etc? Yes  Adequate lighting in your home to reduce risk of falls? Yes   ASSISTIVE DEVICES UTILIZED TO PREVENT FALLS:  Life alert? No  Use of a cane, walker or w/c? No  Grab bars in the bathroom? Yes  Shower chair or bench in shower? Yes  Elevated toilet seat or a handicapped toilet? Yes          03/27/2022    11:25 AM 01/10/2021    9:22 AM 07/22/2018   10:37 AM  6CIT Screen  What Year? 0 points 0 points 0 points  What month? 0 points 0 points 0 points  What time? 0 points 0 points 0 points  Count back from 20 0 points 0 points 0 points  Months in reverse 0 points 0 points 0 points  Repeat phrase 0 points 0 points 0 points  Total Score 0 points 0 points 0 points    Immunizations Immunization History  Administered Date(s) Administered   Fluad Quad(high Dose 65+) 01/09/2019, 01/19/2021, 11/16/2021   Influenza, High Dose Seasonal PF 02/08/2018   Influenza-Unspecified 12/14/2005   Moderna Sars-Covid-2 Vaccination 03/24/2019, 04/21/2019   Pneumococcal Conjugate-13 04/04/2017   Pneumococcal Polysaccharide-23 08/05/2019   Tdap 02/02/2010   Zoster Recombinat (Shingrix) 09/20/2020, 03/23/2021    TDAP status: Due, Education has been provided regarding the importance of this vaccine. Advised may receive this vaccine at local pharmacy or Health Dept. Aware to provide a copy of the vaccination record if obtained from local pharmacy or Health Dept. Verbalized acceptance and understanding.  Flu Vaccine status: Up to date  Pneumococcal vaccine status: Up to date  Covid-19 vaccine status: Completed vaccines  Qualifies for Shingles Vaccine? Yes   Zostavax completed Yes   Shingrix Completed?: Yes  Screening Tests Health Maintenance  Topic Date Due   COVID-19 Vaccine (3 - Moderna risk series) 05/19/2019   DTaP/Tdap/Td (2 - Td or Tdap) 02/03/2020   MAMMOGRAM  09/24/2022   Medicare Annual Wellness (AWV)  03/28/2023   COLONOSCOPY (Pts 45-45yr Insurance coverage will need to be confirmed)  10/26/2025   Pneumonia Vaccine 75 Years old  Completed   INFLUENZA VACCINE  Completed   DEXA SCAN  Completed   Hepatitis C Screening  Completed   Zoster Vaccines- Shingrix  Completed   HPV VACCINES  Aged Out    Health Maintenance  Health Maintenance Due  Topic Date Due   COVID-19 Vaccine (3 - Moderna  risk series) 05/19/2019   DTaP/Tdap/Td (2 - Td or Tdap) 02/03/2020    Colorectal cancer screening: Type of screening: Colonoscopy. Completed 10/27/2015. Repeat every 10 years  Mammogram status: Completed 09/23/2021. Repeat every year  Bone Density status: Completed 02/08/2018. Results reflect: Bone density results: OSTEOPENIA. Repeat every 5 years.  Lung Cancer Screening: (Low Dose CT Chest recommended if Age 75-80years, 30 pack-year currently smoking OR have quit w/in 15years.) does not qualify.   Lung Cancer Screening Referral: n/a  Additional Screening:  Hepatitis C Screening: does not qualify; Completed 11/22/2016  Vision Screening: Recommended annual ophthalmology exams for early detection of glaucoma and other disorders of the eye. Is the patient up to date with their annual eye exam?  Yes  Who is the provider or what is the name of the office in which the patient attends annual eye exams? Dr.Lee  If pt is not established with a provider,  would they like to be referred to a provider to establish care? No .   Dental Screening: Recommended annual dental exams for proper oral hygiene  Community Resource Referral / Chronic Care Management: CRR required this visit?  No   CCM required this visit?  No      Plan:     I have personally reviewed and noted the following in the patient's chart:   Medical and social history Use of alcohol, tobacco or illicit drugs  Current medications and supplements including opioid prescriptions. Patient is not currently taking opioid prescriptions. Functional ability and status Nutritional status Physical activity Advanced directives List of other physicians Hospitalizations, surgeries, and ER visits in previous 12 months Vitals Screenings to include cognitive, depression, and falls Referrals and appointments  In addition, I have reviewed and discussed with patient certain preventive protocols, quality metrics, and best practice  recommendations. A written personalized care plan for preventive services as well as general preventive health recommendations were provided to patient.     Daphane Shepherd, LPN   02/26/9676   Nurse Notes: Due Tdap Vaccine

## 2022-03-27 NOTE — Patient Instructions (Signed)
Penny Howard , Thank you for taking time to come for your Medicare Wellness Visit. I appreciate your ongoing commitment to your health goals. Please review the following plan we discussed and let me know if I can assist you in the future.   These are the goals we discussed:  Goals       DIET - INCREASE WATER INTAKE      Weight (lb) < 126 lb (57.2 kg) (pt-stated)      Increase activity and reduce portion size of high carbohydrate foods.         This is a list of the screening recommended for you and due dates:  Health Maintenance  Topic Date Due   COVID-19 Vaccine (3 - Moderna risk series) 05/19/2019   DTaP/Tdap/Td vaccine (2 - Td or Tdap) 02/03/2020   Mammogram  09/24/2022   Medicare Annual Wellness Visit  03/28/2023   Colon Cancer Screening  10/26/2025   Pneumonia Vaccine  Completed   Flu Shot  Completed   DEXA scan (bone density measurement)  Completed   Hepatitis C Screening: USPSTF Recommendation to screen - Ages 50-79 yo.  Completed   Zoster (Shingles) Vaccine  Completed   HPV Vaccine  Aged Out    Advanced directives: Please bring a copy of your health care power of attorney and living will to the office to be added to your chart at your convenience.   Conditions/risks identified: Aim for 30 minutes of exercise or brisk walking, 6-8 glasses of water, and 5 servings of fruits and vegetables each day.   Next appointment: Follow up in one year for your annual wellness visit    Preventive Care 65 Years and Older, Female Preventive care refers to lifestyle choices and visits with your health care provider that can promote health and wellness. What does preventive care include? A yearly physical exam. This is also called an annual well check. Dental exams once or twice a year. Routine eye exams. Ask your health care provider how often you should have your eyes checked. Personal lifestyle choices, including: Daily care of your teeth and gums. Regular physical  activity. Eating a healthy diet. Avoiding tobacco and drug use. Limiting alcohol use. Practicing safe sex. Taking low-dose aspirin every day. Taking vitamin and mineral supplements as recommended by your health care provider. What happens during an annual well check? The services and screenings done by your health care provider during your annual well check will depend on your age, overall health, lifestyle risk factors, and family history of disease. Counseling  Your health care provider may ask you questions about your: Alcohol use. Tobacco use. Drug use. Emotional well-being. Home and relationship well-being. Sexual activity. Eating habits. History of falls. Memory and ability to understand (cognition). Work and work Statistician. Reproductive health. Screening  You may have the following tests or measurements: Height, weight, and BMI. Blood pressure. Lipid and cholesterol levels. These may be checked every 5 years, or more frequently if you are over 34 years old. Skin check. Lung cancer screening. You may have this screening every year starting at age 45 if you have a 30-pack-year history of smoking and currently smoke or have quit within the past 15 years. Fecal occult blood test (FOBT) of the stool. You may have this test every year starting at age 64. Flexible sigmoidoscopy or colonoscopy. You may have a sigmoidoscopy every 5 years or a colonoscopy every 10 years starting at age 29. Hepatitis C blood test. Hepatitis B blood test. Sexually transmitted  disease (STD) testing. Diabetes screening. This is done by checking your blood sugar (glucose) after you have not eaten for a while (fasting). You may have this done every 1-3 years. Bone density scan. This is done to screen for osteoporosis. You may have this done starting at age 4. Mammogram. This may be done every 1-2 years. Talk to your health care provider about how often you should have regular mammograms. Talk with your  health care provider about your test results, treatment options, and if necessary, the need for more tests. Vaccines  Your health care provider may recommend certain vaccines, such as: Influenza vaccine. This is recommended every year. Tetanus, diphtheria, and acellular pertussis (Tdap, Td) vaccine. You may need a Td booster every 10 years. Zoster vaccine. You may need this after age 26. Pneumococcal 13-valent conjugate (PCV13) vaccine. One dose is recommended after age 48. Pneumococcal polysaccharide (PPSV23) vaccine. One dose is recommended after age 56. Talk to your health care provider about which screenings and vaccines you need and how often you need them. This information is not intended to replace advice given to you by your health care provider. Make sure you discuss any questions you have with your health care provider. Document Released: 03/05/2015 Document Revised: 10/27/2015 Document Reviewed: 12/08/2014 Elsevier Interactive Patient Education  2017 [employer] Prevention in the Home Falls can cause injuries. They can happen to people of all ages. There are many things you can do to make your home safe and to help prevent falls. What can I do on the outside of my home? Regularly fix the edges of walkways and driveways and fix any cracks. Remove anything that might make you trip as you walk through a door, such as a raised step or threshold. Trim any bushes or trees on the path to your home. Use bright outdoor lighting. Clear any walking paths of anything that might make someone trip, such as rocks or tools. Regularly check to see if handrails are loose or broken. Make sure that both sides of any steps have handrails. Any raised decks and porches should have guardrails on the edges. Have any leaves, snow, or ice cleared regularly. Use sand or salt on walking paths during winter. Clean up any spills in your garage right away. This includes oil or grease spills. What can I  do in the bathroom? Use night lights. Install grab bars by the toilet and in the tub and shower. Do not use towel bars as grab bars. Use non-skid mats or decals in the tub or shower. If you need to sit down in the shower, use a plastic, non-slip stool. Keep the floor dry. Clean up any water that spills on the floor as soon as it happens. Remove soap buildup in the tub or shower regularly. Attach bath mats securely with double-sided non-slip rug tape. Do not have throw rugs and other things on the floor that can make you trip. What can I do in the bedroom? Use night lights. Make sure that you have a light by your bed that is easy to reach. Do not use any sheets or blankets that are too big for your bed. They should not hang down onto the floor. Have a firm chair that has side arms. You can use this for support while you get dressed. Do not have throw rugs and other things on the floor that can make you trip. What can I do in the kitchen? Clean up any spills right away. Avoid walking  on wet floors. Keep items that you use a lot in easy-to-reach places. If you need to reach something above you, use a strong step stool that has a grab bar. Keep electrical cords out of the way. Do not use floor polish or wax that makes floors slippery. If you must use wax, use non-skid floor wax. Do not have throw rugs and other things on the floor that can make you trip. What can I do with my stairs? Do not leave any items on the stairs. Make sure that there are handrails on both sides of the stairs and use them. Fix handrails that are broken or loose. Make sure that handrails are as long as the stairways. Check any carpeting to make sure that it is firmly attached to the stairs. Fix any carpet that is loose or worn. Avoid having throw rugs at the top or bottom of the stairs. If you do have throw rugs, attach them to the floor with carpet tape. Make sure that you have a light switch at the top of the stairs  and the bottom of the stairs. If you do not have them, ask someone to add them for you. What else can I do to help prevent falls? Wear shoes that: Do not have high heels. Have rubber bottoms. Are comfortable and fit you well. Are closed at the toe. Do not wear sandals. If you use a stepladder: Make sure that it is fully opened. Do not climb a closed stepladder. Make sure that both sides of the stepladder are locked into place. Ask someone to hold it for you, if possible. Clearly mark and make sure that you can see: Any grab bars or handrails. First and last steps. Where the edge of each step is. Use tools that help you move around (mobility aids) if they are needed. These include: Canes. Walkers. Scooters. Crutches. Turn on the lights when you go into a dark area. Replace any light bulbs as soon as they burn out. Set up your furniture so you have a clear path. Avoid moving your furniture around. If any of your floors are uneven, fix them. If there are any pets around you, be aware of where they are. Review your medicines with your doctor. Some medicines can make you feel dizzy. This can increase your chance of falling. Ask your doctor what other things that you can do to help prevent falls. This information is not intended to replace advice given to you by your health care provider. Make sure you discuss any questions you have with your health care provider. Document Released: 12/03/2008 Document Revised: 07/15/2015 Document Reviewed: 03/13/2014 Elsevier Interactive Patient Education  2017 [employer].

## 2022-05-04 ENCOUNTER — Telehealth: Payer: Self-pay | Admitting: *Deleted

## 2022-05-04 NOTE — Telephone Encounter (Signed)
Attempted to complete PA on CoverMyMeds  Error message:  [facility] has indicated that it is too soon to refill this medication at the pharmacy for your patient.

## 2022-05-10 ENCOUNTER — Telehealth: Payer: Self-pay | Admitting: Family Medicine

## 2022-05-10 NOTE — Telephone Encounter (Signed)
Contacted Penny Howard to schedule their annual wellness visit. Appointment made for 03/29/2023.  Thank you,  [name],  [place] [facility] Direct Dial ??[phone]

## 2022-07-10 ENCOUNTER — Telehealth: Payer: Self-pay

## 2022-08-17 ENCOUNTER — Other Ambulatory Visit: Payer: Self-pay | Admitting: Family Medicine

## 2022-08-17 DIAGNOSIS — F5101 Primary insomnia: Secondary | ICD-10-CM

## 2022-08-18 ENCOUNTER — Ambulatory Visit
Admission: EM | Admit: 2022-08-18 | Discharge: 2022-08-18 | Disposition: A | Discharge status: Home / Self Care | Payer: Medicare Other | Attending: Family Medicine | Admitting: Family Medicine

## 2022-08-18 DIAGNOSIS — J3089 Other allergic rhinitis: Secondary | ICD-10-CM | POA: Diagnosis not present

## 2022-08-18 DIAGNOSIS — H6991 Unspecified Eustachian tube disorder, right ear: Secondary | ICD-10-CM

## 2022-08-18 MED ORDER — CETIRIZINE HCL 10 MG PO TABS: 10.0000 mg | ORAL_TABLET | Freq: Every day | ORAL | 2 refills | Status: DC

## 2022-08-18 MED ORDER — FLUTICASONE PROPIONATE 50 MCG/ACT NA SUSP: 1.0000 | Freq: Two times a day (BID) | NASAL | 2 refills | Status: DC

## 2022-08-18 MED ORDER — PREDNISONE 50 MG PO TABS: ORAL_TABLET | ORAL | 0 refills | Status: DC

## 2022-08-18 NOTE — ED Triage Notes (Signed)
Pt reports she has some right ear discomfort and itching x 2 days.

## 2022-08-18 NOTE — ED Provider Notes (Signed)
[facility]    CSN: [id number] Arrival date & time: 08/18/22  1207      History   Chief Complaint Chief Complaint  Patient presents with   Ear Pain    HPI Penny Howard is a 77 y.o. female.   Patient presenting today with 2-day history of right ear pressure, muffled hearing, itching.  Denies drainage, fever, chills, recent history of barotrauma.  Notes she has seasonal allergy symptoms every morning, not taking anything for this at this time.  So for now try anything for symptoms.    Past Medical History:  Diagnosis Date   Abnormal glucose tolerance test 05/02/2011   Allergy    Anxiety    Bruit of right carotid artery 07/18/2018   Cancer (HCC)    breast rt   Family history of breast cancer    Family history of leukemia    Family history of non-Hodgkin's lymphoma    Family history of pancreatic cancer    Family history of stomach cancer    GERD (gastroesophageal reflux disease)    High cholesterol    Hypertension    Liver fibrosis    Osteoporosis    Other malaise and fatigue 02/02/2010   Squamous cell carcinoma    Stress incontinence    Unspecified urinary incontinence 11/03/2004   Wears contact lenses     Patient Active Problem List   Diagnosis Date Noted   Genetic testing 08/20/2018   Family history of breast cancer    Family history of stomach cancer    Family history of pancreatic cancer    Family history of leukemia    Family history of non-Hodgkin's lymphoma    Carotid stenosis, asymptomatic, right 09/11/2016   Acquired absence of breast and absent nipple 12/30/2013   Malignant neoplasm of upper-outer quadrant of right breast in female, estrogen receptor positive (HCC) 06/05/2013   Mixed hyperlipidemia 08/26/2012   Osteoporosis 08/10/2011   Depressive disorder, not elsewhere classified 08/04/2010   Generalized osteoarthrosis, unspecified site 11/23/2004   Anxiety state 11/03/2004   Benign essential hypertension 11/03/2004    Gastro-esophageal reflux disease with esophagitis 11/03/2004    Past Surgical History:  Procedure Laterality Date   ABDOMINAL HYSTERECTOMY  1990   ovaries intact b/l   BLADDER SURGERY  10   mesh   COLONOSCOPY     LIPOSUCTION WITH LIPOFILLING Right 04/24/2014   Procedure: LIPOFILLING TO RIGHT CHEST/RIGHT NIPPLE AREOLA COMPLEX CREATION WITH FULL THICKNESS SKIN GRAFT/REVISION OF RIGHT TRANSFLAP;  Surgeon: Glenna Fellows, MD;  Location: [facility];  Service: Plastics;  Laterality: Right;   MASTECTOMY Right 07/04/2013   MASTECTOMY W/ SENTINEL NODE BIOPSY  07/04/2013   RT TOTAL            DR Johna Sheriff   SCAR REVISION Right 04/24/2014   Procedure: SCAR REVISION;  Surgeon: Glenna Fellows, MD;  Location: [facility];  Service: Plastics;  Laterality: Right;   SIMPLE MASTECTOMY WITH AXILLARY SENTINEL NODE BIOPSY Right 07/04/2013   Procedure: RIGHT TOTAL MASTECTOMY WITH RIGHT  AXILLARY SENTINEL LYMPH NODE BIOPSY;  Surgeon: Mariella Saa, MD;  Location: [facility];  Service: General;  Laterality: Right;   SKIN FULL THICKNESS GRAFT Right 04/24/2014   Procedure: SKIN GRAFT FULL THICKNESS;  Surgeon: Glenna Fellows, MD;  Location: [facility];  Service: Plastics;  Laterality: Right;   SQUAMOUS CELL CARCINOMA EXCISION Right    Lower Leg   TRAM Right 12/30/2013   Procedure: TRANSVERSE RECTUS ABDOMINIS MYOCUTANEOUS (TRAM)  FLAP FOR RIGHT BREAST RECONSTRUCTION;  Surgeon: Glenna Fellows, MD;  Location: [facility];  Service: Plastics;  Laterality: Right;    OB History   No obstetric history on file.      Home Medications    Prior to Admission medications   Medication Sig Start Date End Date Taking? Authorizing Provider  cetirizine (ZYRTEC ALLERGY) 10 MG tablet Take 1 tablet (10 mg total) by mouth daily. 08/18/22  Yes Particia Nearing, PA-C  fluticasone [facility]) 50 MCG/ACT nasal spray Place 1 spray into both nostrils 2 (two) times daily. 08/18/22  Yes Particia Nearing, PA-C  predniSONE (DELTASONE) 50 MG tablet Take 1 tab daily with breakfast for 3 days 08/18/22  Yes Particia Nearing, PA-C  aspirin EC 81 MG tablet Take 81 mg by mouth daily.    [provider]  escitalopram (LEXAPRO) 10 MG tablet Take 1 tablet (10 mg total) by mouth daily. 11/16/21   Raliegh Ip, DO  esomeprazole (NEXIUM) 20 MG capsule Take 1 capsule (20 mg total) by mouth daily as needed. 11/16/21   Raliegh Ip, DO  lisinopril (ZESTRIL) 10 MG tablet Take 1 tablet (10 mg total) by mouth daily. 11/16/21   Raliegh Ip, DO  multivitamin-lutein (OCUVITE-LUTEIN) CAPS capsule Take 1 capsule by mouth daily. 250 mg capsule 06/20/13   [provider]  pravastatin (PRAVACHOL) 20 MG tablet Take 1 tablet (20 mg total) by mouth daily. 11/16/21   Raliegh Ip, DO  traZODone (DESYREL) 50 MG tablet TAKE 1/2 TO 2 TABLETS AT   BEDTIME AS NEEDED FOR SLEEP 08/17/22   Milian, Aleen Campi, FNP  vitamin E 180 MG (400 UNITS) capsule Take 400 Units by mouth daily.    [provider]    Family History Family History  Problem Relation Age of Onset   Breast cancer Mother 61   Heart failure Father    Breast cancer Paternal Aunt 84   Lymphoma Cousin 35       non- hodgkins lymphoma   Stomach cancer Maternal Uncle 18   Pancreatic cancer Other    Leukemia Other    Colon cancer Neg Hx    Rectal cancer Neg Hx    Esophageal cancer Neg Hx    Liver cancer Neg Hx     Social History Social History   Tobacco Use   Smoking status: Never   Smokeless tobacco: Never  Vaping Use   Vaping Use: Never used  Substance Use Topics   Alcohol use: Yes    Alcohol/week: 5.0 standard drinks of alcohol    Types: 5 Glasses of wine per week    Comment: occaionally   Drug use: No     Allergies   Codeine   Review of Systems Review of Systems Per HPI  Physical Exam Triage Vital Signs ED Triage Vitals  Enc Vitals Group     BP 08/18/22 1332  139/81     Pulse Rate 08/18/22 1332 66     Resp 08/18/22 1332 16     Temp 08/18/22 1332 (!) 97.5 F (36.4 C)     Temp Source 08/18/22 1332 Oral     SpO2 08/18/22 1332 98 %     Weight --      Height --      Head Circumference --      Peak Flow --      Pain Score 08/18/22 1333 6     Pain Loc --      Pain  Edu? --      Excl. in GC? --    No data found.  Updated Vital Signs BP 139/81 (BP Location: Right Arm)   Pulse 66   Temp (!) 97.5 F (36.4 C) (Oral)   Resp 16   SpO2 98%   Visual Acuity Right Eye Distance:   Left Eye Distance:   Bilateral Distance:    Right Eye Near:   Left Eye Near:    Bilateral Near:     Physical Exam Vitals and nursing note reviewed.  Constitutional:      Appearance: Normal appearance. She is not ill-appearing.  HENT:     Head: Atraumatic.     Ears:     Comments: Right middle ear effusion, left TM benign    Nose: Rhinorrhea present.     Mouth/Throat:     Pharynx: Posterior oropharyngeal erythema present.  Eyes:     Extraocular Movements: Extraocular movements intact.     Conjunctiva/sclera: Conjunctivae normal.  Cardiovascular:     Rate and Rhythm: Normal rate and regular rhythm.     Heart sounds: Normal heart sounds.  Pulmonary:     Effort: Pulmonary effort is normal.     Breath sounds: Normal breath sounds.  Musculoskeletal:        General: Normal range of motion.     Cervical back: Normal range of motion and neck supple.  Skin:    General: Skin is warm and dry.  Neurological:     Mental Status: She is alert and oriented to person, place, and time.  Psychiatric:        Mood and Affect: Mood normal.        Thought Content: Thought content normal.        Judgment: Judgment normal.      UC Treatments / Results  Labs (all labs ordered are listed, but only abnormal results are displayed) Labs Reviewed - No data to display  EKG   Radiology No results found.  Procedures Procedures (including critical care  time)  Medications Ordered in UC Medications - No data to display  Initial Impression / Assessment and Plan / UC Course  I have reviewed the triage vital signs and the nursing notes.  Pertinent labs & imaging results that were available during my care of the patient were reviewed by me and considered in my medical decision making (see chart for details).     Consistent with seasonal allergy exacerbation causing eustachian tube dysfunction.  Treat with short burst of prednisone, Zyrtec, Flonase regimen ongoing, supportive home care and return precautions.    Final Clinical Impressions(s) / UC Diagnoses   Final diagnoses:  Seasonal allergic rhinitis due to other allergic trigger  Dysfunction of right eustachian tube   Discharge Instructions   None    ED Prescriptions     Medication Sig Dispense Auth. Provider   predniSONE (DELTASONE) 50 MG tablet Take 1 tab daily with breakfast for 3 days 3 tablet Particia Nearing, PA-C   cetirizine (ZYRTEC ALLERGY) 10 MG tablet Take 1 tablet (10 mg total) by mouth daily. 30 tablet Particia Nearing, PA-C   fluticasone [facility]) 50 MCG/ACT nasal spray Place 1 spray into both nostrils 2 (two) times daily. 16 g Particia Nearing, [place]      PDMP not reviewed this encounter.   Particia Nearing, [place] 08/18/22 [phone]

## 2022-09-21 ENCOUNTER — Other Ambulatory Visit: Payer: Self-pay | Admitting: Family Medicine

## 2022-09-21 DIAGNOSIS — Z1231 Encounter for screening mammogram for malignant neoplasm of breast: Secondary | ICD-10-CM

## 2022-10-03 ENCOUNTER — Ambulatory Visit: Payer: Medicare Other | Admitting: Family Medicine

## 2022-10-05 ENCOUNTER — Ambulatory Visit: Payer: Medicare Other

## 2022-10-13 ENCOUNTER — Ambulatory Visit: Payer: Medicare Other

## 2022-10-13 ENCOUNTER — Ambulatory Visit
Admission: RE | Admit: 2022-10-13 | Discharge: 2022-10-13 | Disposition: A | Discharge status: Home / Self Care | Payer: Medicare Other | Source: Ambulatory Visit | Attending: Family Medicine | Admitting: Family Medicine

## 2022-10-13 DIAGNOSIS — Z1231 Encounter for screening mammogram for malignant neoplasm of breast: Secondary | ICD-10-CM

## 2022-10-14 ENCOUNTER — Other Ambulatory Visit: Payer: Self-pay | Admitting: Family Medicine

## 2022-10-14 DIAGNOSIS — E782 Mixed hyperlipidemia: Secondary | ICD-10-CM

## 2022-10-14 DIAGNOSIS — I10 Essential (primary) hypertension: Secondary | ICD-10-CM

## 2022-10-14 DIAGNOSIS — K2100 Gastro-esophageal reflux disease with esophagitis, without bleeding: Secondary | ICD-10-CM

## 2022-10-14 DIAGNOSIS — F5101 Primary insomnia: Secondary | ICD-10-CM

## 2022-10-14 DIAGNOSIS — F411 Generalized anxiety disorder: Secondary | ICD-10-CM

## 2022-11-08 ENCOUNTER — Other Ambulatory Visit: Payer: Self-pay | Admitting: Family Medicine

## 2022-11-09 NOTE — Telephone Encounter (Signed)
Requested Prescriptions  Pending Prescriptions Disp Refills   cetirizine (ZYRTEC) 10 MG tablet [Pharmacy Med Name: Cetirizine HCl 10 MG Oral Tablet] 30 tablet 0    Sig: Take 1 tablet by mouth once daily     There is no refill protocol information for this order

## 2022-11-10 ENCOUNTER — Other Ambulatory Visit: Payer: Self-pay | Admitting: Family Medicine

## 2022-11-28 ENCOUNTER — Ambulatory Visit: Payer: Medicare Other | Admitting: Family Medicine

## 2022-11-28 ENCOUNTER — Encounter: Payer: Self-pay | Admitting: Family Medicine

## 2022-11-28 VITALS — BP 130/74 | HR 61 | Temp 98.3°F | Ht 59.0 in | Wt 122.4 lb

## 2022-11-28 DIAGNOSIS — M8588 Other specified disorders of bone density and structure, other site: Secondary | ICD-10-CM

## 2022-11-28 DIAGNOSIS — I6521 Occlusion and stenosis of right carotid artery: Secondary | ICD-10-CM

## 2022-11-28 DIAGNOSIS — F411 Generalized anxiety disorder: Secondary | ICD-10-CM | POA: Diagnosis not present

## 2022-11-28 DIAGNOSIS — Z170 Estrogen receptor positive status [ER+]: Secondary | ICD-10-CM

## 2022-11-28 DIAGNOSIS — I10 Essential (primary) hypertension: Secondary | ICD-10-CM

## 2022-11-28 DIAGNOSIS — F5101 Primary insomnia: Secondary | ICD-10-CM | POA: Diagnosis not present

## 2022-11-28 DIAGNOSIS — C50411 Malignant neoplasm of upper-outer quadrant of right female breast: Secondary | ICD-10-CM | POA: Diagnosis not present

## 2022-11-28 DIAGNOSIS — E782 Mixed hyperlipidemia: Secondary | ICD-10-CM

## 2022-11-28 DIAGNOSIS — K2100 Gastro-esophageal reflux disease with esophagitis, without bleeding: Secondary | ICD-10-CM | POA: Diagnosis not present

## 2022-11-28 DIAGNOSIS — Z23 Encounter for immunization: Secondary | ICD-10-CM | POA: Diagnosis not present

## 2022-11-28 MED ORDER — LISINOPRIL 10 MG PO TABS: 10.0000 mg | ORAL_TABLET | Freq: Every day | ORAL | 3 refills | Status: DC

## 2022-11-28 MED ORDER — PRAVASTATIN SODIUM 20 MG PO TABS: 20.0000 mg | ORAL_TABLET | Freq: Every day | ORAL | 3 refills | Status: DC

## 2022-11-28 MED ORDER — ESCITALOPRAM OXALATE 10 MG PO TABS: 10.0000 mg | ORAL_TABLET | Freq: Every day | ORAL | 3 refills | Status: DC

## 2022-11-28 MED ORDER — TRAZODONE HCL 50 MG PO TABS: ORAL_TABLET | ORAL | 3 refills | Status: DC

## 2022-11-28 MED ORDER — ESOMEPRAZOLE MAGNESIUM 20 MG PO CPDR: 20.0000 mg | DELAYED_RELEASE_CAPSULE | Freq: Every day | ORAL | 3 refills | Status: DC

## 2022-11-28 NOTE — Patient Instructions (Signed)
Due for tetanus  Preventive Care 65 Years and Older, Female Preventive care refers to lifestyle choices and visits with your health care provider that can promote health and wellness. Preventive care visits are also called wellness exams. What can I expect for my preventive care visit? Counseling Your health care provider may ask you questions about your: Medical history, including: Past medical problems. Family medical history. Pregnancy and menstrual history. History of falls. Current health, including: Memory and ability to understand (cognition). Emotional well-being. Home life and relationship well-being. Sexual activity and sexual health. Lifestyle, including: Alcohol, nicotine or tobacco, and drug use. Access to firearms. Diet, exercise, and sleep habits. Work and work Astronomer. Sunscreen use. Safety issues such as seatbelt and bike helmet use. Physical exam Your health care provider will check your: Height and weight. These may be used to calculate your BMI (body mass index). BMI is a measurement that tells if you are at a healthy weight. Waist circumference. This measures the distance around your waistline. This measurement also tells if you are at a healthy weight and may help predict your risk of certain diseases, such as type 2 diabetes and high blood pressure. Heart rate and blood pressure. Body temperature. Skin for abnormal spots. What immunizations do I need?  Vaccines are usually given at various ages, according to a schedule. Your health care provider will recommend vaccines for you based on your age, medical history, and lifestyle or other factors, such as travel or where you work. What tests do I need? Screening Your health care provider may recommend screening tests for certain conditions. This may include: Lipid and cholesterol levels. Hepatitis C test. Hepatitis B test. HIV (human immunodeficiency virus) test. STI (sexually transmitted infection)  testing, if you are at risk. Lung cancer screening. Colorectal cancer screening. Diabetes screening. This is done by checking your blood sugar (glucose) after you have not eaten for a while (fasting). Mammogram. Talk with your health care provider about how often you should have regular mammograms. BRCA-related cancer screening. This may be done if you have a family history of breast, ovarian, tubal, or peritoneal cancers. Bone density scan. This is done to screen for osteoporosis. Talk with your health care provider about your test results, treatment options, and if necessary, the need for more tests. Follow these instructions at home: Eating and drinking  Eat a diet that includes fresh fruits and vegetables, whole grains, lean protein, and low-fat dairy products. Limit your intake of foods with high amounts of sugar, saturated fats, and salt. Take vitamin and mineral supplements as recommended by your health care provider. Do not drink alcohol if your health care provider tells you not to drink. If you drink alcohol: Limit how much you have to 0-1 drink a day. Know how much alcohol is in your drink. In the [place] one drink equals one 12 oz bottle of beer (355 mL), one 5 oz glass of wine (148 mL), or one 1 oz glass of hard liquor (44 mL). Lifestyle Brush your teeth every morning and night with fluoride toothpaste. Floss one time each day. Exercise for at least 30 minutes 5 or more days each week. Do not use any products that contain nicotine or tobacco. These products include cigarettes, chewing tobacco, and vaping devices, such as e-cigarettes. If you need help quitting, ask your health care provider. Do not use drugs. If you are sexually active, practice safe sex. Use a condom or other form of protection in order to prevent STIs. Take aspirin  only as told by your health care provider. Make sure that you understand how much to take and what form to take. Work with your health care provider  to find out whether it is safe and beneficial for you to take aspirin daily. Ask your health care provider if you need to take a cholesterol-lowering medicine (statin). Find healthy ways to manage stress, such as: Meditation, yoga, or listening to music. Journaling. Talking to a trusted person. Spending time with friends and family. Minimize exposure to UV radiation to reduce your risk of skin cancer. Safety Always wear your seat belt while driving or riding in a vehicle. Do not drive: If you have been drinking alcohol. Do not ride with someone who has been drinking. When you are tired or distracted. While texting. If you have been using any mind-altering substances or drugs. Wear a helmet and other protective equipment during sports activities. If you have firearms in your house, make sure you follow all gun safety procedures. What's next? Visit your health care provider once a year for an annual wellness visit. Ask your health care provider how often you should have your eyes and teeth checked. Stay up to date on all vaccines. This information is not intended to replace advice given to you by your health care provider. Make sure you discuss any questions you have with your health care provider. Document Revised: 08/04/2020 Document Reviewed: 08/04/2020 Elsevier Patient Education  2024 [employer].

## 2022-11-28 NOTE — Progress Notes (Signed)
Penny Howard is a 77 y.o. female presents to office today for annual physical exam examination.    Concerns today include: 1.  Right sided carotid stenosis She reports no symptomology but would like to follow-up on the stenosis noted on previous imaging.  Last ultrasound was in 2022.  She continues to take Pravachol as directed, lisinopril.  She tries to stay physically active and maintain healthy diet   Occupation: retired, Marital status: married. Expecting her first great grandchild (will be a grandson), Substance use: none Health Maintenance Due  Topic Date Due   DTaP/Tdap/Td (2 - Td or Tdap) 02/03/2020   Refills needed today: all  Immunization History  Administered Date(s) Administered   Fluad Quad(high Dose 65+) 01/09/2019, 01/19/2021, 11/16/2021   Fluad Trivalent(High Dose 65+) 11/28/2022   Influenza, High Dose Seasonal PF 02/08/2018   Influenza-Unspecified 12/14/2005   Moderna Sars-Covid-2 Vaccination 03/24/2019, 04/21/2019   Pneumococcal Conjugate-13 04/04/2017   Pneumococcal Polysaccharide-23 08/05/2019   Tdap 02/02/2010   Zoster Recombinant(Shingrix) 09/20/2020, 03/23/2021   Past Medical History:  Diagnosis Date   Abnormal glucose tolerance test 05/02/2011   Allergy    Anxiety    Bruit of right carotid artery 07/18/2018   Cancer (HCC)    breast rt   Family history of breast cancer    Family history of leukemia    Family history of non-Hodgkin's lymphoma    Family history of pancreatic cancer    Family history of stomach cancer    GERD (gastroesophageal reflux disease)    High cholesterol    Hypertension    Liver fibrosis    Osteoporosis    Other malaise and fatigue 02/02/2010   Squamous cell carcinoma    Stress incontinence    Unspecified urinary incontinence 11/03/2004   Wears contact lenses    Social History   Socioeconomic History   Marital status: Married    Spouse name: Not on file   Number of children: 2   Years of education: 13    Highest education level: Some college, no degree  Occupational History   Occupation: retired  Tobacco Use   Smoking status: Never   Smokeless tobacco: Never  Vaping Use   Vaping status: Never Used  Substance and Sexual Activity   Alcohol use: Yes    Alcohol/week: 5.0 standard drinks of alcohol    Types: 5 Glasses of wine per week    Comment: occaionally   Drug use: No   Sexual activity: Yes  Other Topics Concern   Not on file  Social History Narrative   Not on file   Social Determinants of Health   Financial Resource Strain: Low Risk  (03/27/2022)   Overall Financial Resource Strain (CARDIA)    Difficulty of Paying Living Expenses: Not hard at all  Food Insecurity: No Food Insecurity (03/27/2022)   Hunger Vital Sign    Worried About Running Out of Food in the Last Year: Never true    Ran Out of Food in the Last Year: Never true  Transportation Needs: No Transportation Needs (03/27/2022)   PRAPARE - Administrator, Civil Service (Medical): No    Lack of Transportation (Non-Medical): No  Physical Activity: Sufficiently Active (03/27/2022)   Exercise Vital Sign    Days of Exercise per Week: 5 days    Minutes of Exercise per Session: 40 min  Stress: No Stress Concern Present (03/27/2022)   [employer] of Occupational Health - Occupational Stress Questionnaire    Feeling of Stress :  Not at all  Social Connections: Moderately Integrated (03/27/2022)   Social Connection and Isolation Panel [NHANES]    Frequency of Communication with Friends and Family: More than three times a week    Frequency of Social Gatherings with Friends and Family: More than three times a week    Attends Religious Services: 1 to 4 times per year    Active Member of [employer] or Organizations: No    Attends Banker Meetings: Never    Marital Status: Married  Catering manager Violence: Not At Risk (03/27/2022)   Humiliation, Afraid, Rape, and Kick questionnaire    Fear of Current or  Ex-Partner: No    Emotionally Abused: No    Physically Abused: No    Sexually Abused: No   Past Surgical History:  Procedure Laterality Date   ABDOMINAL HYSTERECTOMY  1990   ovaries intact b/l   BLADDER SURGERY  10   mesh   COLONOSCOPY     LIPOSUCTION WITH LIPOFILLING Right 04/24/2014   Procedure: LIPOFILLING TO RIGHT CHEST/RIGHT NIPPLE AREOLA COMPLEX CREATION WITH FULL THICKNESS SKIN GRAFT/REVISION OF RIGHT TRANSFLAP;  Surgeon: Glenna Fellows, MD;  Location: [facility];  Service: Plastics;  Laterality: Right;   MASTECTOMY Right 07/04/2013   MASTECTOMY W/ SENTINEL NODE BIOPSY  07/04/2013   RT TOTAL            DR Johna Sheriff   SCAR REVISION Right 04/24/2014   Procedure: SCAR REVISION;  Surgeon: Glenna Fellows, MD;  Location: [facility];  Service: Plastics;  Laterality: Right;   SIMPLE MASTECTOMY WITH AXILLARY SENTINEL NODE BIOPSY Right 07/04/2013   Procedure: RIGHT TOTAL MASTECTOMY WITH RIGHT  AXILLARY SENTINEL LYMPH NODE BIOPSY;  Surgeon: Mariella Saa, MD;  Location: [facility];  Service: General;  Laterality: Right;   SKIN FULL THICKNESS GRAFT Right 04/24/2014   Procedure: SKIN GRAFT FULL THICKNESS;  Surgeon: Glenna Fellows, MD;  Location: [facility];  Service: Plastics;  Laterality: Right;   SQUAMOUS CELL CARCINOMA EXCISION Right    Lower Leg   TRAM Right 12/30/2013   Procedure: TRANSVERSE RECTUS ABDOMINIS MYOCUTANEOUS (TRAM) FLAP FOR RIGHT BREAST RECONSTRUCTION;  Surgeon: Glenna Fellows, MD;  Location: [facility];  Service: Plastics;  Laterality: Right;   Family History  Problem Relation Age of Onset   Breast cancer Mother 46   Heart failure Father    Breast cancer Paternal Aunt 11   Lymphoma Cousin 35       non- hodgkins lymphoma   Stomach cancer Maternal Uncle 18   Pancreatic cancer Other    Leukemia Other    Colon cancer Neg Hx    Rectal cancer Neg Hx    Esophageal cancer Neg Hx    Liver cancer Neg Hx     Current Outpatient  Medications:    aspirin EC 81 MG tablet, Take 81 mg by mouth daily., Disp: , Rfl:    cetirizine (ZYRTEC) 10 MG tablet, Take 1 tablet by mouth once daily, Disp: 100 tablet, Rfl: 3   fluticasone (FLONASE) 50 MCG/ACT nasal spray, Place 1 spray into both nostrils 2 (two) times daily., Disp: 16 g, Rfl: 2   multivitamin-lutein (OCUVITE-LUTEIN) CAPS capsule, Take 1 capsule by mouth daily. 250 mg capsule, Disp: , Rfl:    vitamin E 180 MG (400 UNITS) capsule, Take 400 Units by mouth daily., Disp: , Rfl:    escitalopram (LEXAPRO) 10 MG tablet, Take 1 tablet (10 mg total) by mouth daily., Disp: 100 tablet, Rfl: 3  esomeprazole (NEXIUM) 20 MG capsule, Take 1 capsule (20 mg total) by mouth daily., Disp: 100 capsule, Rfl: 3   lisinopril (ZESTRIL) 10 MG tablet, Take 1 tablet (10 mg total) by mouth daily., Disp: 100 tablet, Rfl: 3   pravastatin (PRAVACHOL) 20 MG tablet, Take 1 tablet (20 mg total) by mouth daily., Disp: 100 tablet, Rfl: 3   traZODone (DESYREL) 50 MG tablet, TAKE 1/2 TO 2 TABLETS AT   BEDTIME AS NEEDED FOR SLEEP, Disp: 100 tablet, Rfl: 3  Allergies  Allergen Reactions   Codeine Nausea Only     ROS: Review of Systems Pertinent items noted in HPI and remainder of comprehensive ROS otherwise negative.    Physical exam BP 130/74   Pulse 61   Temp 98.3 F (36.8 C)   Ht 4\' 11"  (1.499 m)   Wt 122 lb 6.4 oz (55.5 kg)   SpO2 98%   BMI 24.72 kg/m  General appearance: alert, cooperative, appears stated age, and no distress Head: Normocephalic, without obvious abnormality, atraumatic Eyes: negative findings: lids and lashes normal, conjunctivae and sclerae normal, corneas clear, and pupils equal, round, reactive to light and accomodation Ears: normal TM's and external ear canals both ears Nose: Nares normal. Septum midline. Mucosa normal. No drainage or sinus tenderness. Throat: lips, mucosa, and tongue normal; teeth and gums normal Neck: no adenopathy, supple, symmetrical, trachea midline,  and thyroid not enlarged, symmetric, no tenderness/mass/nodules Back: symmetric, no curvature. ROM normal. No CVA tenderness. Lungs: clear to auscultation bilaterally Heart: regular rate and rhythm, S1, S2 normal, no murmur, click, rub or gallop Abdomen: soft, non-tender; bowel sounds normal; no masses,  no organomegaly Extremities: extremities normal, atraumatic, no cyanosis or edema Pulses: 2+ and symmetric Skin: Skin color, texture, turgor normal. No rashes or lesions Lymph nodes: Cervical, supraclavicular, and axillary nodes normal. Neurologic: Grossly normal      11/28/2022   11:17 AM 03/27/2022   11:24 AM 11/16/2021    1:04 PM  Depression screen PHQ 2/9  Decreased Interest 0 0 0  Down, Depressed, Hopeless 0 0 0  PHQ - 2 Score 0 0 0  Altered sleeping 0    Tired, decreased energy 0    Change in appetite 0    Feeling bad or failure about yourself  0    Trouble concentrating 0    Moving slowly or fidgety/restless 0    Suicidal thoughts 0    PHQ-9 Score 0    Difficult doing work/chores Not difficult at all        11/28/2022   11:17 AM 11/16/2021    1:04 PM 03/23/2021   10:10 AM 09/20/2020    1:42 PM  GAD 7 : Generalized Anxiety Score  Nervous, Anxious, on Edge 0 0 0 0  Control/stop worrying 0 0 0 0  Worry too much - different things 0 0 0 0  Trouble relaxing 0 0 0 0  Restless 0 0 0 0  Easily annoyed or irritable 0 0 0 0  Afraid - awful might happen 0 0 0 0  Total GAD 7 Score 0 0 0 0  Anxiety Difficulty Not difficult at all Not difficult at all Not difficult at all Not difficult at all     Assessment/ Plan: Penny Howard here for annual physical exam.   Benign essential hypertension - Plan: CMP14+EGFR, lisinopril (ZESTRIL) 10 MG tablet  Mixed hyperlipidemia - Plan: Lipid Panel, CMP14+EGFR, TSH, pravastatin (PRAVACHOL) 20 MG tablet  Stenosis of right carotid artery - Plan:  Lipid Panel, CMP14+EGFR, US Carotid Duplex Bilateral  Primary insomnia - Plan: traZODone  (DESYREL) 50 MG tablet  Malignant neoplasm of upper-outer quadrant of right breast in female, estrogen receptor positive (HCC) - Plan: CBC, VITAMIN D 25 Hydroxy (Vit-D Deficiency, Fractures)  Anxiety state - Plan: escitalopram (LEXAPRO) 10 MG tablet  Gastroesophageal reflux disease with esophagitis without hemorrhage - Plan: esomeprazole (NEXIUM) 20 MG capsule  Osteopenia of lumbar spine - Plan: VITAMIN D 25 Hydroxy (Vit-D Deficiency, Fractures)  Encounter for immunization - Plan: Flu Vaccine Trivalent High Dose (Fluad)  Bp well controlled. No changes. Refills sent  Fasting labs collected.  Check carotid u/s to follow up stenosis  Insomnia chronic and stable. Continue trazodone PRN  Continue to follow up with Hem/Onc for breast cancer surveillance/ management of BCA  Anxiety stable. She is very excited about her great grandson.  Gerd stable. Continue PPI prn.  Check vit d given h/o osteopenia  Flu shot administered.  Counseled on healthy lifestyle choices, including diet (rich in fruits, vegetables and lean meats and low in salt and simple carbohydrates) and exercise (at least 30 minutes of moderate physical activity daily).  Patient to follow up 1 year for CPE  Rorey Hodges M. Nadine Counts, DO

## 2022-11-29 LAB — CMP14+EGFR
ALT: 15 IU/L (ref 0–32)
AST: 26 IU/L (ref 0–40)
Albumin: 4.8 g/dL (ref 3.8–4.8)
Alkaline Phosphatase: 66 IU/L (ref 44–121)
BUN/Creatinine Ratio: 14 (ref 12–28)
BUN: 12 mg/dL (ref 8–27)
Bilirubin Total: 0.3 mg/dL (ref 0.0–1.2)
CO2: 20 mmol/L (ref 20–29)
Calcium: 10.5 mg/dL — ABNORMAL HIGH (ref 8.7–10.3)
Chloride: 102 mmol/L (ref 96–106)
Creatinine, Ser: 0.84 mg/dL (ref 0.57–1.00)
Globulin, Total: 2.4 g/dL (ref 1.5–4.5)
Glucose: 95 mg/dL (ref 70–99)
Potassium: 5.1 mmol/L (ref 3.5–5.2)
Sodium: 139 mmol/L (ref 134–144)
Total Protein: 7.2 g/dL (ref 6.0–8.5)
eGFR: 72 mL/min/{1.73_m2} (ref >59)

## 2022-11-29 LAB — CBC
Hematocrit: 39.1 % (ref 34.0–46.6)
Hemoglobin: 12.9 g/dL (ref 11.1–15.9)
MCH: 31.5 pg (ref 26.6–33.0)
MCHC: 33 g/dL (ref 31.5–35.7)
MCV: 96 fL (ref 79–97)
Platelets: 329 10*3/uL (ref 150–450)
RBC: 4.09 x10E6/uL (ref 3.77–5.28)
RDW: 11.8 % (ref 11.7–15.4)
WBC: 6.2 10*3/uL (ref 3.4–10.8)

## 2022-11-29 LAB — LIPID PANEL
Cholesterol, Total: 167 mg/dL (ref 100–199)
HDL: 52 mg/dL (ref >39)
LDL CALC COMMENT:: 3.2 ratio (ref 0.0–4.4)
LDL Chol Calc (NIH): 102 mg/dL — ABNORMAL HIGH (ref 0–99)
Triglycerides: 70 mg/dL (ref 0–149)
VLDL Cholesterol Cal: 13 mg/dL (ref 5–40)

## 2022-11-29 LAB — TSH: TSH: 2.9 u[IU]/mL (ref 0.450–4.500)

## 2022-11-29 LAB — VITAMIN D 25 HYDROXY (VIT D DEFICIENCY, FRACTURES): Vit D, 25-Hydroxy: 75.5 ng/mL (ref 30.0–100.0)

## 2022-11-30 ENCOUNTER — Other Ambulatory Visit: Payer: Self-pay | Admitting: Family Medicine

## 2022-11-30 DIAGNOSIS — I6521 Occlusion and stenosis of right carotid artery: Secondary | ICD-10-CM

## 2022-11-30 DIAGNOSIS — E782 Mixed hyperlipidemia: Secondary | ICD-10-CM

## 2022-11-30 MED ORDER — ROSUVASTATIN CALCIUM 10 MG PO TABS
10.0000 mg | ORAL_TABLET | Freq: Every day | ORAL | 3 refills | Status: DC
Start: 2022-11-30 — End: 2023-10-16

## 2022-12-07 ENCOUNTER — Encounter: Payer: Self-pay | Admitting: Family Medicine

## 2022-12-21 ENCOUNTER — Ambulatory Visit (HOSPITAL_COMMUNITY): Payer: Medicare Other

## 2023-01-02 ENCOUNTER — Ambulatory Visit (HOSPITAL_COMMUNITY)
Admission: RE | Admit: 2023-01-02 | Discharge: 2023-01-02 | Disposition: A | Discharge status: Home / Self Care | Payer: Medicare Other | Source: Ambulatory Visit | Attending: Family Medicine | Admitting: Family Medicine

## 2023-01-02 DIAGNOSIS — I6521 Occlusion and stenosis of right carotid artery: Secondary | ICD-10-CM | POA: Insufficient documentation

## 2023-01-03 ENCOUNTER — Encounter: Payer: Self-pay | Admitting: Family Medicine

## 2023-01-03 DIAGNOSIS — I6521 Occlusion and stenosis of right carotid artery: Secondary | ICD-10-CM

## 2023-01-07 ENCOUNTER — Encounter: Payer: Self-pay | Admitting: Family Medicine

## 2023-01-08 ENCOUNTER — Other Ambulatory Visit: Payer: Self-pay | Admitting: Family Medicine

## 2023-01-08 ENCOUNTER — Other Ambulatory Visit: Payer: Self-pay

## 2023-01-08 ENCOUNTER — Telehealth: Payer: Self-pay | Admitting: Family Medicine

## 2023-01-08 DIAGNOSIS — I6521 Occlusion and stenosis of right carotid artery: Secondary | ICD-10-CM

## 2023-01-08 NOTE — Telephone Encounter (Signed)
Referral redirected to Vascular

## 2023-01-08 NOTE — Telephone Encounter (Signed)
Copied from CRM [id number]. Topic: Referral - Question >> Jan 05, 2023  3:32 PM [name] wrote: Reason for CRM: Penny Howard std received a referral that's supposed to go to a vascular surgeon and not neuro Direct callback # [phone] ext [phone]

## 2023-01-08 NOTE — Telephone Encounter (Signed)
Please review and Correct Referral.

## 2023-01-09 ENCOUNTER — Other Ambulatory Visit: Payer: Self-pay | Admitting: Family Medicine

## 2023-01-09 DIAGNOSIS — I6521 Occlusion and stenosis of right carotid artery: Secondary | ICD-10-CM

## 2023-01-09 NOTE — Telephone Encounter (Signed)
Copied from CRM [id number]. Topic: General - Call Back - No Documentation >> Jan 09, 2023 10:31 AM Prudencio Pair wrote: Reason for CRM: Patient returning call back. Checked encounters and no details left. Pt would like for someone to give her a call back. CB # [phone].

## 2023-01-09 NOTE — Telephone Encounter (Signed)
Left vm for cb - 2nd call no answer - this call is for labs if pt calls back please check results

## 2023-01-28 ENCOUNTER — Other Ambulatory Visit: Payer: Self-pay | Admitting: Family Medicine

## 2023-01-28 DIAGNOSIS — F411 Generalized anxiety disorder: Secondary | ICD-10-CM

## 2023-01-28 DIAGNOSIS — K2100 Gastro-esophageal reflux disease with esophagitis, without bleeding: Secondary | ICD-10-CM

## 2023-01-28 DIAGNOSIS — I10 Essential (primary) hypertension: Secondary | ICD-10-CM

## 2023-02-05 NOTE — Progress Notes (Signed)
[facility] [place]  ASSESSMENT / PLAN: Penny Howard is a 77 y.o. female with asymptomatic right 70-99% carotid artery stenosis.   Recommend:  Abstinence from all tobacco products. Blood glucose control with goal A1c < 7%. Blood pressure control with goal blood pressure < 140/90 mmHg. Lipid reduction therapy with goal LDL-C <100 mg/dL  Aspirin 81mg  PO QD.  Atorvastatin 40-80mg  PO QD (or other "high intensity" statin therapy).  Check CT angiogram of head / neck to evaluate surgical anatomy.  CHIEF COMPLAINT: carotid stenosis  HISTORY OF PRESENT ILLNESS: Penny Howard is a 77 y.o. female referred to clinic for carotid artery stenosis.  This was discovered during workup for pulsatile tinnitus.  Clinical exam revealed a carotid bruit.  Carotid duplex was ordered which showed significant right carotid artery stenosis.  The patient reports no focal neurologic symptoms.  She has not had a stroke or mini stroke at any point in her life.  Past Medical History:  Diagnosis Date   Abnormal glucose tolerance test 05/02/2011   Allergy    Anxiety    Bruit of right carotid artery 07/18/2018   Cancer (HCC)    breast rt   Family history of breast cancer    Family history of leukemia    Family history of non-Hodgkin's lymphoma    Family history of pancreatic cancer    Family history of stomach cancer    GERD (gastroesophageal reflux disease)    High cholesterol    Hypertension    Liver fibrosis    Osteoporosis    Other malaise and fatigue 02/02/2010   Squamous cell carcinoma    Stress incontinence    Unspecified urinary incontinence 11/03/2004   Wears contact lenses     Past Surgical History:  Procedure Laterality Date   ABDOMINAL HYSTERECTOMY  1990   ovaries intact b/l   BLADDER SURGERY  10   mesh   COLONOSCOPY     LIPOSUCTION WITH LIPOFILLING Right 04/24/2014   Procedure: LIPOFILLING TO RIGHT CHEST/RIGHT NIPPLE AREOLA COMPLEX CREATION WITH FULL  THICKNESS SKIN GRAFT/REVISION OF RIGHT TRANSFLAP;  Surgeon: Glenna Fellows, MD;  Location: [facility];  Service: Plastics;  Laterality: Right;   MASTECTOMY Right 07/04/2013   MASTECTOMY W/ SENTINEL NODE BIOPSY  07/04/2013   RT TOTAL            DR Johna Sheriff   SCAR REVISION Right 04/24/2014   Procedure: SCAR REVISION;  Surgeon: Glenna Fellows, MD;  Location: [facility];  Service: Plastics;  Laterality: Right;   SIMPLE MASTECTOMY WITH AXILLARY SENTINEL NODE BIOPSY Right 07/04/2013   Procedure: RIGHT TOTAL MASTECTOMY WITH RIGHT  AXILLARY SENTINEL LYMPH NODE BIOPSY;  Surgeon: Mariella Saa, MD;  Location: [facility];  Service: General;  Laterality: Right;   SKIN FULL THICKNESS GRAFT Right 04/24/2014   Procedure: SKIN GRAFT FULL THICKNESS;  Surgeon: Glenna Fellows, MD;  Location: [facility];  Service: Plastics;  Laterality: Right;   SQUAMOUS CELL CARCINOMA EXCISION Right    Lower Leg   TRAM Right 12/30/2013   Procedure: TRANSVERSE RECTUS ABDOMINIS MYOCUTANEOUS (TRAM) FLAP FOR RIGHT BREAST RECONSTRUCTION;  Surgeon: Glenna Fellows, MD;  Location: [facility];  Service: Plastics;  Laterality: Right;    Family History  Problem Relation Age of Onset   Breast cancer Mother 65   Heart failure Father    Breast cancer Paternal Aunt 14   Lymphoma Cousin 35       non- hodgkins lymphoma  Stomach cancer Maternal Uncle 18   Pancreatic cancer Other    Leukemia Other    Colon cancer Neg Hx    Rectal cancer Neg Hx    Esophageal cancer Neg Hx    Liver cancer Neg Hx     Social History   Socioeconomic History   Marital status: Married    Spouse name: Not on file   Number of children: 2   Years of education: 13   Highest education level: Some college, no degree  Occupational History   Occupation: retired  Tobacco Use   Smoking status: Never   Smokeless tobacco: Never  Vaping Use   Vaping status: Never Used  Substance and Sexual Activity   Alcohol use:  Yes    Alcohol/week: 5.0 standard drinks of alcohol    Types: 5 Glasses of wine per week    Comment: occaionally   Drug use: No   Sexual activity: Yes  Other Topics Concern   Not on file  Social History Narrative   Not on file   Social Drivers of Health   Financial Resource Strain: Low Risk  (03/27/2022)   Overall Financial Resource Strain (CARDIA)    Difficulty of Paying Living Expenses: Not hard at all  Food Insecurity: No Food Insecurity (03/27/2022)   Hunger Vital Sign    Worried About Running Out of Food in the Last Year: Never true    Ran Out of Food in the Last Year: Never true  Transportation Needs: No Transportation Needs (03/27/2022)   PRAPARE - Administrator, Civil Service (Medical): No    Lack of Transportation (Non-Medical): No  Physical Activity: Sufficiently Active (03/27/2022)   Exercise Vital Sign    Days of Exercise per Week: 5 days    Minutes of Exercise per Session: 40 min  Stress: No Stress Concern Present (03/27/2022)   [employer] of Occupational Health - Occupational Stress Questionnaire    Feeling of Stress : Not at all  Social Connections: Moderately Integrated (03/27/2022)   Social Connection and Isolation Panel [NHANES]    Frequency of Communication with Friends and Family: More than three times a week    Frequency of Social Gatherings with Friends and Family: More than three times a week    Attends Religious Services: 1 to 4 times per year    Active Member of [employer] or Organizations: No    Attends Banker Meetings: Never    Marital Status: Married  Catering manager Violence: Not At Risk (03/27/2022)   Humiliation, Afraid, Rape, and Kick questionnaire    Fear of Current or Ex-Partner: No    Emotionally Abused: No    Physically Abused: No    Sexually Abused: No    Allergies  Allergen Reactions   Codeine Nausea Only    Current Outpatient Medications  Medication Sig Dispense Refill   aspirin EC 81 MG tablet Take 81 mg by  mouth daily.     cetirizine (ZYRTEC) 10 MG tablet Take 1 tablet by mouth once daily 100 tablet 3   escitalopram (LEXAPRO) 10 MG tablet Take 1 tablet (10 mg total) by mouth daily. 100 tablet 3   esomeprazole (NEXIUM) 20 MG capsule Take 1 capsule (20 mg total) by mouth daily. 100 capsule 3   fluticasone (FLONASE) 50 MCG/ACT nasal spray Place 1 spray into both nostrils 2 (two) times daily. 16 g 2   lisinopril (ZESTRIL) 10 MG tablet Take 1 tablet (10 mg total) by mouth daily. 100 tablet  3   multivitamin-lutein (OCUVITE-LUTEIN) CAPS capsule Take 1 capsule by mouth daily. 250 mg capsule     rosuvastatin (CRESTOR) 10 MG tablet Take 1 tablet (10 mg total) by mouth daily. Change in therapy 100 tablet 3   traZODone (DESYREL) 50 MG tablet TAKE 1/2 TO 2 TABLETS AT   BEDTIME AS NEEDED FOR SLEEP 100 tablet 3   vitamin E 180 MG (400 UNITS) capsule Take 400 Units by mouth daily.     No current facility-administered medications for this visit.    PHYSICAL EXAM Vitals:   02/06/23 1136 02/06/23 1140  BP: (!) 160/81 (!) 152/84  Pulse: 69   SpO2: 99%   Weight: 124 lb 6.4 oz (56.4 kg)   Height: 4\' 11"  (1.499 m)     Well-appearing elderly woman in no acute distress Regular rate and rhythm Unlabored breathing No focal neurologic signs No cranial nerve abnormalities  PERTINENT LABORATORY AND RADIOLOGIC DATA  Most recent CBC    Latest Ref Rng & Units 11/28/2022   12:01 PM 11/16/2021    1:38 PM 09/20/2020    2:09 PM  CBC  WBC 3.4 - 10.8 x10E3/uL 6.2  7.3  8.3   Hemoglobin 11.1 - 15.9 g/dL 28.4  13.2  44.0   Hematocrit 34.0 - 46.6 % 39.1  38.5  41.1   Platelets 150 - 450 x10E3/uL 329  307  327      Most recent CMP    Latest Ref Rng & Units 11/28/2022   12:01 PM 11/16/2021    1:38 PM 09/20/2020    2:09 PM  CMP  Glucose 70 - 99 mg/dL 95  96  102   BUN 8 - 27 mg/dL 12  18  17    Creatinine 0.57 - 1.00 mg/dL 7.25  3.66  4.40   Sodium 134 - 144 mmol/L 139  139  138   Potassium 3.5 - 5.2 mmol/L 5.1  5.2   4.8   Chloride 96 - 106 mmol/L 102  102  99   CO2 20 - 29 mmol/L 20  19  20    Calcium 8.7 - 10.3 mg/dL 34.7  42.5  95.6   Total Protein 6.0 - 8.5 g/dL 7.2  7.3  7.4   Total Bilirubin 0.0 - 1.2 mg/dL 0.3  0.4  0.3   Alkaline Phos 44 - 121 IU/L 66  62  68   AST 0 - 40 IU/L 26  18  25    ALT 0 - 32 IU/L 15  16  25      Renal function CrCl cannot be calculated (Patient's most recent lab result is older than the maximum 21 days allowed.).  No results found for: "HGBA1C"  LDL Chol Calc (NIH)  Date Value Ref Range Status  11/28/2022 102 (H) 0 - 99 mg/dL Final    Carotid Duplex  Right:   Heterogeneous plaque at the right carotid bifurcation contributes to 70%-99% stenosis by established duplex criteria.   Left:   Color duplex indicates moderate heterogeneous plaque, with no hemodynamically significant stenosis by duplex criteria in the extracranial cerebrovascular circulation.  Rande Brunt. Lenell Antu, MD FACS [facility] Phone Number: [phone] 02/06/2023 2:20 PM   Total time spent on preparing this encounter including chart review, data review, collecting history, examining the patient, coordinating care for this new patient, 60 minutes.  Portions of this report may have been transcribed using voice recognition software.  Every effort has been made to ensure accuracy; however,  inadvertent computerized transcription errors may still be present.

## 2023-02-06 ENCOUNTER — Encounter: Payer: Self-pay | Admitting: Vascular Surgery

## 2023-02-06 ENCOUNTER — Ambulatory Visit (INDEPENDENT_AMBULATORY_CARE_PROVIDER_SITE_OTHER): Payer: Medicare Other | Admitting: Vascular Surgery

## 2023-02-06 VITALS — BP 152/84 | HR 69 | Ht 59.0 in | Wt 124.4 lb

## 2023-02-06 DIAGNOSIS — I6523 Occlusion and stenosis of bilateral carotid arteries: Secondary | ICD-10-CM | POA: Diagnosis not present

## 2023-02-08 ENCOUNTER — Other Ambulatory Visit: Payer: Self-pay

## 2023-02-08 DIAGNOSIS — I6523 Occlusion and stenosis of bilateral carotid arteries: Secondary | ICD-10-CM

## 2023-02-19 ENCOUNTER — Encounter: Payer: Self-pay | Admitting: Vascular Surgery

## 2023-03-01 ENCOUNTER — Ambulatory Visit (HOSPITAL_COMMUNITY)
Admission: RE | Admit: 2023-03-01 | Discharge: 2023-03-01 | Disposition: A | Discharge status: Home / Self Care | Payer: Medicare Other | Source: Ambulatory Visit | Attending: Vascular Surgery | Admitting: Vascular Surgery

## 2023-03-01 ENCOUNTER — Other Ambulatory Visit: Payer: Self-pay | Admitting: Vascular Surgery

## 2023-03-01 DIAGNOSIS — I6523 Occlusion and stenosis of bilateral carotid arteries: Secondary | ICD-10-CM

## 2023-03-01 DIAGNOSIS — G9389 Other specified disorders of brain: Secondary | ICD-10-CM | POA: Diagnosis not present

## 2023-03-01 MED ORDER — SODIUM CHLORIDE (PF) 0.9 % IJ SOLN
INTRAMUSCULAR | Status: AC
  Filled 2023-03-01: qty 50

## 2023-03-01 MED ORDER — IOHEXOL 350 MG/ML SOLN
75.0000 mL | Freq: Once | INTRAVENOUS | Status: AC | PRN
  Administered 2023-03-01: 75 mL via INTRAVENOUS

## 2023-03-06 ENCOUNTER — Ambulatory Visit: Payer: Medicare Other | Admitting: Vascular Surgery

## 2023-03-29 ENCOUNTER — Ambulatory Visit: Payer: Medicare Other

## 2023-03-29 VITALS — Ht 59.0 in | Wt 124.0 lb

## 2023-03-29 DIAGNOSIS — Z0000 Encounter for general adult medical examination without abnormal findings: Secondary | ICD-10-CM

## 2023-03-29 NOTE — Progress Notes (Signed)
 Subjective:   Penny Howard is a 78 y.o. female who presents for Medicare Annual (Subsequent) preventive examination.  Visit Complete: Virtual I connected with  Penny Howard on 03/29/23 by a video and audio enabled telemedicine application and verified that I am speaking with the correct person using two identifiers.  Patient Location: Home  Provider Location: Home Office  This patient declined Interactive audio and video telecommunications. Therefore the visit was completed with audio only.  I discussed the limitations of evaluation and management by telemedicine. The patient expressed understanding and agreed to proceed.  Vital Signs: Because this visit was a virtual/telehealth visit, some criteria may be missing or patient reported. Any vitals not documented were not able to be obtained and vitals that have been documented are patient reported.  Cardiac Risk Factors include: advanced age (>25men, >50 women);hypertension     Objective:    Today's Vitals   03/29/23 1655  Weight: 124 lb (56.2 kg)  Height: 4' 11 (1.499 m)   Body mass index is 25.04 kg/m.     03/29/2023    4:59 PM 03/27/2022   11:25 AM 01/10/2021    9:17 AM 07/22/2018   10:28 AM 07/27/2015   10:13 AM 07/14/2014   11:20 AM 04/24/2014    6:15 AM  Advanced Directives  Does Patient Have a Medical Advance Directive? No Yes No No No No   Type of Special Educational Needs Teacher of [place];Living will       Copy of Healthcare Power of Attorney in Chart?  No - copy requested       Would patient like information on creating a medical advance directive? Yes (MAU/Ambulatory/Procedural Areas - Information given)  No - Patient declined Yes (MAU/Ambulatory/Procedural Areas - Information given)   No - patient declined information    Current Medications (verified) Outpatient Encounter Medications as of 03/29/2023  Medication Sig   aspirin EC 81 MG tablet Take 81 mg by mouth daily.   cetirizine  (ZYRTEC ) 10 MG tablet  Take 1 tablet by mouth once daily   escitalopram  (LEXAPRO ) 10 MG tablet Take 1 tablet (10 mg total) by mouth daily.   esomeprazole  (NEXIUM ) 20 MG capsule Take 1 capsule (20 mg total) by mouth daily.   fluticasone  (FLONASE ) 50 MCG/ACT nasal spray Place 1 spray into both nostrils 2 (two) times daily.   lisinopril  (ZESTRIL ) 10 MG tablet Take 1 tablet (10 mg total) by mouth daily.   multivitamin-lutein (OCUVITE-LUTEIN) CAPS capsule Take 1 capsule by mouth daily. 250 mg capsule   rosuvastatin  (CRESTOR ) 10 MG tablet Take 1 tablet (10 mg total) by mouth daily. Change in therapy   traZODone  (DESYREL ) 50 MG tablet TAKE 1/2 TO 2 TABLETS AT   BEDTIME AS NEEDED FOR SLEEP   vitamin E 180 MG (400 UNITS) capsule Take 400 Units by mouth daily.   No facility-administered encounter medications on file as of 03/29/2023.    Allergies (verified) Codeine   History: Past Medical History:  Diagnosis Date   Abnormal glucose tolerance test 05/02/2011   Allergy    Anxiety    Bruit of right carotid artery 07/18/2018   Cancer (HCC)    breast rt   Family history of breast cancer    Family history of leukemia    Family history of non-Hodgkin's lymphoma    Family history of pancreatic cancer    Family history of stomach cancer    GERD (gastroesophageal reflux disease)    High cholesterol    Hypertension  Liver fibrosis    Osteoporosis    Other malaise and fatigue 02/02/2010   Squamous cell carcinoma    Stress incontinence    Unspecified urinary incontinence 11/03/2004   Wears contact lenses    Past Surgical History:  Procedure Laterality Date   ABDOMINAL HYSTERECTOMY  1990   ovaries intact b/l   BLADDER SURGERY  10   mesh   COLONOSCOPY     LIPOSUCTION WITH LIPOFILLING Right 04/24/2014   Procedure: LIPOFILLING TO RIGHT CHEST/RIGHT NIPPLE AREOLA COMPLEX CREATION WITH FULL THICKNESS SKIN GRAFT/REVISION OF RIGHT TRANSFLAP;  Surgeon: Earlis Ranks, MD;  Location: [facility];  Service:  Plastics;  Laterality: Right;   MASTECTOMY Right 07/04/2013   MASTECTOMY W/ SENTINEL NODE BIOPSY  07/04/2013   RT TOTAL            DR MIKELL   SCAR REVISION Right 04/24/2014   Procedure: SCAR REVISION;  Surgeon: Earlis Ranks, MD;  Location: [facility];  Service: Plastics;  Laterality: Right;   SIMPLE MASTECTOMY WITH AXILLARY SENTINEL NODE BIOPSY Right 07/04/2013   Procedure: RIGHT TOTAL MASTECTOMY WITH RIGHT  AXILLARY SENTINEL LYMPH NODE BIOPSY;  Surgeon: Morene ONEIDA Mikell, MD;  Location: [facility];  Service: General;  Laterality: Right;   SKIN FULL THICKNESS GRAFT Right 04/24/2014   Procedure: SKIN GRAFT FULL THICKNESS;  Surgeon: Earlis Ranks, MD;  Location: [facility];  Service: Plastics;  Laterality: Right;   SQUAMOUS CELL CARCINOMA EXCISION Right    Lower Leg   TRAM Right 12/30/2013   Procedure: TRANSVERSE RECTUS ABDOMINIS MYOCUTANEOUS (TRAM) FLAP FOR RIGHT BREAST RECONSTRUCTION;  Surgeon: Earlis Ranks, MD;  Location: [facility];  Service: Plastics;  Laterality: Right;   Family History  Problem Relation Age of Onset   Breast cancer Mother 9   Heart failure Father    Breast cancer Paternal Aunt 59   Lymphoma Cousin 35       non- hodgkins lymphoma   Stomach cancer Maternal Uncle 18   Pancreatic cancer Other    Leukemia Other    Colon cancer Neg Hx    Rectal cancer Neg Hx    Esophageal cancer Neg Hx    Liver cancer Neg Hx    Social History   Socioeconomic History   Marital status: Married    Spouse name: Not on file   Number of children: 2   Years of education: 13   Highest education level: Some college, no degree  Occupational History   Occupation: retired  Tobacco Use   Smoking status: Never   Smokeless tobacco: Never  Vaping Use   Vaping status: Never Used  Substance and Sexual Activity   Alcohol use: Yes    Alcohol/week: 5.0 standard drinks of alcohol    Types: 5 Glasses of wine per week    Comment: occaionally   Drug use: No    Sexual activity: Yes  Other Topics Concern   Not on file  Social History Narrative   Not on file   Social Drivers of Health   Financial Resource Strain: Low Risk  (03/29/2023)   Overall Financial Resource Strain (CARDIA)    Difficulty of Paying Living Expenses: Not hard at all  Food Insecurity: No Food Insecurity (03/29/2023)   Hunger Vital Sign    Worried About Running Out of Food in the Last Year: Never true    Ran Out of Food in the Last Year: Never true  Transportation Needs: No Transportation Needs (03/29/2023)   PRAPARE -  Administrator, Civil Service (Medical): No    Lack of Transportation (Non-Medical): No  Physical Activity: Sufficiently Active (03/29/2023)   Exercise Vital Sign    Days of Exercise per Week: 5 days    Minutes of Exercise per Session: 30 min  Stress: No Stress Concern Present (03/29/2023)   [employer] of Occupational Health - Occupational Stress Questionnaire    Feeling of Stress : Not at all  Social Connections: Moderately Integrated (03/29/2023)   Social Connection and Isolation Panel [NHANES]    Frequency of Communication with Friends and Family: More than three times a week    Frequency of Social Gatherings with Friends and Family: Three times a week    Attends Religious Services: 1 to 4 times per year    Active Member of Clubs or Organizations: No    Attends Banker Meetings: Never    Marital Status: Married    Tobacco Counseling Counseling given: Not Answered   Clinical Intake:  Pre-visit preparation completed: Yes  Pain : No/denies pain     Diabetes: No  How often do you need to have someone help you when you read instructions, pamphlets, or other written materials from your doctor or pharmacy?: 1 - Never  Interpreter Needed?: No  Information entered by :: Charmaine Bloodgood LPN   Activities of Daily Living    03/29/2023    4:58 PM  In your present state of health, do you have any difficulty performing the  following activities:  Hearing? 0  Vision? 0  Difficulty concentrating or making decisions? 0  Walking or climbing stairs? 0  Dressing or bathing? 0  Doing errands, shopping? 0  Preparing Food and eating ? N  Using the Toilet? N  In the past six months, have you accidently leaked urine? N  Do you have problems with loss of bowel control? N  Managing your Medications? N  Managing your Finances? N  Housekeeping or managing your Housekeeping? N    Patient Care Team: Jolinda Norene HERO, DO as PCP - General (Family Medicine) Kristie Lamprey, MD as Consulting Physician (Gastroenterology) Sheldon Standing, MD as Consulting Physician (General Surgery) Shila Gustav GAILS, MD as Consulting Physician (Gastroenterology) Magrinat, Sandria BROCKS, MD (Inactive) as Consulting Physician (Oncology) Arelia Filippo, MD as Consulting Physician (Plastic Surgery) Ladora Ross Lacy Phebe, MD as Referring Physician (Optometry) Magda Debby SAILOR, MD as Consulting Physician (Vascular Surgery)  Indicate any recent Medical Services you may have received from other than [facility] providers in the past year (date may be approximate).     Assessment:   This is a routine wellness examination for [facility].  Hearing/Vision screen Hearing Screening - Comments:: Denies hearing difficulties   Vision Screening - Comments:: Wears rx glasses - up to date with routine eye exams with Dr. Ladora     Goals Addressed   None   Depression Screen    03/29/2023    4:57 PM 11/28/2022   11:17 AM 03/27/2022   11:24 AM 11/16/2021    1:04 PM 03/23/2021   10:10 AM 01/10/2021    9:17 AM 09/20/2020    1:42 PM  PHQ 2/9 Scores  PHQ - 2 Score 0 0 0 0 0 0 0  PHQ- 9 Score  0   1      Fall Risk    03/29/2023    4:58 PM 11/28/2022   11:17 AM 03/27/2022   11:23 AM 11/16/2021    1:04 PM 03/23/2021   10:10 AM  Fall Risk   Falls in the past year? 0 0 0 0 0  Number falls in past yr: 0 0 0    Injury with Fall? 0 0 0    Risk for fall due to : No Fall Risks No Fall  Risks No Fall Risks    Follow up Falls prevention discussed;Education provided;Falls evaluation completed Education provided Falls prevention discussed      MEDICARE RISK AT HOME: Medicare Risk at Home Any stairs in or around the home?: Yes If so, are there any without handrails?: No Home free of loose throw rugs in walkways, pet beds, electrical cords, etc?: Yes Adequate lighting in your home to reduce risk of falls?: Yes Life alert?: No Use of a cane, walker or w/c?: No Grab bars in the bathroom?: Yes Shower chair or bench in shower?: No Elevated toilet seat or a handicapped toilet?: Yes  TIMED UP AND GO:  Was the test performed?  No    Cognitive Function:        03/29/2023    4:59 PM 03/27/2022   11:25 AM 01/10/2021    9:22 AM 07/22/2018   10:37 AM  6CIT Screen  What Year? 0 points 0 points 0 points 0 points  What month? 0 points 0 points 0 points 0 points  What time? 0 points 0 points 0 points 0 points  Count back from 20 0 points 0 points 0 points 0 points  Months in reverse 0 points 0 points 0 points 0 points  Repeat phrase 0 points 0 points 0 points 0 points  Total Score 0 points 0 points 0 points 0 points    Immunizations Immunization History  Administered Date(s) Administered   Fluad Quad(high Dose 65+) 01/09/2019, 01/19/2021, 11/16/2021   Fluad Trivalent(High Dose 65+) 11/28/2022   Influenza, High Dose Seasonal PF 02/08/2018   Influenza-Unspecified 12/14/2005   Moderna Sars-Covid-2 Vaccination 03/24/2019, 04/21/2019   Pneumococcal Conjugate-13 04/04/2017   Pneumococcal Polysaccharide-23 08/05/2019   Tdap 02/02/2010   Zoster Recombinant(Shingrix) 09/20/2020, 03/23/2021    TDAP status: Due, Education has been provided regarding the importance of this vaccine. Advised may receive this vaccine at local pharmacy or Health Dept. Aware to provide a copy of the vaccination record if obtained from local pharmacy or Health Dept. Verbalized acceptance and  understanding.  Flu Vaccine status: Up to date  Pneumococcal vaccine status: Up to date  Covid-19 vaccine status: Information provided on how to obtain vaccines.   Qualifies for Shingles Vaccine? Yes   Zostavax completed No   Shingrix Completed?: Yes  Screening Tests Health Maintenance  Topic Date Due   COVID-19 Vaccine (3 - Moderna risk series) 05/19/2019   DTaP/Tdap/Td (2 - Td or Tdap) 02/03/2020   MAMMOGRAM  10/13/2023   Medicare Annual Wellness (AWV)  03/28/2024   Colonoscopy  10/26/2025   Pneumonia Vaccine 25+ Years old  Completed   INFLUENZA VACCINE  Completed   DEXA SCAN  Completed   Hepatitis C Screening  Completed   Zoster Vaccines- Shingrix  Completed   HPV VACCINES  Aged Out    Health Maintenance  Health Maintenance Due  Topic Date Due   COVID-19 Vaccine (3 - Moderna risk series) 05/19/2019   DTaP/Tdap/Td (2 - Td or Tdap) 02/03/2020    Colorectal cancer screening: Type of screening: Colonoscopy. Completed 10/27/15. Repeat every 10 years  Mammogram status: Completed 10/13/22. Repeat every year  Bone Density status: Completed 02/08/18. Results reflect: Bone density results: OSTEOPENIA. Repeat every 2 years.  Lung  Cancer Screening: (Low Dose CT Chest recommended if Age 52-80 years, 20 pack-year currently smoking OR have quit w/in 15years.) does not qualify.   Lung Cancer Screening Referral: n/a  Additional Screening:  Hepatitis C Screening: does qualify; Completed 11/22/16  Vision Screening: Recommended annual ophthalmology exams for early detection of glaucoma and other disorders of the eye. Is the patient up to date with their annual eye exam?  Yes  Who is the provider or what is the name of the office in which the patient attends annual eye exams? Dr. Ladora  If pt is not established with a provider, would they like to be referred to a provider to establish care? No .   Dental Screening: Recommended annual dental exams for proper oral hygiene  Community  Resource Referral / Chronic Care Management: CRR required this visit?  No   CCM required this visit?  No     Plan:     I have personally reviewed and noted the following in the patient's chart:   Medical and social history Use of alcohol, tobacco or illicit drugs  Current medications and supplements including opioid prescriptions. Patient is not currently taking opioid prescriptions. Functional ability and status Nutritional status Physical activity Advanced directives List of other physicians Hospitalizations, surgeries, and ER visits in previous 12 months Vitals Screenings to include cognitive, depression, and falls Referrals and appointments  In addition, I have reviewed and discussed with patient certain preventive protocols, quality metrics, and best practice recommendations. A written personalized care plan for preventive services as well as general preventive health recommendations were provided to patient.     Lavelle Pfeiffer [place], [place]   08/22/7972   After Visit Summary: (MyChart) Due to this being a telephonic visit, the after visit summary with patients personalized plan was offered to patient via MyChart   Nurse Notes: No concerns at this time

## 2023-03-29 NOTE — Patient Instructions (Signed)
 Penny Howard , Thank you for taking time to come for your Medicare Wellness Visit. I appreciate your ongoing commitment to your health goals. Please review the following plan we discussed and let me know if I can assist you in the future.   Referrals/Orders/Follow-Ups/Clinician Recommendations: Aim for 30 minutes of exercise or brisk walking, 6-8 glasses of water, and 5 servings of fruits and vegetables each day.  This is a list of the screening recommended for you and due dates:  Health Maintenance  Topic Date Due   COVID-19 Vaccine (3 - Moderna risk series) 05/19/2019   DTaP/Tdap/Td vaccine (2 - Td or Tdap) 02/03/2020   Mammogram  10/13/2023   Medicare Annual Wellness Visit  03/28/2024   Colon Cancer Screening  10/26/2025   Pneumonia Vaccine  Completed   Flu Shot  Completed   DEXA scan (bone density measurement)  Completed   Hepatitis C Screening  Completed   Zoster (Shingles) Vaccine  Completed   HPV Vaccine  Aged Out    Advanced directives: (ACP Link)Information on Advanced Care Planning can be found at [place]  Secretary of [facility] Advance Health Care Directives Advance Health Care Directives ([email or web address]   Next Medicare Annual Wellness Visit scheduled for next year: Yes

## 2023-04-09 NOTE — Progress Notes (Unsigned)
[facility] [place]  ASSESSMENT / PLAN: Penny Howard is a 78 y.o. female with asymptomatic right ~50% carotid artery stenosis. CT angiogram shows much less severe disease than expected by duplex.   Recommend:  Abstinence from all tobacco products. Blood glucose control with goal A1c < 7%. Blood pressure control with goal blood pressure < 140/90 mmHg. Lipid reduction therapy with goal LDL-C <100 mg/dL  Aspirin 81mg  PO QD.  Atorvastatin 40-80mg  PO QD (or other "high intensity" statin therapy).  Follow up in 1 year with duplex.  CHIEF COMPLAINT: carotid stenosis  HISTORY OF PRESENT ILLNESS: Penny Howard is a 78 y.o. female referred to clinic for carotid artery stenosis.  This was discovered during workup for pulsatile tinnitus.  Clinical exam revealed a carotid bruit.  Carotid duplex was ordered which showed significant right carotid artery stenosis.  The patient reports no focal neurologic symptoms.  She has not had a stroke or mini stroke at any point in her life.  04/10/23: Patient returns to clinic for CT angiogram review.  Thankfully she is doing very well.  No strokes or mini strokes.  We reviewed her CT scan in detail.  Past Medical History:  Diagnosis Date   Abnormal glucose tolerance test 05/02/2011   Allergy    Anxiety    Bruit of right carotid artery 07/18/2018   Cancer (HCC)    breast rt   Family history of breast cancer    Family history of leukemia    Family history of non-Hodgkin's lymphoma    Family history of pancreatic cancer    Family history of stomach cancer    GERD (gastroesophageal reflux disease)    High cholesterol    Hypertension    Liver fibrosis    Osteoporosis    Other malaise and fatigue 02/02/2010   Squamous cell carcinoma    Stress incontinence    Unspecified urinary incontinence 11/03/2004   Wears contact lenses     Past Surgical History:  Procedure Laterality Date   ABDOMINAL HYSTERECTOMY  1990   ovaries  intact b/l   BLADDER SURGERY  10   mesh   COLONOSCOPY     LIPOSUCTION WITH LIPOFILLING Right 04/24/2014   Procedure: LIPOFILLING TO RIGHT CHEST/RIGHT NIPPLE AREOLA COMPLEX CREATION WITH FULL THICKNESS SKIN GRAFT/REVISION OF RIGHT TRANSFLAP;  Surgeon: Glenna Fellows, MD;  Location: [facility];  Service: Plastics;  Laterality: Right;   MASTECTOMY Right 07/04/2013   MASTECTOMY W/ SENTINEL NODE BIOPSY  07/04/2013   RT TOTAL            DR Johna Sheriff   SCAR REVISION Right 04/24/2014   Procedure: SCAR REVISION;  Surgeon: Glenna Fellows, MD;  Location: [facility];  Service: Plastics;  Laterality: Right;   SIMPLE MASTECTOMY WITH AXILLARY SENTINEL NODE BIOPSY Right 07/04/2013   Procedure: RIGHT TOTAL MASTECTOMY WITH RIGHT  AXILLARY SENTINEL LYMPH NODE BIOPSY;  Surgeon: Mariella Saa, MD;  Location: [facility];  Service: General;  Laterality: Right;   SKIN FULL THICKNESS GRAFT Right 04/24/2014   Procedure: SKIN GRAFT FULL THICKNESS;  Surgeon: Glenna Fellows, MD;  Location: [facility];  Service: Plastics;  Laterality: Right;   SQUAMOUS CELL CARCINOMA EXCISION Right    Lower Leg   TRAM Right 12/30/2013   Procedure: TRANSVERSE RECTUS ABDOMINIS MYOCUTANEOUS (TRAM) FLAP FOR RIGHT BREAST RECONSTRUCTION;  Surgeon: Glenna Fellows, MD;  Location: [facility];  Service: Plastics;  Laterality: Right;    Family History  Problem Relation  Age of Onset   Breast cancer Mother 33   Heart failure Father    Breast cancer Paternal Aunt 44   Lymphoma Cousin 35       non- hodgkins lymphoma   Stomach cancer Maternal Uncle 18   Pancreatic cancer Other    Leukemia Other    Colon cancer Neg Hx    Rectal cancer Neg Hx    Esophageal cancer Neg Hx    Liver cancer Neg Hx     Social History   Socioeconomic History   Marital status: Married    Spouse name: Not on file   Number of children: 2   Years of education: 13   Highest education level: Some college, no degree   Occupational History   Occupation: retired  Tobacco Use   Smoking status: Never   Smokeless tobacco: Never  Vaping Use   Vaping status: Never Used  Substance and Sexual Activity   Alcohol use: Yes    Alcohol/week: 5.0 standard drinks of alcohol    Types: 5 Glasses of wine per week    Comment: occaionally   Drug use: No   Sexual activity: Yes  Other Topics Concern   Not on file  Social History Narrative   Not on file   Social Drivers of Health   Financial Resource Strain: Low Risk  (03/29/2023)   Overall Financial Resource Strain (CARDIA)    Difficulty of Paying Living Expenses: Not hard at all  Food Insecurity: No Food Insecurity (03/29/2023)   Hunger Vital Sign    Worried About Running Out of Food in the Last Year: Never true    Ran Out of Food in the Last Year: Never true  Transportation Needs: No Transportation Needs (03/29/2023)   PRAPARE - Administrator, Civil Service (Medical): No    Lack of Transportation (Non-Medical): No  Physical Activity: Sufficiently Active (03/29/2023)   Exercise Vital Sign    Days of Exercise per Week: 5 days    Minutes of Exercise per Session: 30 min  Stress: No Stress Concern Present (03/29/2023)   [employer] of Occupational Health - Occupational Stress Questionnaire    Feeling of Stress : Not at all  Social Connections: Moderately Integrated (03/29/2023)   Social Connection and Isolation Panel [NHANES]    Frequency of Communication with Friends and Family: More than three times a week    Frequency of Social Gatherings with Friends and Family: Three times a week    Attends Religious Services: 1 to 4 times per year    Active Member of Clubs or Organizations: No    Attends Banker Meetings: Never    Marital Status: Married  Catering manager Violence: Not At Risk (03/29/2023)   Humiliation, Afraid, Rape, and Kick questionnaire    Fear of Current or Ex-Partner: No    Emotionally Abused: No    Physically Abused: No     Sexually Abused: No    Allergies  Allergen Reactions   Codeine Nausea Only    Current Outpatient Medications  Medication Sig Dispense Refill   aspirin EC 81 MG tablet Take 81 mg by mouth daily.     cetirizine (ZYRTEC) 10 MG tablet Take 1 tablet by mouth once daily 100 tablet 3   escitalopram (LEXAPRO) 10 MG tablet Take 1 tablet (10 mg total) by mouth daily. 100 tablet 3   esomeprazole (NEXIUM) 20 MG capsule Take 1 capsule (20 mg total) by mouth daily. 100 capsule 3   fluticasone (  FLONASE) 50 MCG/ACT nasal spray Place 1 spray into both nostrils 2 (two) times daily. 16 g 2   lisinopril (ZESTRIL) 10 MG tablet Take 1 tablet (10 mg total) by mouth daily. 100 tablet 3   multivitamin-lutein (OCUVITE-LUTEIN) CAPS capsule Take 1 capsule by mouth daily. 250 mg capsule     rosuvastatin (CRESTOR) 10 MG tablet Take 1 tablet (10 mg total) by mouth daily. Change in therapy 100 tablet 3   traZODone (DESYREL) 50 MG tablet TAKE 1/2 TO 2 TABLETS AT   BEDTIME AS NEEDED FOR SLEEP 100 tablet 3   vitamin E 180 MG (400 UNITS) capsule Take 400 Units by mouth daily.     No current facility-administered medications for this visit.    PHYSICAL EXAM There were no vitals filed for this visit.   Well-appearing elderly woman in no acute distress Regular rate and rhythm Unlabored breathing No focal neurologic signs No cranial nerve abnormalities  PERTINENT LABORATORY AND RADIOLOGIC DATA  Most recent CBC    Latest Ref Rng & Units 11/28/2022   12:01 PM 11/16/2021    1:38 PM 09/20/2020    2:09 PM  CBC  WBC 3.4 - 10.8 x10E3/uL 6.2  7.3  8.3   Hemoglobin 11.1 - 15.9 g/dL 60.4  54.0  98.1   Hematocrit 34.0 - 46.6 % 39.1  38.5  41.1   Platelets 150 - 450 x10E3/uL 329  307  327      Most recent CMP    Latest Ref Rng & Units 11/28/2022   12:01 PM 11/16/2021    1:38 PM 09/20/2020    2:09 PM  CMP  Glucose 70 - 99 mg/dL 95  96  191   BUN 8 - 27 mg/dL 12  18  17    Creatinine 0.57 - 1.00 mg/dL 4.78  2.95  6.21    Sodium 134 - 144 mmol/L 139  139  138   Potassium 3.5 - 5.2 mmol/L 5.1  5.2  4.8   Chloride 96 - 106 mmol/L 102  102  99   CO2 20 - 29 mmol/L 20  19  20    Calcium 8.7 - 10.3 mg/dL 30.8  65.7  84.6   Total Protein 6.0 - 8.5 g/dL 7.2  7.3  7.4   Total Bilirubin 0.0 - 1.2 mg/dL 0.3  0.4  0.3   Alkaline Phos 44 - 121 IU/L 66  62  68   AST 0 - 40 IU/L 26  18  25    ALT 0 - 32 IU/L 15  16  25      Renal function CrCl cannot be calculated (Patient's most recent lab result is older than the maximum 21 days allowed.).  No results found for: "HGBA1C"  LDL Chol Calc (NIH)  Date Value Ref Range Status  11/28/2022 102 (H) 0 - 99 mg/dL Final    Carotid Duplex  Right:   Heterogeneous plaque at the right carotid bifurcation contributes to 70%-99% stenosis by established duplex criteria.   Left:   Color duplex indicates moderate heterogeneous plaque, with no hemodynamically significant stenosis by duplex criteria in the extracranial cerebrovascular circulation.  CT angiogram of head / neck:   Eccentric noncalcified plaque at the right ICA origin causing less than 50% stenosis. No stenosis at the left ICA origin.   Mild irregularity of the mid cervical ICAs may represent fibromuscular dysplasia.   No acute intracranial abnormality. Disproportionate ventricular prominence may reflect normal pressure hydrocephalus in the appropriate setting.   Rande Brunt.  Lenell Antu, MD FACS [facility] Phone Number: [phone] 04/09/2023 3:24 PM   Total time spent on preparing this encounter including chart review, data review, collecting history, examining the patient, coordinating care for this established patient, 20 minutes.  Portions of this report may have been transcribed using voice recognition software.  Every effort has been made to ensure accuracy; however, inadvertent computerized transcription errors may still be present.

## 2023-04-10 ENCOUNTER — Ambulatory Visit (INDEPENDENT_AMBULATORY_CARE_PROVIDER_SITE_OTHER): Payer: Medicare Other | Admitting: Vascular Surgery

## 2023-04-10 ENCOUNTER — Encounter: Payer: Self-pay | Admitting: Vascular Surgery

## 2023-04-10 VITALS — BP 129/77 | HR 72

## 2023-04-10 DIAGNOSIS — I6523 Occlusion and stenosis of bilateral carotid arteries: Secondary | ICD-10-CM | POA: Diagnosis not present

## 2023-07-03 DIAGNOSIS — R222 Localized swelling, mass and lump, trunk: Secondary | ICD-10-CM | POA: Diagnosis not present

## 2023-07-03 DIAGNOSIS — Z85828 Personal history of other malignant neoplasm of skin: Secondary | ICD-10-CM | POA: Diagnosis not present

## 2023-07-03 DIAGNOSIS — R2231 Localized swelling, mass and lump, right upper limb: Secondary | ICD-10-CM | POA: Diagnosis not present

## 2023-07-03 DIAGNOSIS — L821 Other seborrheic keratosis: Secondary | ICD-10-CM | POA: Diagnosis not present

## 2023-07-03 DIAGNOSIS — L570 Actinic keratosis: Secondary | ICD-10-CM | POA: Diagnosis not present

## 2023-07-03 DIAGNOSIS — L814 Other melanin hyperpigmentation: Secondary | ICD-10-CM | POA: Diagnosis not present

## 2023-07-03 DIAGNOSIS — I781 Nevus, non-neoplastic: Secondary | ICD-10-CM | POA: Diagnosis not present

## 2023-07-12 DIAGNOSIS — Z9011 Acquired absence of right breast and nipple: Secondary | ICD-10-CM | POA: Diagnosis not present

## 2023-07-12 DIAGNOSIS — Z9889 Other specified postprocedural states: Secondary | ICD-10-CM | POA: Diagnosis not present

## 2023-07-12 DIAGNOSIS — Z853 Personal history of malignant neoplasm of breast: Secondary | ICD-10-CM | POA: Diagnosis not present

## 2023-07-13 ENCOUNTER — Other Ambulatory Visit: Payer: Self-pay | Admitting: Plastic Surgery

## 2023-07-13 DIAGNOSIS — R2231 Localized swelling, mass and lump, right upper limb: Secondary | ICD-10-CM

## 2023-07-13 DIAGNOSIS — N6331 Unspecified lump in axillary tail of the right breast: Secondary | ICD-10-CM

## 2023-07-13 DIAGNOSIS — Z853 Personal history of malignant neoplasm of breast: Secondary | ICD-10-CM

## 2023-07-17 ENCOUNTER — Ambulatory Visit
Admission: RE | Admit: 2023-07-17 | Discharge: 2023-07-17 | Disposition: A | Discharge status: Home / Self Care | Source: Ambulatory Visit | Attending: Plastic Surgery

## 2023-07-17 ENCOUNTER — Ambulatory Visit
Admission: RE | Admit: 2023-07-17 | Discharge: 2023-07-17 | Disposition: A | Discharge status: Home / Self Care | Source: Ambulatory Visit | Attending: Plastic Surgery | Admitting: Plastic Surgery

## 2023-07-17 DIAGNOSIS — Z170 Estrogen receptor positive status [ER+]: Secondary | ICD-10-CM | POA: Diagnosis not present

## 2023-07-17 DIAGNOSIS — Z9011 Acquired absence of right breast and nipple: Secondary | ICD-10-CM | POA: Diagnosis not present

## 2023-07-17 DIAGNOSIS — R2231 Localized swelling, mass and lump, right upper limb: Secondary | ICD-10-CM

## 2023-07-17 DIAGNOSIS — Z853 Personal history of malignant neoplasm of breast: Secondary | ICD-10-CM

## 2023-07-17 DIAGNOSIS — N6331 Unspecified lump in axillary tail of the right breast: Secondary | ICD-10-CM

## 2023-07-17 DIAGNOSIS — C50911 Malignant neoplasm of unspecified site of right female breast: Secondary | ICD-10-CM | POA: Diagnosis not present

## 2023-07-17 HISTORY — PX: BREAST BIOPSY: SHX20

## 2023-07-18 LAB — SURGICAL PATHOLOGY

## 2023-07-18 NOTE — Telephone Encounter (Signed)
 Spoke with patient regarding biopsy results. Referral made to [facility].

## 2023-07-23 ENCOUNTER — Ambulatory Visit: Payer: Self-pay | Admitting: Surgery

## 2023-07-23 ENCOUNTER — Telehealth: Payer: Self-pay

## 2023-07-23 ENCOUNTER — Other Ambulatory Visit: Payer: Self-pay | Admitting: Surgery

## 2023-07-23 DIAGNOSIS — C50811 Malignant neoplasm of overlapping sites of right female breast: Secondary | ICD-10-CM | POA: Diagnosis not present

## 2023-07-23 DIAGNOSIS — C50911 Malignant neoplasm of unspecified site of right female breast: Secondary | ICD-10-CM

## 2023-07-23 NOTE — Progress Notes (Signed)
 REFERRING PHYSICIAN:  Arelia Filippo, MD PROVIDER:  DEBBY CURTISTINE SHIPPER, MD MRN: [id number] DOB: 02-Jul-1945 DATE OF ENCOUNTER: 07/23/2023 Subjective     Chief Complaint: New Consultation (Rt breast cancer)   History of Present Illness: Penny Howard is a 78 y.o. female who is seen today as an office consultation for evaluation of New Consultation (Rt breast cancer)   Pleasant 78 year old female status post right mastectomy in 2015 for invasive lobular carcinoma ER positive PR positive HER2/neu negative with a KI of 80%.  This was by Dr. Mikell and was followed by TRAM flap.  She noticed an area about 6 months ago in her right axilla on the same side which was thickened.  The area was quite heterogeneous and core biopsy showed invasive lobular carcinoma ER positive PR positive HER2/neu negative with a KI of 15%.  She was seen by her plastic surgeon and referred here.  She has no other complaints.    Review of Systems: A complete review of systems was obtained from the patient.  I have reviewed this information and discussed as appropriate with the patient.  See HPI as well for other ROS.     Medical History: Past Medical History:  Diagnosis Date  . GERD (gastroesophageal reflux disease)   . History of cancer   . Hyperlipidemia   . Hypertension     There is no problem list on file for this patient.   Past Surgical History:  Procedure Laterality Date  . MASTECTOMY       Allergies  Allergen Reactions  . Codeine Other (See Comments)    Unknown reaction    Current Outpatient Medications on File Prior to Visit  Medication Sig Dispense Refill  . aspirin 81 MG EC tablet Take 81 mg by mouth once daily    . cetirizine  (ZYRTEC ) 10 MG tablet Take 10 mg by mouth once daily    . escitalopram  oxalate (LEXAPRO ) 10 MG tablet Oral; Duration: 90    . esomeprazole  (NEXIUM ) 20 MG DR capsule     . lisinopriL  (ZESTRIL ) 10 MG tablet Oral; Duration: 90    .  mv-mn/om3/dha/epa/fish/lut/zea (OCUVITE ADULT 50 PLUS ORAL)     . rosuvastatin  (CRESTOR ) 10 MG tablet TAKE 1 TABLET DAILY. CHANGEIN THERAPY.    . traZODone  (DESYREL ) 50 MG tablet TAKE 1/2 TO 2 TABLETS AT   BEDTIME AS NEEDED FOR SLEEP    . vitamin E 400 UNIT capsule      No current facility-administered medications on file prior to visit.    Family History  Problem Relation Age of Onset  . Breast cancer Mother      Social History   Tobacco Use  Smoking Status Never  Smokeless Tobacco Never     Social History   Socioeconomic History  . Marital status: Married  Tobacco Use  . Smoking status: Never  . Smokeless tobacco: Never  Vaping Use  . Vaping status: Never Used  Substance and Sexual Activity  . Alcohol use: Yes  . Drug use: Never   Social Drivers of Corporate investment banker Strain: Low Risk  (03/29/2023)   Received from [facility]   Overall Financial Resource Strain (CARDIA)   . Difficulty of Paying Living Expenses: Not hard at all  Food Insecurity: No Food Insecurity (03/29/2023)   Received from [facility]   Hunger Vital Sign   . Worried About Programme researcher, broadcasting/film/video in the Last Year: Never true   . Ran Out of Food  in the Last Year: Never true  Transportation Needs: No Transportation Needs (03/29/2023)   Received from [facility] - Transportation   . Lack of Transportation (Medical): No   . Lack of Transportation (Non-Medical): No  Physical Activity: Sufficiently Active (03/29/2023)   Received from [facility]   Exercise Vital Sign   . Days of Exercise per Week: 5 days   . Minutes of Exercise per Session: 30 min  Stress: No Stress Concern Present (03/29/2023)   Received from [facility] - Occupational Stress Questionnaire   . Feeling of Stress : Not at all  Social Connections: Moderately Integrated (03/29/2023)   Received from [facility]   Social Connection and Isolation Panel [NHANES]   . Frequency of  Communication with Friends and Family: More than three times a week   . Frequency of Social Gatherings with Friends and Family: Three times a week   . Attends Religious Services: 1 to 4 times per year   . Active Member of Clubs or Organizations: No   . Attends Banker Meetings: Never   . Marital Status: Married  Housing Stability: Unknown (07/23/2023)   Housing Stability Vital Sign   . Homeless in the Last Year: No    Objective:   Vitals:   07/23/23 0924  BP: (!) 145/70  Pulse: 83  Temp: 36.7 C (98 F)  SpO2: 95%  Weight: 55.9 kg (123 lb 3.2 oz)  Height: 149.9 cm (4' 11)  PainSc: 0-No pain  PainLoc: Breast    Body mass index is 24.88 kg/m.  Physical Exam Exam conducted with a chaperone present.  Chest:  Breasts:    Left: Normal.       Comments: Tram  4 cm area of induration and thickness   Lymphadenopathy:     Upper Body:     Right upper body: No supraclavicular or axillary adenopathy.     Left upper body: No supraclavicular or axillary adenopathy.  Skin:    General: Skin is warm.  Neurological:     General: No focal deficit present.  Psychiatric:        Mood and Affect: Mood normal.        Labs, Imaging and Diagnostic Testing:  CLINICAL DATA:  Patient with history of malignant right mastectomy 2015 with subsequent tram flap reconstruction. Patient states 6 month history of palpable hard area right axilla. She also states diffuse decreased in size right mastectomy site.  EXAM: ULTRASOUND GUIDED RIGHT BREAST CORE NEEDLE BIOPSY  COMPARISON:  Previous exam(s).  PROCEDURE: I met with the patient and we discussed the procedure of ultrasound-guided biopsy, including benefits and alternatives. We discussed the high likelihood of a successful procedure. We discussed the risks of the procedure, including infection, bleeding, tissue injury, clip migration, and inadequate sampling. Informed written consent was given. The usual time-out protocol  was performed immediately prior to the procedure.  Lesion quadrant: Lower right axilla.  Using sterile technique and 1% Lidocaine  as local anesthetic, under direct ultrasound visualization, a 14 gauge spring-loaded device was used to perform biopsy of the targeted heterogeneous hypoechoic region over the lower right axilla using an inferolateral to superomedial approach. Three core tissue specimens were obtained. At the conclusion of the procedure a HydroMARK shaped tissue marker clip was deployed into the biopsy cavity. Follow up 2 view mammogram was not performed.  IMPRESSION: Ultrasound guided biopsy of indeterminate abnormal heterogeneous tissue over the lower right axilla. No apparent  complications.  Electronically Signed: By: Toribio Agreste M.D. On: 07/17/2023 08:36 Addendum  Addendum by Agreste Toribio, MD on 07/19/2023  3:19 PM EDT ADDENDUM REPORT: 07/19/2023 13:19  ADDENDUM: Pathology revealed GRADE II INVASIVE MAMMARY CARCINOMA of the RIGHT lower axilla, (hydromark clip). This was found to be concordant by Dr. Toribio Agreste.  Pathology results were discussed with the patient by telephone by Dr. Earlis Ranks on Jul 18, 2023. I contacted the patient by telephone on Jul 19, 2023. The patient reported doing well after the biopsy with tenderness at the site. Post biopsy instructions and care were reviewed and questions were answered. The patient was encouraged to call [facility] for any additional concerns. My direct phone number was provided.  Surgical consultation has been arranged with Dr. Debby Shipper at [facility] on July 23, 2023.  Medical oncology consultation was arranged with Dr. Amber Stalls at [facility] on July 24, 2023.  Recommendation for a bilateral breast MRI given diffuse heterogeneity with indistinct hypoechoic areas and prior history of RIGHT mastectomy with TRAM flap  reconstruction.  Pathology results reported by Hendricks Benders, RN on 07/19/2023.   Electronically Signed   By: Toribio Agreste M.D.   On: 07/19/2023 13:19 Procedure Note  Agreste Toribio, MD - 07/19/2023 Formatting of this note might be different from the original. CLINICAL DATA:  Patient with history of malignant right mastectomy 2015 with subsequent tram flap reconstruction. Patient states 6 month history of palpable hard area right axilla. She also states diffuse decreased in size right mastectomy site.  EXAM: ULTRASOUND GUIDED RIGHT BREAST CORE NEEDLE BIOPSY  COMPARISON:  Previous exam(s).  PROCEDURE: I met with the patient and we discussed the procedure of ultrasound-guided biopsy, including benefits and alternatives. We discussed the high likelihood of a successful procedure. We discussed the risks of the procedure, including infection, bleeding, tissue injury, clip migration, and inadequate sampling. Informed written consent was given. The usual time-out protocol was performed immediately prior to the procedure.  Lesion quadrant: Lower right axilla.  Using sterile technique and 1% Lidocaine  as local anesthetic, under direct ultrasound visualization, a 14 gauge spring-loaded device was used to perform biopsy of the targeted heterogeneous hypoechoic region over the lower right axilla using an inferolateral to superomedial approach. Three core tissue specimens were obtained. At the conclusion of the procedure a HydroMARK shaped tissue marker clip was deployed into the biopsy cavity. Follow up 2 view mammogram was not performed.  IMPRESSION: Ultrasound guided biopsy of indeterminate abnormal heterogeneous tissue over the lower right axilla. No apparent complications.  Electronically Signed: By: Toribio Agreste M.D. On: 07/17/2023 08:36 Exam End: 07/17/23 08:34 Last Resulted: 07/17/23 08:36  Received From: [facility]  Result Received: 07/23/23 09:29  CLINICAL DATA:   Patient presents due to heart palpable area for 6 months over the right axilla. History of previous right malignant mastectomy 2015 with subsequent tram flap reconstruction.  EXAM: ULTRASOUND OF THE RIGHT AXILLA AND MASTECTOMY SITE  COMPARISON:  None available.  FINDINGS: On physical exam,there is a ill-defined moderate hard palpable area over the mid to inferior aspect of the right axilla. 3 x 3 cm pinkish/red area on the skin also in this region. Skin is intact.  Ultrasound is performed, showing no focal abnormality over the mastectomy site.  Ultrasound over the right axillary region is very difficult due to tight skin folds creating air and shadowing by ultrasound. Exam demonstrates 2 lymph nodes which are normal per morphology  as 1 demonstrates borderline thickened cortex of 3.5 mm. There is diffuse heterogeneity with indistinct hypoechoic areas in shadowing throughout the mid to inferior aspect of the right axilla. No distinct focal mass to allow measurements. The most focal hypoechoic area will be targeted for biopsy over the inferior axilla.  IMPRESSION: 1. Diffuse heterogeneity with indistinct hypoechoic areas and shadowing throughout the mid to inferior aspect of the right axilla likely indicating an infiltrative process. No distinct focal mass visualized. 2. No focal abnormality over the entire right mastectomy site.  RECOMMENDATION: Recommend ultrasound-guided core needle biopsy the over the abnormal heterogeneous region of the mid to inferior right axilla corresponding to patient's firm palpable region.  I have discussed the findings and recommendations with the patient. If applicable, a reminder letter will be sent to the patient regarding the next appointment.  BI-RADS CATEGORY  4: Suspicious.  Biopsy will be done today.   Electronically Signed   By: Toribio Agreste M.D.   On: 07/17/2023 08:43 Exam End: 07/17/23 08:16 Last Resulted: 07/17/23 08:43   Received From: [facility]  Result Received: 07/23/23 09:29  Accession #: [id number]  Patient Name: Penny Howard, Penny Howard  Visit # : [id number]   MRN: [id number]  Physician: Agreste Share  DOB/Age [birth year] (Age: 35) Gender: F  Collected Date: 07/17/2023  Received Date: 07/17/2023   FINAL DIAGNOSIS        1. Breast, right, needle core biopsy,  :       - INVASIVE MAMMARY CARCINOMA, SEE NOTE.       - TUBULE FORMATION: SCORE 3       - NUCLEAR PLEOMORPHISM: SCORE 2       - MITOTIC COUNT: SCORE 2       - TOTAL SCORE: 7       - OVERALL GRADE: 2       - LYMPHOVASCULAR INVASION: NOT IDENTIFIED       - CANCER LENGTH: 10 MM       - CALCIFICATIONS: NOT IDENTIFIED       - DUCTAL CARCINOMA IN SITU: NOT IDENTIFIED       - SEE NOTE.        Diagnosis Note : The neoplastic cells are positive for cytokeratin AE1/AE3 and       GATA3 and are negative for CD45. This pattern of immunoreactivity supports the       diagnosis of invasive mammary carcinoma. Stain controls worked appropriately. An       E-cadherin stain will be performed for further classification of the mammary       carcinoma and reported in an addendum.       These results were communicated to Hendricks Benders, RN at [facility]       [place] on 07/18/2023.       ER, PR, HER2, and Ki-67 will be performed on block 1A and reported in an       addendum.       This case underwent intradepartmental consultation and Dr. Rebbecca concurs with       the interpretation.      CORRECTED  [phone]:  The mammary carcinoma is negative for e-cadherin supporting classification as invasive lobular carcinoma with a solid pattern. Stain controls worked  appropriately.   DATE SIGNED OUT: 07/18/2023  ELECTRONIC SIGNATURE : Janel Md, Rexene , Pathologist, Electronic Signature   MICROSCOPIC DESCRIPTION   CASE COMMENTS  STAINS USED IN DIAGNOSIS:  GATA-3  H&E  Universal Negative Control-DAB  H&E-3  H&E-4  *  RECUT 1 SLIDE  H&E-2  CK  AE1AE3  CD45 (LCA)  Universal Negative Control-DAB  Her2 FISH  Stains used in diagnosis 1 E-CAD, 1 PR-ACIS, 1 Her2 by IHC, 1 ER-ACIS, 1  KI-67-ACIS  Some of these immunohistochemical stains may have been developed and the  performance characteristics determined by [facility].  Some may  not have been cleared or approved by the U.S. Food and Drug Administration.  The  FDA has determined that such clearance or approval is not necessary.  This test  is used for clinical purposes.  It should not be regarded as investigational or  for research.  This laboratory is certified under the Clinical Laboratory  Improvement Amendments of 1988 (CLIA-88) as qualified to perform high complexity  clinical laboratory testing.  PR progesterone receptor (16), immunohistochemical stains are performed on  formalin fixed, paraffin embedded tissue using a 3,3-diaminobenzidine (DAB)  chromogen and Leica Bond Autostainer System.  The staining intensity of the  nucleus is scored manually and is reported as the percentage of tumor cell  nuclei demonstrating specific nuclear staining.Specimens are fixed in 10%  Neutral Buffered Formalin for at least 6 hours and up to 72 hours. These tests  have not be validated on decalcified tissue.  Results should be interpreted with  caution given the possibility of false negative results on decalcified  specimens.  IHC scores are reported using ASCO/CAP scoring criteria.  An IHC Score of 0 or  1+  is NEGATIVE for HER2, 3+ is POSITIVE for HER2, and 2+ is EQUIVOCAL.  Equivocal results are reflexed to either FISH or IHC testing. Specimens are  fixed in 10% Neutral Buffered Formalin for at least 6 hours and up to 72 hours.  These tests have not be validated on decalcified tissue.  Results should be  interpreted with caution given the possibility of false negative results on  decalcified specimens. Antibody Clone for HER2 is 4B5 ([facility]). Some of these   immunohistochemical stains may have been developed and the performance  characteristics determined by [facility].  Some may not have been  cleared or approved by the U.S. Food and Drug Administration.  The FDA has  determined that such clearance or approval is not necessary.  This test is used  for clinical purposes.  It should not be regarded as investigational or for  research.  This laboratory is certified under the Clinical Laboratory  Improvement Amendments of 1988 (CLIA-88) as qualified to perform high complexity  clinical laboratory testing.  Estrogen receptor (6F11), immunohistochemical stains are performed on formalin  fixed, paraffin embedded tissue using a 3,3-diaminobenzidine (DAB) chromogen  and Leica Bond Autostainer System.  The staining intensity of the nucleus is  scored manually and is reported as the percentage of tumor cell nuclei  demonstrating specific nuclear staining.Specimens are fixed in 10% Neutral  Buffered Formalin for at least 6 hours and up to 72 hours.  These tests have not  be validated on decalcified tissue.  Results should be interpreted with caution  given the possibility of false negative results on decalcified specimens.  Ki-67 (MM1), immunohistochemical stains are performed on formalin fixed,  paraffin embedded tissue using a 3,3-diaminobenzidine (DAB) chromogen and Leica  Bond Autostainer System.  The staining intensity of the nucleus is scored  manually and is reported as the percentage of tumor cell nuclei demonstrating  specific nuclear staining.Specimens are fixed in 10% Neutral Buffered Formalin  for at least 6 hours and up to 72 hours. These tests  have not be validated on  decalcified tissue.  Results should be interpreted with caution given the  possibility of false negative results on decalcified specimens.   ADDENDUM  Breast, right, needle core biopsy  PROGNOSTIC INDICATORS  Results:  IMMUNOHISTOCHEMICAL AND MORPHOMETRIC  ANALYSIS PERFORMED MANUALLY  The tumor cells are equivocal  for Her2 (2+).  Estrogen Receptor:  90%, positive, strong staining intensity  Progesterone Receptor:  90%, positive, strong staining intensity  Proliferation Marker Ki67: 15%  COMMENT:  The negative hormone receptor study(ies) in this case has an internal positive control.   REFERENCE RANGE ESTROGEN RECEPTOR  NEGATIVE     0%  POSITIVE       =>1%  REFERENCE RANGE PROGESTERONE RECEPTOR  NEGATIVE     0%  POSITIVE        =>1%  All controls stained appropriately  Pepper Dutton Md, Pathologist, Electronic Signature  ( Signed 05 29 2025)    CLINICAL HISTORY   SPECIMEN(S) OBTAINED  1. Breast, right, needle core biopsy,   SPECIMEN COMMENTS:  1. In formalin: 8:22, CIT < 30 sec, right malignant mast 2015 with tram flap  SPECIMEN CLINICAL INFORMATION:  1. Right axillary diffuse heterogeneous area w/shadowing, suspect recurren  breast Ca and less likely scarring/fat necrosis     Gross Description  1. Received in formalin labeled with the patient's name [facility]) and  Rt axilla are four pieces of yellow-tan fibrofatty tissue ranging from 0.5 x  0.2 x 0.2 cm to 2.1 x 0.2 x 0.2 cm, submitted in toto in a single cassette. TIF  08:22, CIT <30 seconds.               (LEF, 07/17/2023)         Report signed out from the following location(s)  [facility]  [address] [place], [place] [address] CLIA #: [id number]   [facility]  [address] [place], [place] [address] CLIA #: [id number]  Assessment and Plan:     Diagnoses and all orders for this visit:  Recurrent breast cancer, right (CMS/HHS-HCC) -     MRI breast bilateral with and without contrast; Future     Unclear if this is true recurrence or new cancer.  Refer to medical oncology and radiation oncology  Recommend wide excision.  Also given location, would recommend removing any abnormal lymph nodes.  This could be a  new primary given the time but also it is a lobular subtype consistent with the first even though it does not appear to be identical.  Risks and benefits of surgery reviewed.  Recommend MRI for surgical planning.  Will contact plastic surgery to verify no other surgical needs given her TRAM flap.  Discussed risk of bleeding, infection, organ injury, nerve injury, the need for revisional surgery and/or reexcision.   DEBBY CURTISTINE SHIPPER, MD    I spent a total of 55 minutes in both face-to-face and non-face-to-face activities, excluding procedures performed, for this visit on the date of this encounter.

## 2023-07-23 NOTE — Telephone Encounter (Signed)
 Verbally confirmed appt for 6/3

## 2023-07-24 ENCOUNTER — Inpatient Hospital Stay: Attending: Hematology and Oncology | Admitting: Hematology and Oncology

## 2023-07-24 ENCOUNTER — Inpatient Hospital Stay

## 2023-07-24 VITALS — BP 137/54 | HR 67 | Temp 98.3°F | Resp 17 | Ht 59.0 in | Wt 122.9 lb

## 2023-07-24 DIAGNOSIS — Z803 Family history of malignant neoplasm of breast: Secondary | ICD-10-CM | POA: Diagnosis not present

## 2023-07-24 DIAGNOSIS — Z170 Estrogen receptor positive status [ER+]: Secondary | ICD-10-CM

## 2023-07-24 DIAGNOSIS — C773 Secondary and unspecified malignant neoplasm of axilla and upper limb lymph nodes: Secondary | ICD-10-CM | POA: Insufficient documentation

## 2023-07-24 DIAGNOSIS — C50411 Malignant neoplasm of upper-outer quadrant of right female breast: Secondary | ICD-10-CM | POA: Diagnosis not present

## 2023-07-24 DIAGNOSIS — Z806 Family history of leukemia: Secondary | ICD-10-CM | POA: Insufficient documentation

## 2023-07-24 DIAGNOSIS — Z1732 Human epidermal growth factor receptor 2 negative status: Secondary | ICD-10-CM | POA: Insufficient documentation

## 2023-07-24 DIAGNOSIS — Z1721 Progesterone receptor positive status: Secondary | ICD-10-CM | POA: Diagnosis not present

## 2023-07-24 DIAGNOSIS — C50911 Malignant neoplasm of unspecified site of right female breast: Secondary | ICD-10-CM | POA: Diagnosis not present

## 2023-07-24 DIAGNOSIS — Z9011 Acquired absence of right breast and nipple: Secondary | ICD-10-CM | POA: Diagnosis not present

## 2023-07-24 DIAGNOSIS — Z800 Family history of malignant neoplasm of digestive organs: Secondary | ICD-10-CM | POA: Diagnosis not present

## 2023-07-24 DIAGNOSIS — Z90710 Acquired absence of both cervix and uterus: Secondary | ICD-10-CM | POA: Diagnosis not present

## 2023-07-24 DIAGNOSIS — I10 Essential (primary) hypertension: Secondary | ICD-10-CM | POA: Insufficient documentation

## 2023-07-24 DIAGNOSIS — K219 Gastro-esophageal reflux disease without esophagitis: Secondary | ICD-10-CM | POA: Diagnosis not present

## 2023-07-24 DIAGNOSIS — Z807 Family history of other malignant neoplasms of lymphoid, hematopoietic and related tissues: Secondary | ICD-10-CM | POA: Diagnosis not present

## 2023-07-24 DIAGNOSIS — C7951 Secondary malignant neoplasm of bone: Secondary | ICD-10-CM | POA: Diagnosis not present

## 2023-07-24 LAB — CBC WITH DIFFERENTIAL/PLATELET
Abs Immature Granulocytes: 0.03 10*3/uL (ref 0.00–0.07)
Basophils Absolute: 0 10*3/uL (ref 0.0–0.1)
Basophils Relative: 1 %
Eosinophils Absolute: 0.2 10*3/uL (ref 0.0–0.5)
Eosinophils Relative: 3 %
HCT: 37.4 % (ref 36.0–46.0)
Hemoglobin: 12.2 g/dL (ref 12.0–15.0)
Immature Granulocytes: 1 %
Lymphocytes Relative: 22 %
Lymphs Abs: 1.5 10*3/uL (ref 0.7–4.0)
MCH: 30.7 pg (ref 26.0–34.0)
MCHC: 32.6 g/dL (ref 30.0–36.0)
MCV: 94 fL (ref 80.0–100.0)
Monocytes Absolute: 0.7 10*3/uL (ref 0.1–1.0)
Monocytes Relative: 11 %
Neutro Abs: 4.2 10*3/uL (ref 1.7–7.7)
Neutrophils Relative %: 62 %
Platelets: 270 10*3/uL (ref 150–400)
RBC: 3.98 MIL/uL (ref 3.87–5.11)
RDW: 12.8 % (ref 11.5–15.5)
WBC: 6.7 10*3/uL (ref 4.0–10.5)
nRBC: 0 % (ref 0.0–0.2)

## 2023-07-24 LAB — CMP (CANCER CENTER ONLY)
ALT: 17 U/L (ref 0–44)
AST: 36 U/L (ref 15–41)
Albumin: 4.9 g/dL (ref 3.5–5.0)
Alkaline Phosphatase: 58 U/L (ref 38–126)
Anion gap: 10 (ref 5–15)
BUN: 15 mg/dL (ref 8–23)
CO2: 25 mmol/L (ref 22–32)
Calcium: 10.4 mg/dL — ABNORMAL HIGH (ref 8.9–10.3)
Chloride: 104 mmol/L (ref 98–111)
Creatinine: 0.78 mg/dL (ref 0.44–1.00)
GFR, Estimated: >60 mL/min (ref >60)
Glucose, Bld: 91 mg/dL (ref 70–99)
Potassium: 4.1 mmol/L (ref 3.5–5.1)
Sodium: 139 mmol/L (ref 135–145)
Total Bilirubin: 0.4 mg/dL (ref 0.0–1.2)
Total Protein: 8.2 g/dL — ABNORMAL HIGH (ref 6.5–8.1)

## 2023-07-24 NOTE — Assessment & Plan Note (Signed)
 Assessment and Plan Assessment & Plan Recurrent lobular breast cancer Recurrent lobular breast cancer in the axillary region, confirmed by biopsy with same histology and receptor status. MRI crucial for assessing extent due to lobular histology. CT and bone scans suggested to rule out mets given recurrence. Surgery preferred if feasible; anti-estrogen therapy considered if surgery extensive or cannot be performed upfront. Radiation therapy likely will be planned post-surgery to reduce recurrence risk. Long-term anti-estrogen therapy considered post-treatment. - Order MRI of the breast to assess tumor extent. - Order CT scan and bone scan for metastasis evaluation. - Discuss case at breast cancer conference for treatment strategy. - Coordinate with Dr. Afton Horse regarding surgical options. - Consider anti-estrogen therapy if surgery can't be done upfront. - Plan radiation therapy post-surgery to reduce recurrence risk. - Discuss potential for long-term anti-estrogen therapy post-treatment.  Hypertension Hypertension is well-controlled.  Osteoporosis Osteoporosis is noted.  Hyperlipidemia Hyperlipidemia is noted.  NASH (Nonalcoholic steatohepatitis) NASH is noted.  Follow-up Follow-up plans discussed for timely coordination of care and treatment.

## 2023-07-24 NOTE — Progress Notes (Signed)
 [facility] CONSULT NOTE  Patient Care Team: Eliodoro Guerin, DO as PCP - General (Family Medicine) Tami Falcon, MD as Consulting Physician (Gastroenterology) Candyce Champagne, MD as Consulting Physician (General Surgery) Sergio Dandy, MD as Consulting Physician (Gastroenterology) Alger Infield, MD as Consulting Physician (Plastic Surgery) Hyland Mailman, MD as Referring Physician (Optometry) Carlene Che, MD as Consulting Physician (Vascular Surgery) Sim Dryer, MD as Consulting Physician (General Surgery) Zurie Platas, MD as Consulting Physician (Hematology and Oncology)  CHIEF COMPLAINTS/PURPOSE OF CONSULTATION:  Newly diagnosed breast cancer  HISTORY OF PRESENTING ILLNESS:  Penny Howard 78 y.o. female is here because of recent diagnosis of right breast cancer  I reviewed her records extensively and collaborated the history with the patient.  SUMMARY OF ONCOLOGIC HISTORY: Oncology History  Malignant neoplasm of upper-outer quadrant of right breast in female, estrogen receptor positive (HCC)  06/05/2013 Initial Diagnosis   Malignant neoplasm of upper-outer quadrant of right breast in female, estrogen receptor positive (HCC)   06/11/2013 Cancer Staging   Staging form: Breast, AJCC 7th Edition - Clinical: Stage IIA (T2, N0, cM0) - Signed by Murleen Arms, MD on 07/24/2023 Laterality: Right Prognostic indicators: ER/PR +    HER 2 -   Staging comments: Staged at breast conference 06/11/13.    Recurrent breast cancer, right (HCC)  07/17/2023 Breast US    Diffuse heterogeneity with indistinct hypoechoic areas and shadowing throughout the mid to inferior aspect of the right axilla likely indicating an infiltrative process. No distinct focal mass visualized. No focal abnormality over the entire right mastectomy site.   07/17/2023 Pathology Results   Right breast needle core biopsy showed grade 2 invasive lobular cancer, ER +90% strong  staining PR 90% positive strong staining, HER2 negative by FISH and Ki-67 of 15%   07/24/2023 Initial Diagnosis   Recurrent breast cancer, right (HCC)   07/24/2023 Cancer Staging   Staging form: Breast, AJCC 8th Edition - Clinical: Stage Unknown (cTX, cNX, cM0, G2, ER+, PR+, HER2-) - Signed by Murleen Arms, MD on 07/24/2023 Histologic grading system: 3 grade system    Discussed the use of AI scribe software for clinical note transcription with the patient, who gave verbal consent to proceed.  History of Present Illness Penny Howard is a 78 year old female with a history of stage two lobular breast cancer who presents with a recurrence of breast cancer. She was referred by Dr. Afton Horse for further evaluation and management of her breast cancer recurrence.  In 2015, she was diagnosed with stage two lobular breast cancer, which was estrogen and progesterone positive and HER2 negative. She underwent a mastectomy and reconstruction, followed by five years of tamoxifen  therapy. She did not receive chemotherapy or radiation at that time.  Over the past six months, she has experienced changes in her reconstructed breast, including a diminishing appearance and a hard area under her arm. An ultrasound and biopsy confirmed a recurrence of breast cancer. She is currently awaiting an MRI to further evaluate the extent of the recurrence.  Her medical history includes high blood pressure, osteoporosis, high cholesterol, and nonalcoholic steatohepatitis (NASH). She has undergone multiple breast-related surgeries, including a mastectomy and reconstruction, as well as a partial hysterectomy. She has never smoked and is retired from the [employer] after 41 years of service.  Her family history is significant for her mother having had breast cancer at age 5, who underwent a mastectomy and lived for 25 more years without radiation or  chemotherapy.  No pain is associated with the current  breast cancer recurrence.   MEDICAL HISTORY:  Past Medical History:  Diagnosis Date   Abnormal glucose tolerance test 05/02/2011   Allergy    Anxiety    Bruit of right carotid artery 07/18/2018   Cancer (HCC)    breast rt   Family history of breast cancer    Family history of leukemia    Family history of non-Hodgkin's lymphoma    Family history of pancreatic cancer    Family history of stomach cancer    GERD (gastroesophageal reflux disease)    High cholesterol    Hypertension    Liver fibrosis    Osteoporosis    Other malaise and fatigue 02/02/2010   Squamous cell carcinoma    Stress incontinence    Unspecified urinary incontinence 11/03/2004   Wears contact lenses     SURGICAL HISTORY: Past Surgical History:  Procedure Laterality Date   ABDOMINAL HYSTERECTOMY  1990   ovaries intact b/l   BLADDER SURGERY  10   mesh   BREAST BIOPSY Right 07/17/2023   US  RT BREAST BX W LOC DEV 1ST LESION IMG BX SPEC US  GUIDE 07/17/2023 [facility] MAMMOGRAP[facility]   COLONOSCOPY     LIPOSUCTION WITH LIPOFILLING Right 04/24/2014   Procedure: LIPOFILLING TO RIGHT CHEST/RIGHT NIPPLE AREOLA COMPLEX CREATION WITH FULL THICKNESS SKIN GRAFT/REVISION OF RIGHT TRANSFLAP;  Surgeon: Alger Infield, MD;  Location: [facility];  Service: Plastics;  Laterality: Right;   MASTECTOMY Right 07/04/2013   MASTECTOMY W/ SENTINEL NODE BIOPSY  07/04/2013   RT TOTAL            DR Alray Askew   SCAR REVISION Right 04/24/2014   Procedure: SCAR REVISION;  Surgeon: Alger Infield, MD;  Location: [facility];  Service: Plastics;  Laterality: Right;   SIMPLE MASTECTOMY WITH AXILLARY SENTINEL NODE BIOPSY Right 07/04/2013   Procedure: RIGHT TOTAL MASTECTOMY WITH RIGHT  AXILLARY SENTINEL LYMPH NODE BIOPSY;  Surgeon: Quitman Bucy, MD;  Location: [facility];  Service: General;  Laterality: Right;   SKIN FULL THICKNESS GRAFT Right 04/24/2014   Procedure: SKIN GRAFT FULL THICKNESS;  Surgeon: Alger Infield, MD;   Location: [facility];  Service: Plastics;  Laterality: Right;   SQUAMOUS CELL CARCINOMA EXCISION Right    Lower Leg   TRAM Right 12/30/2013   Procedure: TRANSVERSE RECTUS ABDOMINIS MYOCUTANEOUS (TRAM) FLAP FOR RIGHT BREAST RECONSTRUCTION;  Surgeon: Alger Infield, MD;  Location: [facility];  Service: Plastics;  Laterality: Right;    SOCIAL HISTORY: Social History   Socioeconomic History   Marital status: Married    Spouse name: Not on file   Number of children: 2   Years of education: 13   Highest education level: Some college, no degree  Occupational History   Occupation: retired  Tobacco Use   Smoking status: Never   Smokeless tobacco: Never  Vaping Use   Vaping status: Never Used  Substance and Sexual Activity   Alcohol use: Yes    Alcohol/week: 5.0 standard drinks of alcohol    Types: 5 Glasses of wine per week    Comment: occaionally   Drug use: No   Sexual activity: Yes  Other Topics Concern   Not on file  Social History Narrative   Not on file   Social Drivers of Health   Financial Resource Strain: Low Risk  (03/29/2023)   Overall Financial Resource Strain (CARDIA)    Difficulty of Paying Living Expenses: Not  hard at all  Food Insecurity: No Food Insecurity (03/29/2023)   Hunger Vital Sign    Worried About Running Out of Food in the Last Year: Never true    Ran Out of Food in the Last Year: Never true  Transportation Needs: No Transportation Needs (03/29/2023)   PRAPARE - Administrator, Civil Service (Medical): No    Lack of Transportation (Non-Medical): No  Physical Activity: Sufficiently Active (03/29/2023)   Exercise Vital Sign    Days of Exercise per Week: 5 days    Minutes of Exercise per Session: 30 min  Stress: No Stress Concern Present (03/29/2023)   [employer] of Occupational Health - Occupational Stress Questionnaire    Feeling of Stress : Not at all  Social Connections: Moderately Integrated (03/29/2023)   Social Connection  and Isolation Panel [NHANES]    Frequency of Communication with Friends and Family: More than three times a week    Frequency of Social Gatherings with Friends and Family: Three times a week    Attends Religious Services: 1 to 4 times per year    Active Member of Clubs or Organizations: No    Attends Banker Meetings: Never    Marital Status: Married  Catering manager Violence: Not At Risk (03/29/2023)   Humiliation, Afraid, Rape, and Kick questionnaire    Fear of Current or Ex-Partner: No    Emotionally Abused: No    Physically Abused: No    Sexually Abused: No    FAMILY HISTORY: Family History  Problem Relation Age of Onset   Breast cancer Mother 32   Heart failure Father    Breast cancer Paternal Aunt 54   Lymphoma Cousin 35       non- hodgkins lymphoma   Stomach cancer Maternal Uncle 18   Pancreatic cancer Other    Leukemia Other    Colon cancer Neg Hx    Rectal cancer Neg Hx    Esophageal cancer Neg Hx    Liver cancer Neg Hx     ALLERGIES:  is allergic to codeine.  MEDICATIONS:  Current Outpatient Medications  Medication Sig Dispense Refill   aspirin EC 81 MG tablet Take 81 mg by mouth daily.     cetirizine  (ZYRTEC ) 10 MG tablet Take 1 tablet by mouth once daily 100 tablet 3   escitalopram  (LEXAPRO ) 10 MG tablet Take 1 tablet (10 mg total) by mouth daily. 100 tablet 3   esomeprazole  (NEXIUM ) 20 MG capsule Take 1 capsule (20 mg total) by mouth daily. 100 capsule 3   fluticasone  (FLONASE ) 50 MCG/ACT nasal spray Place 1 spray into both nostrils 2 (two) times daily. 16 g 2   lisinopril  (ZESTRIL ) 10 MG tablet Take 1 tablet (10 mg total) by mouth daily. 100 tablet 3   multivitamin-lutein (OCUVITE-LUTEIN) CAPS capsule Take 1 capsule by mouth daily. 250 mg capsule     rosuvastatin  (CRESTOR ) 10 MG tablet Take 1 tablet (10 mg total) by mouth daily. Change in therapy 100 tablet 3   traZODone  (DESYREL ) 50 MG tablet TAKE 1/2 TO 2 TABLETS AT   BEDTIME AS NEEDED FOR  SLEEP 100 tablet 3   vitamin E 180 MG (400 UNITS) capsule Take 400 Units by mouth daily.     No current facility-administered medications for this visit.     PHYSICAL EXAMINATION: ECOG PERFORMANCE STATUS: 0 - Asymptomatic  Vitals:   07/24/23 1459  BP: (!) 137/54  Pulse: 67  Resp: 17  Temp: 98.3 F (36.8  C)  SpO2: 98%   Filed Weights   07/24/23 1459  Weight: 122 lb 14.4 oz (55.7 kg)    GENERAL:alert, no distress and comfortable No cervical or supraclavicular adenopathy.  There is a poorly 5 cm area of induration and thickness extending from the right axilla to the posterior right side chest wall.  LABORATORY DATA:  I have reviewed the data as listed Lab Results  Component Value Date   WBC 6.7 07/24/2023   HGB 12.2 07/24/2023   HCT 37.4 07/24/2023   MCV 94.0 07/24/2023   PLT 270 07/24/2023   Lab Results  Component Value Date   NA 139 11/28/2022   K 5.1 11/28/2022   CL 102 11/28/2022   CO2 20 11/28/2022    RADIOGRAPHIC STUDIES: I have personally reviewed the radiological reports and agreed with the findings in the report.  ASSESSMENT AND PLAN:  Recurrent breast cancer, right (HCC) Assessment and Plan Assessment & Plan Recurrent lobular breast cancer Recurrent lobular breast cancer in the axillary region, confirmed by biopsy with same histology and receptor status. MRI crucial for assessing extent due to lobular histology. CT and bone scans suggested to rule out mets given recurrence. Surgery preferred if feasible; anti-estrogen therapy considered if surgery extensive or cannot be performed upfront. Radiation therapy likely will be planned post-surgery to reduce recurrence risk. Long-term anti-estrogen therapy considered post-treatment. - Order MRI of the breast to assess tumor extent. - Order CT scan and bone scan for metastasis evaluation. - Discuss case at breast cancer conference for treatment strategy. - Coordinate with Dr. Afton Horse regarding surgical  options. - Consider anti-estrogen therapy if surgery can't be done upfront. - Plan radiation therapy post-surgery to reduce recurrence risk. - Discuss potential for long-term anti-estrogen therapy post-treatment.  Hypertension Hypertension is well-controlled.  Osteoporosis Osteoporosis is noted.  Hyperlipidemia Hyperlipidemia is noted.  NASH (Nonalcoholic steatohepatitis) NASH is noted.  Follow-up Follow-up plans discussed for timely coordination of care and treatment.     All questions were answered. The patient knows to call the clinic with any problems, questions or concerns.    Murleen Arms, MD 07/24/23

## 2023-07-25 ENCOUNTER — Telehealth: Payer: Self-pay

## 2023-07-25 ENCOUNTER — Other Ambulatory Visit (HOSPITAL_COMMUNITY): Payer: Self-pay

## 2023-07-25 DIAGNOSIS — Z170 Estrogen receptor positive status [ER+]: Secondary | ICD-10-CM

## 2023-07-25 LAB — CANCER ANTIGEN 27.29: CA 27.29: 146.4 U/mL — ABNORMAL HIGH (ref 0.0–38.6)

## 2023-07-25 LAB — CANCER ANTIGEN 15-3: CA 15-3: 77.4 U/mL — ABNORMAL HIGH (ref 0.0–25.0)

## 2023-07-25 NOTE — Research (Signed)
 Trial:  [employer] 2021-05 - Specimen Collection Study to Evaluate Biomarkers in Subjects with Cancer  Patient Penny Howard was identified by Dr Arno Bibles as a potential candidate for the above listed study.  This Clinical Research Nurse spoke with FLORNCE RECORD, [id number], on 07/25/23 in a manner and location that ensures patient privacy to discuss participation in the above listed research study (called her at phone number listed in chart).  A copy of the informed consent document with embedded HIPAA language was emailed to the patient at her request; confirmed email address in demographics.  Patient reads, speaks, and understands [name].   Patient was encouraged to contact the research team with any questions.  Approximately 10 minutes were spent with the patient reviewing the informed consent documents.  Patient was provided the option of taking informed consent documents home to review and was encouraged to review at their convenience with their support network, including other care providers.  Plan made to follow up by phone with patient on Monday 07/30/23 between 1100-1200. [place] Antara Brecheisen, RN, BSN, [facility]  Her  [facility]ers Clinical Research Nurse [facility] Direct Dial [phone] 07/25/2023 2:52 PM

## 2023-07-25 NOTE — Telephone Encounter (Signed)
 Pharmacy Patient Advocate Encounter   Received notification from Onbase that prior authorization for Nexium   is required/requested.   Insurance verification completed.   The patient is insured through [employer] .   Per test claim: Refill too soon. PA is not needed at this time. Medication was filled 05/11/23. Next eligible fill date is 05/10/24.  Patient has quantity limit of 100 capsules per 365 days

## 2023-07-30 ENCOUNTER — Inpatient Hospital Stay: Admitting: Hematology and Oncology

## 2023-07-30 DIAGNOSIS — C50911 Malignant neoplasm of unspecified site of right female breast: Secondary | ICD-10-CM

## 2023-07-30 DIAGNOSIS — Z90710 Acquired absence of both cervix and uterus: Secondary | ICD-10-CM

## 2023-07-30 DIAGNOSIS — Z806 Family history of leukemia: Secondary | ICD-10-CM

## 2023-07-30 DIAGNOSIS — Z170 Estrogen receptor positive status [ER+]: Secondary | ICD-10-CM | POA: Diagnosis not present

## 2023-07-30 DIAGNOSIS — Z803 Family history of malignant neoplasm of breast: Secondary | ICD-10-CM | POA: Diagnosis not present

## 2023-07-30 DIAGNOSIS — C50411 Malignant neoplasm of upper-outer quadrant of right female breast: Secondary | ICD-10-CM | POA: Diagnosis not present

## 2023-07-30 DIAGNOSIS — Z1732 Human epidermal growth factor receptor 2 negative status: Secondary | ICD-10-CM | POA: Diagnosis not present

## 2023-07-30 DIAGNOSIS — Z800 Family history of malignant neoplasm of digestive organs: Secondary | ICD-10-CM

## 2023-07-30 DIAGNOSIS — Z1721 Progesterone receptor positive status: Secondary | ICD-10-CM

## 2023-07-30 DIAGNOSIS — Z807 Family history of other malignant neoplasms of lymphoid, hematopoietic and related tissues: Secondary | ICD-10-CM | POA: Diagnosis not present

## 2023-07-30 NOTE — Progress Notes (Unsigned)
 [facility] CONSULT NOTE  Patient Care Team: Eliodoro Guerin, DO as PCP - General (Family Medicine) Tami Falcon, MD as Consulting Physician (Gastroenterology) Candyce Champagne, MD as Consulting Physician (General Surgery) Sergio Dandy, MD as Consulting Physician (Gastroenterology) Alger Infield, MD as Consulting Physician (Plastic Surgery) Hyland Mailman, MD as Referring Physician (Optometry) Carlene Che, MD as Consulting Physician (Vascular Surgery) Sim Dryer, MD as Consulting Physician (General Surgery) Mahamadou Weltz, MD as Consulting Physician (Hematology and Oncology)  CHIEF COMPLAINTS/PURPOSE OF CONSULTATION:  Newly diagnosed breast cancer  HISTORY OF PRESENTING ILLNESS:  Penny Howard 78 y.o. female is here because of recent diagnosis of right breast cancer  I reviewed her records extensively and collaborated the history with the patient.  SUMMARY OF ONCOLOGIC HISTORY: Oncology History  Malignant neoplasm of upper-outer quadrant of right breast in female, estrogen receptor positive (HCC)  06/05/2013 Initial Diagnosis   Malignant neoplasm of upper-outer quadrant of right breast in female, estrogen receptor positive (HCC)   06/11/2013 Cancer Staging   Staging form: Breast, AJCC 7th Edition - Clinical: Stage IIA (T2, N0, cM0) - Signed by Murleen Arms, MD on 07/24/2023 Laterality: Right Prognostic indicators: ER/PR +    HER 2 -   Staging comments: Staged at breast conference 06/11/13.    Recurrent breast cancer, right (HCC)  07/17/2023 Breast US    Diffuse heterogeneity with indistinct hypoechoic areas and shadowing throughout the mid to inferior aspect of the right axilla likely indicating an infiltrative process. No distinct focal mass visualized. No focal abnormality over the entire right mastectomy site.   07/17/2023 Pathology Results   Right breast needle core biopsy showed grade 2 invasive lobular cancer, ER +90% strong  staining PR 90% positive strong staining, HER2 negative by FISH and Ki-67 of 15%   07/24/2023 Initial Diagnosis   Recurrent breast cancer, right (HCC)   07/24/2023 Cancer Staging   Staging form: Breast, AJCC 8th Edition - Clinical: Stage Unknown (cTX, cNX, cM0, G2, ER+, PR+, HER2-) - Signed by Murleen Arms, MD on 07/24/2023 Histologic grading system: 3 grade system    Discussed the use of AI scribe software for clinical note transcription with the patient, who gave verbal consent to proceed.  History of Present Illness Penny Howard is a 78 year old female with a history of stage two lobular breast cancer who presents with a recurrence of breast cancer.   This is a follow up telephone visit.   MEDICAL HISTORY:  Past Medical History:  Diagnosis Date   Abnormal glucose tolerance test 05/02/2011   Allergy    Anxiety    Bruit of right carotid artery 07/18/2018   Cancer (HCC)    breast rt   Family history of breast cancer    Family history of leukemia    Family history of non-Hodgkin's lymphoma    Family history of pancreatic cancer    Family history of stomach cancer    GERD (gastroesophageal reflux disease)    High cholesterol    Hypertension    Liver fibrosis    Osteoporosis    Other malaise and fatigue 02/02/2010   Squamous cell carcinoma    Stress incontinence    Unspecified urinary incontinence 11/03/2004   Wears contact lenses     SURGICAL HISTORY: Past Surgical History:  Procedure Laterality Date   ABDOMINAL HYSTERECTOMY  1990   ovaries intact b/l   BLADDER SURGERY  10   mesh   BREAST BIOPSY Right 07/17/2023   US   RT BREAST BX W LOC DEV 1ST LESION IMG BX SPEC US  GUIDE 07/17/2023 [facility] MAMMOGRAPHY   COLONOSCOPY     LIPOSUCTION WITH LIPOFILLING Right 04/24/2014   Procedure: LIPOFILLING TO RIGHT CHEST/RIGHT NIPPLE AREOLA COMPLEX CREATION WITH FULL THICKNESS SKIN GRAFT/REVISION OF RIGHT TRANSFLAP;  Surgeon: Alger Infield, MD;  Location: [facility];   Service: Plastics;  Laterality: Right;   MASTECTOMY Right 07/04/2013   MASTECTOMY W/ SENTINEL NODE BIOPSY  07/04/2013   RT TOTAL            DR Alray Askew   SCAR REVISION Right 04/24/2014   Procedure: SCAR REVISION;  Surgeon: Alger Infield, MD;  Location: [facility];  Service: Plastics;  Laterality: Right;   SIMPLE MASTECTOMY WITH AXILLARY SENTINEL NODE BIOPSY Right 07/04/2013   Procedure: RIGHT TOTAL MASTECTOMY WITH RIGHT  AXILLARY SENTINEL LYMPH NODE BIOPSY;  Surgeon: Quitman Bucy, MD;  Location: [facility];  Service: General;  Laterality: Right;   SKIN FULL THICKNESS GRAFT Right 04/24/2014   Procedure: SKIN GRAFT FULL THICKNESS;  Surgeon: Alger Infield, MD;  Location: [facility];  Service: Plastics;  Laterality: Right;   SQUAMOUS CELL CARCINOMA EXCISION Right    Lower Leg   TRAM Right 12/30/2013   Procedure: TRANSVERSE RECTUS ABDOMINIS MYOCUTANEOUS (TRAM) FLAP FOR RIGHT BREAST RECONSTRUCTION;  Surgeon: Alger Infield, MD;  Location: [facility];  Service: Plastics;  Laterality: Right;    SOCIAL HISTORY: Social History   Socioeconomic History   Marital status: Married    Spouse name: Not on file   Number of children: 2   Years of education: 13   Highest education level: Some college, no degree  Occupational History   Occupation: retired  Tobacco Use   Smoking status: Never   Smokeless tobacco: Never  Vaping Use   Vaping status: Never Used  Substance and Sexual Activity   Alcohol use: Yes    Alcohol/week: 5.0 standard drinks of alcohol    Types: 5 Glasses of wine per week    Comment: occaionally   Drug use: No   Sexual activity: Yes  Other Topics Concern   Not on file  Social History Narrative   Not on file   Social Drivers of Health   Financial Resource Strain: Low Risk  (03/29/2023)   Overall Financial Resource Strain (CARDIA)    Difficulty of Paying Living Expenses: Not hard at all  Food Insecurity: No Food Insecurity (03/29/2023)   Hunger  Vital Sign    Worried About Running Out of Food in the Last Year: Never true    Ran Out of Food in the Last Year: Never true  Transportation Needs: No Transportation Needs (03/29/2023)   PRAPARE - Administrator, Civil Service (Medical): No    Lack of Transportation (Non-Medical): No  Physical Activity: Sufficiently Active (03/29/2023)   Exercise Vital Sign    Days of Exercise per Week: 5 days    Minutes of Exercise per Session: 30 min  Stress: No Stress Concern Present (03/29/2023)   [employer] of Occupational Health - Occupational Stress Questionnaire    Feeling of Stress : Not at all  Social Connections: Moderately Integrated (03/29/2023)   Social Connection and Isolation Panel [NHANES]    Frequency of Communication with Friends and Family: More than three times a week    Frequency of Social Gatherings with Friends and Family: Three times a week    Attends Religious Services: 1 to 4 times per year  Active Member of Clubs or Organizations: No    Attends Banker Meetings: Never    Marital Status: Married  Catering manager Violence: Not At Risk (03/29/2023)   Humiliation, Afraid, Rape, and Kick questionnaire    Fear of Current or Ex-Partner: No    Emotionally Abused: No    Physically Abused: No    Sexually Abused: No    FAMILY HISTORY: Family History  Problem Relation Age of Onset   Breast cancer Mother 31   Heart failure Father    Breast cancer Paternal Aunt 24   Lymphoma Cousin 35       non- hodgkins lymphoma   Stomach cancer Maternal Uncle 18   Pancreatic cancer Other    Leukemia Other    Colon cancer Neg Hx    Rectal cancer Neg Hx    Esophageal cancer Neg Hx    Liver cancer Neg Hx     ALLERGIES:  is allergic to codeine.  MEDICATIONS:  Current Outpatient Medications  Medication Sig Dispense Refill   aspirin EC 81 MG tablet Take 81 mg by mouth daily.     cetirizine  (ZYRTEC ) 10 MG tablet Take 1 tablet by mouth once daily 100 tablet 3    escitalopram  (LEXAPRO ) 10 MG tablet Take 1 tablet (10 mg total) by mouth daily. 100 tablet 3   esomeprazole  (NEXIUM ) 20 MG capsule Take 1 capsule (20 mg total) by mouth daily. 100 capsule 3   fluticasone  (FLONASE ) 50 MCG/ACT nasal spray Place 1 spray into both nostrils 2 (two) times daily. 16 g 2   lisinopril  (ZESTRIL ) 10 MG tablet Take 1 tablet (10 mg total) by mouth daily. 100 tablet 3   multivitamin-lutein (OCUVITE-LUTEIN) CAPS capsule Take 1 capsule by mouth daily. 250 mg capsule     rosuvastatin  (CRESTOR ) 10 MG tablet Take 1 tablet (10 mg total) by mouth daily. Change in therapy 100 tablet 3   traZODone  (DESYREL ) 50 MG tablet TAKE 1/2 TO 2 TABLETS AT   BEDTIME AS NEEDED FOR SLEEP 100 tablet 3   vitamin E 180 MG (400 UNITS) capsule Take 400 Units by mouth daily.     No current facility-administered medications for this visit.     PHYSICAL EXAMINATION: ECOG PERFORMANCE STATUS: 0 - Asymptomatic  There were no vitals filed for this visit.  There were no vitals filed for this visit.  PE deferred in lieu of counseling  LABORATORY DATA:  I have reviewed the data as listed Lab Results  Component Value Date   WBC 6.7 07/24/2023   HGB 12.2 07/24/2023   HCT 37.4 07/24/2023   MCV 94.0 07/24/2023   PLT 270 07/24/2023   Lab Results  Component Value Date   NA 139 07/24/2023   K 4.1 07/24/2023   CL 104 07/24/2023   CO2 25 07/24/2023    RADIOGRAPHIC STUDIES: I have personally reviewed the radiological reports and agreed with the findings in the report.  ASSESSMENT AND PLAN:    Recurrent lobular breast cancer Recurrent lobular breast cancer in the axillary region, confirmed by biopsy with same histology and receptor status.  - MRI breast, re staging scans scheduled. - discussed the case at [facility] - If no metastatic disease, Dr Afton Horse will take her for upfront surgery, - She will also be a candidate for post mastectomy radiation. We can consider oncotype testing and anti estrogens  after surgery and radiation respectively.    I connected with  [name] on 07/31/23 by a telephone application and  verified that I am speaking with the correct person using two identifiers.   I discussed the limitations of evaluation and management by telemedicine. The patient expressed understanding and agreed to proceed.   Location of pt: Home Location of physician: office.  All questions were answered. The patient knows to call the clinic with any problems, questions or concerns.    Murleen Arms, MD 07/30/23

## 2023-08-01 ENCOUNTER — Encounter (HOSPITAL_COMMUNITY)
Admission: RE | Admit: 2023-08-01 | Discharge: 2023-08-01 | Disposition: A | Discharge status: Home / Self Care | Source: Ambulatory Visit | Attending: Hematology and Oncology | Admitting: Hematology and Oncology

## 2023-08-01 DIAGNOSIS — C50411 Malignant neoplasm of upper-outer quadrant of right female breast: Secondary | ICD-10-CM | POA: Insufficient documentation

## 2023-08-01 DIAGNOSIS — Z170 Estrogen receptor positive status [ER+]: Secondary | ICD-10-CM | POA: Diagnosis not present

## 2023-08-01 DIAGNOSIS — C50911 Malignant neoplasm of unspecified site of right female breast: Secondary | ICD-10-CM | POA: Diagnosis not present

## 2023-08-01 MED ORDER — TECHNETIUM TC 99M MEDRONATE IV KIT
22.0000 | PACK | Freq: Once | INTRAVENOUS | Status: AC
  Administered 2023-08-01: 22 via INTRAVENOUS

## 2023-08-06 ENCOUNTER — Ambulatory Visit (HOSPITAL_COMMUNITY)
Admission: RE | Admit: 2023-08-06 | Discharge: 2023-08-06 | Disposition: A | Discharge status: Home / Self Care | Source: Ambulatory Visit | Attending: Hematology and Oncology | Admitting: Hematology and Oncology

## 2023-08-06 ENCOUNTER — Inpatient Hospital Stay

## 2023-08-06 DIAGNOSIS — C50911 Malignant neoplasm of unspecified site of right female breast: Secondary | ICD-10-CM

## 2023-08-06 DIAGNOSIS — J90 Pleural effusion, not elsewhere classified: Secondary | ICD-10-CM | POA: Diagnosis not present

## 2023-08-06 DIAGNOSIS — C50411 Malignant neoplasm of upper-outer quadrant of right female breast: Secondary | ICD-10-CM | POA: Insufficient documentation

## 2023-08-06 DIAGNOSIS — C773 Secondary and unspecified malignant neoplasm of axilla and upper limb lymph nodes: Secondary | ICD-10-CM | POA: Diagnosis not present

## 2023-08-06 DIAGNOSIS — Z170 Estrogen receptor positive status [ER+]: Secondary | ICD-10-CM | POA: Diagnosis not present

## 2023-08-06 DIAGNOSIS — Z1721 Progesterone receptor positive status: Secondary | ICD-10-CM | POA: Diagnosis not present

## 2023-08-06 DIAGNOSIS — K8020 Calculus of gallbladder without cholecystitis without obstruction: Secondary | ICD-10-CM | POA: Diagnosis not present

## 2023-08-06 DIAGNOSIS — C50919 Malignant neoplasm of unspecified site of unspecified female breast: Secondary | ICD-10-CM | POA: Diagnosis not present

## 2023-08-06 DIAGNOSIS — C7951 Secondary malignant neoplasm of bone: Secondary | ICD-10-CM | POA: Diagnosis not present

## 2023-08-06 DIAGNOSIS — R590 Localized enlarged lymph nodes: Secondary | ICD-10-CM | POA: Diagnosis not present

## 2023-08-06 DIAGNOSIS — Z1732 Human epidermal growth factor receptor 2 negative status: Secondary | ICD-10-CM | POA: Diagnosis not present

## 2023-08-06 LAB — CBC WITH DIFFERENTIAL/PLATELET
Abs Immature Granulocytes: 0.02 10*3/uL (ref 0.00–0.07)
Basophils Absolute: 0.1 10*3/uL (ref 0.0–0.1)
Basophils Relative: 1 %
Eosinophils Absolute: 0.1 10*3/uL (ref 0.0–0.5)
Eosinophils Relative: 1 %
HCT: 35.4 % — ABNORMAL LOW (ref 36.0–46.0)
Hemoglobin: 11.6 g/dL — ABNORMAL LOW (ref 12.0–15.0)
Immature Granulocytes: 0 %
Lymphocytes Relative: 17 %
Lymphs Abs: 1.5 10*3/uL (ref 0.7–4.0)
MCH: 30.9 pg (ref 26.0–34.0)
MCHC: 32.8 g/dL (ref 30.0–36.0)
MCV: 94.1 fL (ref 80.0–100.0)
Monocytes Absolute: 0.9 10*3/uL (ref 0.1–1.0)
Monocytes Relative: 10 %
Neutro Abs: 6.2 10*3/uL (ref 1.7–7.7)
Neutrophils Relative %: 71 %
Platelets: 276 10*3/uL (ref 150–400)
RBC: 3.76 MIL/uL — ABNORMAL LOW (ref 3.87–5.11)
RDW: 12.8 % (ref 11.5–15.5)
WBC: 8.7 10*3/uL (ref 4.0–10.5)
nRBC: 0 % (ref 0.0–0.2)

## 2023-08-06 LAB — RESEARCH LABS

## 2023-08-06 MED ORDER — SODIUM CHLORIDE (PF) 0.9 % IJ SOLN
INTRAMUSCULAR | Status: AC
Start: 2023-08-06 — End: 2023-08-06
  Filled 2023-08-06: qty 50

## 2023-08-06 MED ORDER — IOHEXOL 300 MG/ML  SOLN
100.0000 mL | Freq: Once | INTRAMUSCULAR | Status: AC | PRN
  Administered 2023-08-06: 80 mL via INTRAVENOUS

## 2023-08-06 NOTE — Research (Signed)
 [employer] 2021-05 - Specimen Collection Study to Evaluate Biomarkers in Subjects with Cancer    This Coordinator has reviewed this patient's inclusion and exclusion criteria as a second review and confirms Penny Howard is eligible for study participation.  Patient may continue with enrollment.   Tiersa Dayley, Ph.D. Clinical Research Coordinator [phone] 08/06/2023 2:16 PM

## 2023-08-06 NOTE — Research (Addendum)
 Eligibility: Eligibility criteria reviewed with patient. This nurse/coordinator has reviewed this patient's inclusion and exclusion criteria and confirmed patient is eligible for study participation. Eligibility confirmed by treating investigator, who also agrees that patient should proceed with enrollment. Patient will continue with enrollment.  Data Collection: Patient was interviewed to collect the following information.  Medical History:  High Blood Pressure  Yes Coronary Artery Disease No Lupus    No Rheumatoid Arthritis  No Diabetes   No     Lynch Syndrome  No  Is the patient currently taking a magnesium  supplement?   No  Does the patient have a personal history of cancer (greater than 5 years ago)?  Yes If yes, Cancer type and date of diagnosis?   Breast, 2015  Has this previous diagnosis been treated? Yes      If so, treatment type? Mastectomy and reconstruction Start and end dates of last treatment cycle? 2015  Does the patient have a family history of cancer in 1st or 2nd degree relatives? Yes If yes, Relationship(s) and Cancer type(s)? Mother: breast; Aunt: breast; Cousin: NHL  Does the patient have history of alcohol consumption? Yes   If yes, current or former? Current Number of years? 20, on and off; rare drinking Drinks per week? 1  Does the patient have history of cigarette, cigar, pipe, or chewing tobacco use?  No   Blood Collection: Research blood obtained by fresh venipuncture (or Port a Cath per patient's preference). Patient tolerated well without any adverse events.  Gift Card: $50 gift card given to patient for her participation in this study.    Patient was thanked for their participation in this study.    Victory Gravel Idamae Coccia, RN, BSN, [facility] She  Her  Hers Clinical Research Nurse [facility] Direct Dial [phone] 08/06/2023 2:07 PM

## 2023-08-06 NOTE — Research (Signed)
 Trial: [employer] 2021-05 - Specimen Collection Study to Evaluate Biomarkers in Subjects with Cancer   Patient Penny Howard was identified by Dr Arno Bibles as a potential candidate for the above listed study.  This Clinical Research Nurse met with Penny Howard, [id number], on 08/06/23 in a manner and location that ensures patient privacy to discuss participation in the above listed research study.  Patient is Accompanied by her husband.  A copy of the informed consent document with embedded HIPAA language was provided to the patient.  Patient reads, speaks, and understands [name].    Patient was provided with the business card of this Nurse and encouraged to contact the research team with any questions.  Patient was provided the option of taking informed consent documents home to review and was encouraged to review at their convenience with their support network, including other care providers. Patient is comfortable with making a decision regarding study participation today.  As outlined in the informed consent form, this Nurse and [name] discussed the purpose of the research study, the investigational nature of the study, study procedures and requirements for study participation, potential risks and benefits of study participation, as well as alternatives to participation. This study is not blinded. The patient understands participation is voluntary and they may withdraw from study participation at any time.  This study does not involve randomization.  This study does not involve an investigational drug or device. This study does not involve a placebo. Patient understands enrollment is pending full eligibility review.   Confidentiality and how the patient's information will be used as part of study participation were discussed.  Patient was informed there is reimbursement provided for their time and effort spent on trial participation.  The patient is encouraged to discuss research  study participation with their insurance provider to determine what costs they may incur as part of study participation, including research related injury.    All questions were answered to patient's satisfaction.  The informed consent with embedded HIPAA language was reviewed page by page.  The patient's mental and emotional status is appropriate to provide informed consent, and the patient verbalizes an understanding of study participation.  Patient has agreed to participate in the above listed research study and has voluntarily signed the informed consent version 05 Mar 2020 with embedded HIPAA language, version 05 Mar 2020 on 08/06/23 at 1400PM.  The patient was provided with a copy of the signed informed consent form with embedded HIPAA language for their reference.  No study specific procedures were obtained prior to the signing of the informed consent document.  Approximately 15 minutes were spent with the patient reviewing the informed consent documents.  Patient was not requested to complete a Release of Information form.  [name], RN, BSN, [facility] She  Her  Hers Clinical Research Nurse [facility] Direct Dial [phone] 08/06/2023 3:28 PM

## 2023-08-09 ENCOUNTER — Encounter: Payer: Self-pay | Admitting: Hematology and Oncology

## 2023-08-09 ENCOUNTER — Telehealth: Payer: Self-pay

## 2023-08-09 ENCOUNTER — Telehealth: Payer: Self-pay | Admitting: Pharmacy Technician

## 2023-08-09 ENCOUNTER — Inpatient Hospital Stay (HOSPITAL_BASED_OUTPATIENT_CLINIC_OR_DEPARTMENT_OTHER): Admitting: Hematology and Oncology

## 2023-08-09 ENCOUNTER — Other Ambulatory Visit (HOSPITAL_COMMUNITY): Payer: Self-pay

## 2023-08-09 VITALS — BP 130/64 | HR 68 | Temp 97.9°F | Resp 17 | Wt 121.6 lb

## 2023-08-09 DIAGNOSIS — C50911 Malignant neoplasm of unspecified site of right female breast: Secondary | ICD-10-CM | POA: Diagnosis not present

## 2023-08-09 DIAGNOSIS — C7951 Secondary malignant neoplasm of bone: Secondary | ICD-10-CM

## 2023-08-09 DIAGNOSIS — Z170 Estrogen receptor positive status [ER+]: Secondary | ICD-10-CM | POA: Diagnosis not present

## 2023-08-09 DIAGNOSIS — C50919 Malignant neoplasm of unspecified site of unspecified female breast: Secondary | ICD-10-CM | POA: Diagnosis not present

## 2023-08-09 DIAGNOSIS — C773 Secondary and unspecified malignant neoplasm of axilla and upper limb lymph nodes: Secondary | ICD-10-CM | POA: Diagnosis not present

## 2023-08-09 DIAGNOSIS — Z1721 Progesterone receptor positive status: Secondary | ICD-10-CM | POA: Diagnosis not present

## 2023-08-09 DIAGNOSIS — C50411 Malignant neoplasm of upper-outer quadrant of right female breast: Secondary | ICD-10-CM | POA: Diagnosis not present

## 2023-08-09 DIAGNOSIS — Z1732 Human epidermal growth factor receptor 2 negative status: Secondary | ICD-10-CM | POA: Diagnosis not present

## 2023-08-09 MED ORDER — LETROZOLE 2.5 MG PO TABS: 2.5000 mg | ORAL_TABLET | Freq: Every day | ORAL | 3 refills | Status: AC

## 2023-08-09 MED ORDER — ABEMACICLIB 100 MG PO TABS: 100.0000 mg | ORAL_TABLET | Freq: Two times a day (BID) | ORAL | 3 refills | Status: DC

## 2023-08-09 NOTE — Telephone Encounter (Signed)
 Called pt and she was offered appt with Dr Arno Bibles for today at 2 PM. Pt accepted and her 6/26 appt was r/s.

## 2023-08-09 NOTE — Telephone Encounter (Signed)
 Oral Oncology Patient Advocate Encounter   Received notification from [facility]/new rx that prior authorization for Verzenio is required/requested.   Insurance verification completed.   The patient is insured through [employer] .   Per test claim: PA required; PA started via CoverMyMeds. KEY [id number] . Please see clinical question(s) below that I am not finding the answer to in their chart and advise.  Will this medication be used as monotherapy? - if no, Will this medication be used in combination with an aromatase inhibitor as initial endocrine-based therapy? Aromatase inhibitors include: Arimidex (anastrozole), Aromasin (exemestane), and Femara (letrozole).* If yes: Has the patient experienced disease progression following endocrine therapy and prior chemotherapy in the metastatic setting?

## 2023-08-09 NOTE — Progress Notes (Signed)
 [facility] CONSULT NOTE  Patient Care Team: Eliodoro Guerin, DO as PCP - General (Family Medicine) Tami Falcon, MD as Consulting Physician (Gastroenterology) Candyce Champagne, MD as Consulting Physician (General Surgery) Sergio Dandy, MD as Consulting Physician (Gastroenterology) Alger Infield, MD as Consulting Physician (Plastic Surgery) Hyland Mailman, MD as Referring Physician (Optometry) Carlene Che, MD as Consulting Physician (Vascular Surgery) Sim Dryer, MD as Consulting Physician (General Surgery) Adaora Mchaney, MD as Consulting Physician (Hematology and Oncology)  CHIEF COMPLAINTS/PURPOSE OF CONSULTATION:  Newly diagnosed breast cancer  HISTORY OF PRESENTING ILLNESS:  Penny Howard 78 y.o. female is here because of recent diagnosis of right breast cancer  I reviewed her records extensively and collaborated the history with the patient.  SUMMARY OF ONCOLOGIC HISTORY: Oncology History  Malignant neoplasm of upper-outer quadrant of right breast in female, estrogen receptor positive (HCC)  06/05/2013 Initial Diagnosis   Malignant neoplasm of upper-outer quadrant of right breast in female, estrogen receptor positive (HCC)   06/11/2013 Cancer Staging   Staging form: Breast, AJCC 7th Edition - Clinical: Stage IIA (T2, N0, cM0) - Signed by Murleen Arms, MD on 07/24/2023 Laterality: Right Prognostic indicators: ER/PR +    HER 2 -   Staging comments: Staged at breast conference 06/11/13.    Recurrent breast cancer, right (HCC)  07/17/2023 Breast US    Diffuse heterogeneity with indistinct hypoechoic areas and shadowing throughout the mid to inferior aspect of the right axilla likely indicating an infiltrative process. No distinct focal mass visualized. No focal abnormality over the entire right mastectomy site.   07/17/2023 Pathology Results   Right breast needle core biopsy showed grade 2 invasive lobular cancer, ER +90% strong  staining PR 90% positive strong staining, HER2 negative by FISH and Ki-67 of 15%   07/24/2023 Initial Diagnosis   Recurrent breast cancer, right (HCC)   07/24/2023 Cancer Staging   Staging form: Breast, AJCC 8th Edition - Clinical: Stage Unknown (cTX, cNX, cM0, G2, ER+, PR+, HER2-) - Signed by Murleen Arms, MD on 07/24/2023 Histologic grading system: 3 grade system    Discussed the use of AI scribe software for clinical note transcription with the patient, who gave verbal consent to proceed.  History of Present Illness Penny Howard is a 78 year old female with a history of stage two lobular breast cancer who presents with a recurrence of breast cancer.     Penny Howard is a 78 year old female with breast cancer who presents for evaluation of suspected metastatic disease. She is accompanied by her spouse.  She has a history of breast cancer and underwent a mastectomy. Recent imaging, including CT scans of the chest, abdomen, and pelvis, has shown an anterior mediastinal mass, destruction of the right rib, lymph nodes in the armpit, and an enhancing soft tissue mass in the same area. Additionally, there are bone lesions noted in the thoracic spine and lower lumbar vertebra.  Despite the imaging findings, she has no rib or bone pain. A bone scan shows bright spots indicating uptake in the right side, second rib, backbone, and hip. Tumor markers drawn recently have returned high.  She is currently not experiencing any significant symptoms such as pain, and she maintains an active lifestyle, attending exercise classes and walking regularly  She has been married for 58 years and has a supportive family environment.  Rest of the pertinent 10 point ROS reviewed and neg.   MEDICAL HISTORY:  Past Medical History:  Diagnosis Date   Abnormal glucose tolerance test 05/02/2011   Allergy    Anxiety    Bruit of right carotid artery 07/18/2018   Cancer (HCC)    breast rt   Family history  of breast cancer    Family history of leukemia    Family history of non-Hodgkin's lymphoma    Family history of pancreatic cancer    Family history of stomach cancer    GERD (gastroesophageal reflux disease)    High cholesterol    Hypertension    Liver fibrosis    Osteoporosis    Other malaise and fatigue 02/02/2010   Squamous cell carcinoma    Stress incontinence    Unspecified urinary incontinence 11/03/2004   Wears contact lenses     SURGICAL HISTORY: Past Surgical History:  Procedure Laterality Date   ABDOMINAL HYSTERECTOMY  1990   ovaries intact b/l   BLADDER SURGERY  10   mesh   BREAST BIOPSY Right 07/17/2023   US  RT BREAST BX W LOC DEV 1ST LESION IMG BX SPEC US  GUIDE 07/17/2023 [facility] MAMMOGRAPHY   COLONOSCOPY     LIPOSUCTION WITH LIPOFILLING Right 04/24/2014   Procedure: LIPOFILLING TO RIGHT CHEST/RIGHT NIPPLE AREOLA COMPLEX CREATION WITH FULL THICKNESS SKIN GRAFT/REVISION OF RIGHT TRANSFLAP;  Surgeon: Alger Infield, MD;  Location: [facility];  Service: Plastics;  Laterality: Right;   MASTECTOMY Right 07/04/2013   MASTECTOMY W/ SENTINEL NODE BIOPSY  07/04/2013   RT TOTAL            DR Alray Askew   SCAR REVISION Right 04/24/2014   Procedure: SCAR REVISION;  Surgeon: Alger Infield, MD;  Location: [facility];  Service: Plastics;  Laterality: Right;   SIMPLE MASTECTOMY WITH AXILLARY SENTINEL NODE BIOPSY Right 07/04/2013   Procedure: RIGHT TOTAL MASTECTOMY WITH RIGHT  AXILLARY SENTINEL LYMPH NODE BIOPSY;  Surgeon: Quitman Bucy, MD;  Location: [facility] OR;  Service: General;  Laterality: Right;   SKIN FULL THICKNESS GRAFT Right 04/24/2014   Procedure: SKIN GRAFT FULL THICKNESS;  Surgeon: Alger Infield, MD;  Location: [facility];  Service: Plastics;  Laterality: Right;   SQUAMOUS CELL CARCINOMA EXCISION Right    Lower Leg   TRAM Right 12/30/2013   Procedure: TRANSVERSE RECTUS ABDOMINIS MYOCUTANEOUS (TRAM) FLAP FOR RIGHT BREAST  RECONSTRUCTION;  Surgeon: Alger Infield, MD;  Location: [facility] OR;  Service: Plastics;  Laterality: Right;    SOCIAL HISTORY: Social History   Socioeconomic History   Marital status: Married    Spouse name: Not on file   Number of children: 2   Years of education: 13   Highest education level: Some college, no degree  Occupational History   Occupation: retired  Tobacco Use   Smoking status: Never   Smokeless tobacco: Never  Vaping Use   Vaping status: Never Used  Substance and Sexual Activity   Alcohol use: Yes    Alcohol/week: 5.0 standard drinks of alcohol    Types: 5 Glasses of wine per week    Comment: occaionally   Drug use: No   Sexual activity: Yes  Other Topics Concern   Not on file  Social History Narrative   Not on file   Social Drivers of Health   Financial Resource Strain: Low Risk  (03/29/2023)   Overall Financial Resource Strain (CARDIA)    Difficulty of Paying Living Expenses: Not hard at all  Food Insecurity: No Food Insecurity (03/29/2023)   Hunger Vital Sign    Worried About Running  Out of Food in the Last Year: Never true    Ran Out of Food in the Last Year: Never true  Transportation Needs: No Transportation Needs (03/29/2023)   PRAPARE - Administrator, Civil Service (Medical): No    Lack of Transportation (Non-Medical): No  Physical Activity: Sufficiently Active (03/29/2023)   Exercise Vital Sign    Days of Exercise per Week: 5 days    Minutes of Exercise per Session: 30 min  Stress: No Stress Concern Present (03/29/2023)   [employer] of Occupational Health - Occupational Stress Questionnaire    Feeling of Stress : Not at all  Social Connections: Moderately Integrated (03/29/2023)   Social Connection and Isolation Panel    Frequency of Communication with Friends and Family: More than three times a week    Frequency of Social Gatherings with Friends and Family: Three times a week    Attends Religious Services: 1 to 4 times per year     Active Member of Clubs or Organizations: No    Attends Banker Meetings: Never    Marital Status: Married  Catering manager Violence: Not At Risk (03/29/2023)   Humiliation, Afraid, Rape, and Kick questionnaire    Fear of Current or Ex-Partner: No    Emotionally Abused: No    Physically Abused: No    Sexually Abused: No    FAMILY HISTORY: Family History  Problem Relation Age of Onset   Breast cancer Mother 73   Heart failure Father    Breast cancer Paternal Aunt 67   Lymphoma Cousin 35       non- hodgkins lymphoma   Stomach cancer Maternal Uncle 18   Pancreatic cancer Other    Leukemia Other    Colon cancer Neg Hx    Rectal cancer Neg Hx    Esophageal cancer Neg Hx    Liver cancer Neg Hx     ALLERGIES:  is allergic to codeine.  MEDICATIONS:  Current Outpatient Medications  Medication Sig Dispense Refill   aspirin EC 81 MG tablet Take 81 mg by mouth daily.     cetirizine  (ZYRTEC ) 10 MG tablet Take 1 tablet by mouth once daily 100 tablet 3   escitalopram  (LEXAPRO ) 10 MG tablet Take 1 tablet (10 mg total) by mouth daily. 100 tablet 3   esomeprazole  (NEXIUM ) 20 MG capsule Take 1 capsule (20 mg total) by mouth daily. 100 capsule 3   fluticasone  (FLONASE ) 50 MCG/ACT nasal spray Place 1 spray into both nostrils 2 (two) times daily. 16 g 2   lisinopril  (ZESTRIL ) 10 MG tablet Take 1 tablet (10 mg total) by mouth daily. 100 tablet 3   multivitamin-lutein (OCUVITE-LUTEIN) CAPS capsule Take 1 capsule by mouth daily. 250 mg capsule     rosuvastatin  (CRESTOR ) 10 MG tablet Take 1 tablet (10 mg total) by mouth daily. Change in therapy 100 tablet 3   traZODone  (DESYREL ) 50 MG tablet TAKE 1/2 TO 2 TABLETS AT   BEDTIME AS NEEDED FOR SLEEP 100 tablet 3   vitamin E 180 MG (400 UNITS) capsule Take 400 Units by mouth daily.     No current facility-administered medications for this visit.     PHYSICAL EXAMINATION: ECOG PERFORMANCE STATUS: 0 - Asymptomatic  There were no vitals  filed for this visit.  There were no vitals filed for this visit.  PE deferred in lieu of counseling  LABORATORY DATA:  I have reviewed the data as listed Lab Results  Component Value Date  WBC 8.7 08/06/2023   HGB 11.6 (L) 08/06/2023   HCT 35.4 (L) 08/06/2023   MCV 94.1 08/06/2023   PLT 276 08/06/2023   Lab Results  Component Value Date   NA 139 07/24/2023   K 4.1 07/24/2023   CL 104 07/24/2023   CO2 25 07/24/2023    RADIOGRAPHIC STUDIES: I have personally reviewed the radiological reports and agreed with the findings in the report.  ASSESSMENT AND PLAN:   Assessment and Plan Assessment & Plan Metastatic Invasive lobular carcinoma, ER positive, PR positive, Her 2 neg. Metastatic breast cancer with local invasion and distant metastases, including anterior mediastinal mass with rib destruction, axillary lymph node involvement, and bone lesions. Estrogen and progesterone receptor-positive, HER2-negative. Systemic therapy preferred due to metastatic nature. Letrozole and abemaciclib chosen based on hormone receptor status. Radiation will be considered down the lane for palliative pain management. - Prescribe letrozole to be picked up from [facility] in [place]. - Prescribe abemaciclib (Verzenio) via mail order pharmacy after Medicare approval. - Monitor tumor markers monthly. - Perform imaging every three months. - Advise reporting of new pain for potential palliative radiation. - Recommend bone strengthener infusion every three months post dental clearance. - Provide dental clearance form. - Advise regular exercise and healthy diet with limited sugars  - Schedule follow-up in five weeks.  Gastroesophageal reflux disease (GERD) GERD managed with Nexium . Dyspepsia from cancer treatment may exacerbate symptoms. - Continue Nexium .  Time spent: 40 min   All questions were answered. The patient knows to call the clinic with any problems, questions or concerns.    Murleen Arms, MD 08/09/23

## 2023-08-10 ENCOUNTER — Other Ambulatory Visit (HOSPITAL_COMMUNITY): Payer: Self-pay

## 2023-08-10 NOTE — Telephone Encounter (Signed)
 Oral Oncology Patient Advocate Encounter  Prior Authorization for Verzenio has been approved.    PA# [id number]  Effective dates: 07/11/23 through 08/09/24  Patients co-pay is $4,661.94. (Co-pay is available, but patient will need to activate via text message or email. Oral Oncology Clinic can obtain while on the phone with patient.)  Patient must fill through [facility] per insurance.     Penny Howard, CPhT Supervisor Pharmacy Patient Advocate [facility] [phone] (Ph) 08/10/2023 8:16 AM

## 2023-08-12 ENCOUNTER — Encounter: Payer: Self-pay | Admitting: Family Medicine

## 2023-08-13 ENCOUNTER — Encounter: Payer: Self-pay | Admitting: Hematology and Oncology

## 2023-08-13 ENCOUNTER — Other Ambulatory Visit: Payer: Self-pay

## 2023-08-13 ENCOUNTER — Other Ambulatory Visit

## 2023-08-14 ENCOUNTER — Other Ambulatory Visit (HOSPITAL_COMMUNITY): Payer: Self-pay

## 2023-08-15 ENCOUNTER — Inpatient Hospital Stay: Admitting: Pharmacist

## 2023-08-15 ENCOUNTER — Encounter: Payer: Self-pay | Admitting: Pharmacist

## 2023-08-15 VITALS — BP 132/64 | HR 54 | Temp 97.7°F | Resp 17 | Wt 119.7 lb

## 2023-08-15 DIAGNOSIS — Z170 Estrogen receptor positive status [ER+]: Secondary | ICD-10-CM | POA: Diagnosis not present

## 2023-08-15 DIAGNOSIS — Z1732 Human epidermal growth factor receptor 2 negative status: Secondary | ICD-10-CM | POA: Diagnosis not present

## 2023-08-15 DIAGNOSIS — C7951 Secondary malignant neoplasm of bone: Secondary | ICD-10-CM | POA: Diagnosis not present

## 2023-08-15 DIAGNOSIS — Z1721 Progesterone receptor positive status: Secondary | ICD-10-CM | POA: Diagnosis not present

## 2023-08-15 DIAGNOSIS — C50911 Malignant neoplasm of unspecified site of right female breast: Secondary | ICD-10-CM

## 2023-08-15 DIAGNOSIS — C50411 Malignant neoplasm of upper-outer quadrant of right female breast: Secondary | ICD-10-CM | POA: Diagnosis not present

## 2023-08-15 DIAGNOSIS — C773 Secondary and unspecified malignant neoplasm of axilla and upper limb lymph nodes: Secondary | ICD-10-CM | POA: Diagnosis not present

## 2023-08-15 NOTE — Progress Notes (Signed)
 [facility]       Telephone: [phone]?Fax: [phone]   Oncology Clinical Pharmacist Practitioner Initial Assessment  Penny Howard is a 78 y.o. female with a diagnosis of breast cancer. They were contacted today via in-person visit. She is accompanied by her husband.  Indication/Regimen Abemaciclib  (Verzenio ) is being used appropriately for treatment of metastatic breast cancer by Dr. Amber Stalls.      Wt Readings from Last 1 Encounters:  08/09/23 121 lb 9.6 oz (55.2 kg)    Estimated body surface area is 1.52 meters squared as calculated from the following:   Height as of 07/24/23: 4' 11 (1.499 m).   Weight as of 08/09/23: 121 lb 9.6 oz (55.2 kg).  The dosing regimen is 100 mg by mouth every 12 hours on days 1 to 28 of a 28-day cycle. This is being given in combination with letrozole .   Abemaciclib  is planned to continue until disease progression or unacceptable toxicity. Prescription dose and frequency assessed for appropriateness.  Patient has agreed to treatment which is documented in physician note on 08/09/2023. Counseled patient on administration, dosing, side effects, monitoring, drug-food interactions, safe handling, storage, and disposal.  A bone strengthener was recommended by Dr. Stalls.  The patient needs to have a tooth extracted.  The date is tentatively August 28, 2023.  It is recommended that zoledronic acid or denosumab be held for at least 3 months after this procedure.  This was communicated to the patient and her husband today.   Dose Modifications Dr. Stalls is starting abemaciclib  at a reduced dose of 100 mg by mouth every 12 hours  Access Assessment Penny Howard will be receiving abemaciclib  through [facility] Insurance Concerns: None Start date if known: Patient will receive abemaciclib  tomorrow and start on 08/17/2023  Adherence Assessment Reviewed importance on keeping a med schedule and plan for any missed  doses Barriers to adherence identified?  No  Allergies Allergies  Allergen Reactions   Codeine Nausea Only   Vitals    08/09/2023    2:19 PM 07/24/2023    2:59 PM 04/10/2023   10:44 AM  Oncology Vitals  Height  150 cm   Weight 55.157 kg 55.747 kg   Weight (lbs) 121 lbs 10 oz 122 lbs 14 oz   BMI 24.56 kg/m2 24.82 kg/m2   Temp 97.9 F (36.6 C) 98.3 F (36.8 C)   Pulse Rate 68 67   BP 130/64 137/54 129/77  Resp 17 17   SpO2 100 % 98 %   BSA (m2) 1.52 m2 1.52 m2     Laboratory Data    Latest Ref Rng & Units 08/06/2023    2:18 PM 07/24/2023    4:05 PM 11/28/2022   12:01 PM  CBC EXTENDED  WBC 4.0 - 10.5 K/uL 8.7  6.7  6.2   RBC 3.87 - 5.11 MIL/uL 3.76  3.98  4.09   Hemoglobin 12.0 - 15.0 g/dL 88.3  87.7  87.0   HCT 36.0 - 46.0 % 35.4  37.4  39.1   Platelets 150 - 400 K/uL 276  270  329   NEUT# 1.7 - 7.7 K/uL 6.2  4.2    Lymph# 0.7 - 4.0 K/uL 1.5  1.5         Latest Ref Rng & Units 07/24/2023    4:05 PM 11/28/2022   12:01 PM 11/16/2021    1:38 PM  CMP  Glucose 70 - 99 mg/dL 91  95  96   BUN 8 - 23 mg/dL 15  12  18    Creatinine 0.44 - 1.00 mg/dL 9.21  9.15  9.09   Sodium 135 - 145 mmol/L 139  139  139   Potassium 3.5 - 5.1 mmol/L 4.1  5.1  5.2   Chloride 98 - 111 mmol/L 104  102  102   CO2 22 - 32 mmol/L 25  20  19    Calcium  8.9 - 10.3 mg/dL 89.5  89.4  89.2   Total Protein 6.5 - 8.1 g/dL 8.2  7.2  7.3   Total Bilirubin 0.0 - 1.2 mg/dL 0.4  0.3  0.4   Alkaline Phos 38 - 126 U/L 58  66  62   AST 15 - 41 U/L 36  26  18   ALT 0 - 44 U/L 17  15  16      Contraindications Contraindications were reviewed?  Yes Contraindications to therapy were identified?  No  Safety Precautions The following safety precautions for the use of abemaciclib  were reviewed:  Changes in kidney function: importance of drinking plenty of fluids and monitoring urine output Diarrhea: we reviewed that diarrhea is common with abemaciclib  and confirmed that she does have loperamide (Imodium) at home.   We reviewed how to take this medication PRN and gave her information on abemaciclib  Decreased white blood cells (WBCs) and increased risk for infection: we discussed the importance of having a thermometer and what the Centers for Disease Control and Prevention (CDC) considers a fever which is 100.71F (38C) or higher.  Gave patient 24/7 triage line to call if any fevers or symptoms Decreased hemoglobin, part of red blood cells that carry iron and oxygen Fatigue Nausea and Vomiting Hepatotoxicity: reviewed to contact clinic for RUQ pain that will not subside, yellowing of eyes/skin Decreased appetite or weight loss Abdominal pain Decreased platelet count and increased risk for bleeding Venous thromboembolism (VTE): reviewed signs of deep vein thrombosis (DVT) such as leg swelling, redness, pain, or tenderness and signs of pulmonary embolism (PE) such as shortness of breath, rapid or irregular heartbeat, cough, chest pain, or lightheadedness ILD/Pneumonitis: we reviewed potential symptoms including cough, shortness, and fatigue. Handling body fluids and waste Pregnancy, sexual activity, and contraception Avoid grapefruit products Reviewed to take the medication every 12 hours (with food sometimes can be easier on the stomach) and to take it at the same time every day. Discussed proper storage and handling of abemaciclib   Medication Reconciliation Current Outpatient Medications  Medication Sig Dispense Refill   abemaciclib  (VERZENIO ) 100 MG tablet Take 1 tablet (100 mg total) by mouth 2 (two) times daily. 56 tablet 3   aspirin EC 81 MG tablet Take 81 mg by mouth daily.     cetirizine  (ZYRTEC ) 10 MG tablet Take 1 tablet by mouth once daily 100 tablet 3   escitalopram  (LEXAPRO ) 10 MG tablet Take 1 tablet (10 mg total) by mouth daily. 100 tablet 3   esomeprazole  (NEXIUM ) 20 MG capsule Take 1 capsule (20 mg total) by mouth daily. 100 capsule 3   fluticasone  (FLONASE ) 50 MCG/ACT nasal spray Place 1  spray into both nostrils 2 (two) times daily. 16 g 2   letrozole  (FEMARA ) 2.5 MG tablet Take 1 tablet (2.5 mg total) by mouth daily. 90 tablet 3   lisinopril  (ZESTRIL ) 10 MG tablet Take 1 tablet (10 mg total) by mouth daily. 100 tablet 3   multivitamin-lutein (OCUVITE-LUTEIN) CAPS capsule Take 1 capsule by mouth daily. 250 mg capsule  rosuvastatin  (CRESTOR ) 10 MG tablet Take 1 tablet (10 mg total) by mouth daily. Change in therapy 100 tablet 3   traZODone  (DESYREL ) 50 MG tablet TAKE 1/2 TO 2 TABLETS AT   BEDTIME AS NEEDED FOR SLEEP 100 tablet 3   vitamin E 180 MG (400 UNITS) capsule Take 400 Units by mouth daily.     No current facility-administered medications for this visit.   Medication reconciliation is based on the patient's most recent medication list in the electronic medical record (EMR) including herbal products and OTC medications.   The patient's medication list was reviewed today with the patient? Yes   Drug-drug interactions (DDIs) DDIs were evaluated? Yes Significant DDIs identified? Possible increase of serotonin syndrome 1 on escitalopram  and trazodone . Patient has been on both of these medications for quite some time without any issues.  Escitalopram  may be increased when in combination with esomeprazole .  As above, patient has been taking both of these medications for quite some time without any issues  Drug-Food Interactions Drug-food interactions were evaluated?  Yes Drug-food interactions identified?  Grapefruit products  Follow-up Plan  Patient education handout given to patient Start abemaciclib  100 mg by mouth every 12 hours. Start date August 17, 2023 Continue letrozole  2.5 mg by mouth daily Per Dr. Loretha, start bone strengthener once tooth extraction has been completed.  Recommendations are to wait 3 months prior to starting.  Extraction tentatively on August 28, 2023 Monitor for toxicities Will add labs, pharmacy clinic visit, in 2 weeks Labs, Dr. Loretha visit, on  September 13, 2023 Prescribing information recommend labs every 2 weeks for the first 2 months, monthly for 2 months, and then as clinically indicated. Penny Howard can follow up with clinical pharmacy as deemed necessary by Dr. Amber Iruku going forward   Penny Howard participated in the discussion, expressed understanding, and voiced agreement with the above plan. All questions were answered to her satisfaction. The patient was advised to contact the clinic at [phone] with any questions or concerns prior to her return visit.   I spent 60 minutes assessing the patient.  Tiasia Weberg A. Howard, PharmD, BCOP, CPP  Penny Howard, RPH-CPP, 08/15/2023 10:58 AM  **Disclaimer: This note was dictated with voice recognition software. Similar sounding words can inadvertently be transcribed and this note may contain transcription errors which may not have been corrected upon publication of note.**

## 2023-08-16 ENCOUNTER — Inpatient Hospital Stay: Admitting: Hematology and Oncology

## 2023-08-23 ENCOUNTER — Ambulatory Visit: Admitting: Hematology and Oncology

## 2023-08-28 NOTE — Progress Notes (Unsigned)
 [facility]       Telephone: [phone]?Fax: [phone]   Oncology Clinical Pharmacist Practitioner Progress Note  Penny Howard was contacted via in-person to discuss her chemotherapy regimen for abemaciclib  which they receive under the care of Dr. Amber Stalls.  Current treatment regimen and start date Abemaciclib  (08/17/23) Letrozole  (08/09/23) Zoledronic Acid (TBD) -- having dental extraction on 09/04/23 and then will start 3 months after  Interval History She continues on abemaciclib  100 mg by mouth every 12 hours on days 1 to 28 of a 28-day cycle. This is being given in combination with letrozole . Therapy is planned to continue until disease progression or unacceptable toxicity. She was last seen by clinical pharmacy on 08/15/23 and Dr. Stalls on 08/09/23. She will see Dr. Stalls with labs again on 09/13/23. Tooth extraction is now scheduled for 09/04/23 so will likely schedule bone strengthener to start at end of October  Response to Therapy She is doing well. Some loose stool and took loperamide one time with resolution. She has some fatigue but no N/V. CMP was not drawn today but will draw next time she sees Dr. Stalls. WBC and ANC have gone down so will need to monitor closely and hold if ANC < 1000 cells/uL.  Labs, vitals, treatment parameters, and manufacturer guidelines assessing toxicity were reviewed with Penny Howard today. Based on these values, patient is in agreement to continue abemaciclib  therapy at this time.  Allergies Allergies  Allergen Reactions   Codeine Nausea Only    Vitals    08/29/2023   11:11 AM 08/15/2023   11:13 AM 08/09/2023    2:19 PM  Oncology Vitals  Height 150 cm    Weight 55.157 kg 54.296 kg 55.157 kg  Weight (lbs) 121 lbs 10 oz 119 lbs 11 oz 121 lbs 10 oz  BMI 24.56 kg/m2 24.18 kg/m2 24.56 kg/m2  Temp 97.6 F (36.4 C) 97.7 F (36.5 C) 97.9 F (36.6 C)  Pulse Rate 61 54 68  BP 139/64 132/64 130/64  Resp 18 17 17    SpO2 100 % 100 % 100 %  BSA (m2) 1.52 m2 1.5 m2 1.52 m2    Laboratory Data    Latest Ref Rng & Units 08/29/2023   10:28 AM 08/06/2023    2:18 PM 07/24/2023    4:05 PM  CBC EXTENDED  WBC 4.0 - 10.5 K/uL 3.2  8.7  6.7   RBC 3.87 - 5.11 MIL/uL 3.67  3.76  3.98   Hemoglobin 12.0 - 15.0 g/dL 88.6  88.3  87.7   HCT 36.0 - 46.0 % 35.2  35.4  37.4   Platelets 150 - 400 K/uL 220  276  270   NEUT# 1.7 - 7.7 K/uL 1.7  6.2  4.2   Lymph# 0.7 - 4.0 K/uL 1.2  1.5  1.5        Latest Ref Rng & Units 07/24/2023    4:05 PM 11/28/2022   12:01 PM 11/16/2021    1:38 PM  CMP  Glucose 70 - 99 mg/dL 91  95  96   BUN 8 - 23 mg/dL 15  12  18    Creatinine 0.44 - 1.00 mg/dL 9.21  9.15  9.09   Sodium 135 - 145 mmol/L 139  139  139   Potassium 3.5 - 5.1 mmol/L 4.1  5.1  5.2   Chloride 98 - 111 mmol/L 104  102  102   CO2 22 - 32 mmol/L 25  20  19   Calcium  8.9 - 10.3 mg/dL 89.5  89.4  89.2   Total Protein 6.5 - 8.1 g/dL 8.2  7.2  7.3   Total Bilirubin 0.0 - 1.2 mg/dL 0.4  0.3  0.4   Alkaline Phos 38 - 126 U/L 58  66  62   AST 15 - 41 U/L 36  26  18   ALT 0 - 44 U/L 17  15  16      No results found for: MG Lab Results  Component Value Date   CA2729 146.4 (H) 07/24/2023     Adverse Effects Assessment ANC: 1700 cells/uL. Hold if < 1000. Diarrhea: controlled with loperamide.   Adherence Assessment Penny Howard reports missing 0 doses over the past 2 weeks.   Reason for missed dose: N/A Patient was re-educated on importance of adherence.   Access Assessment Penny Howard is currently receiving her abemaciclib  through [facility]  Insurance concerns:  none  Medication Reconciliation The patient's medication list was reviewed today with the patient? Yes New medications or herbal supplements have recently been started? Penny Howard started ivermectin. She said it was shipped to her without a prescription and she was unsure of dose. We discussed that currently it is not  indicated for FDA approved for breast cancer. Patient verbalized understanding. Any medications have been discontinued? No  The medication list was updated and reconciled based on the patient's most recent medication list in the electronic medical record (EMR) including herbal products and OTC medications.   Medications Current Outpatient Medications  Medication Sig Dispense Refill   abemaciclib  (VERZENIO ) 100 MG tablet Take 1 tablet (100 mg total) by mouth 2 (two) times daily. 56 tablet 3   aspirin EC 81 MG tablet Take 81 mg by mouth daily.     cetirizine  (ZYRTEC ) 10 MG tablet Take 1 tablet by mouth once daily 100 tablet 3   escitalopram  (LEXAPRO ) 10 MG tablet Take 1 tablet (10 mg total) by mouth daily. 100 tablet 3   esomeprazole  (NEXIUM ) 20 MG capsule Take 1 capsule (20 mg total) by mouth daily. 100 capsule 3   ivermectin (STROMECTOL) 3 MG TABS tablet Take by mouth once.     letrozole  (FEMARA ) 2.5 MG tablet Take 1 tablet (2.5 mg total) by mouth daily. 90 tablet 3   lisinopril  (ZESTRIL ) 10 MG tablet Take 1 tablet (10 mg total) by mouth daily. 100 tablet 3   multivitamin-lutein (OCUVITE-LUTEIN) CAPS capsule Take 1 capsule by mouth daily. 250 mg capsule     rosuvastatin  (CRESTOR ) 10 MG tablet Take 1 tablet (10 mg total) by mouth daily. Change in therapy 100 tablet 3   traZODone  (DESYREL ) 50 MG tablet TAKE 1/2 TO 2 TABLETS AT   BEDTIME AS NEEDED FOR SLEEP 100 tablet 3   vitamin E 180 MG (400 UNITS) capsule Take 400 Units by mouth daily.     No current facility-administered medications for this visit.    Drug-Drug Interactions (DDIs) DDIs were evaluated? Yes Significant DDIs? No  The patient was instructed to speak with their health care provider and/or the oral chemotherapy pharmacist before starting any new drug, including prescription or over the counter, natural / herbal products, or vitamins.  Supportive Care Diarrhea: we reviewed that diarrhea is common with abemaciclib  and confirmed  that she does have loperamide (Imodium) at home.  We reviewed how to take this medication PRN. Neutropenia: we discussed the importance of having a thermometer and what the Centers for Disease Control  and Prevention (CDC) considers a fever which is 100.28F (38C) or higher.  Gave patient 24/7 triage line to call if any fevers or symptoms. ILD/Pneumonitis: we reviewed potential symptoms including cough, shortness, and fatigue.  VTE: reviewed signs of DVT such as leg swelling, redness, pain, or tenderness and signs of PE such as shortness of breath, rapid or irregular heartbeat, cough, chest pain, or lightheadedness. Reviewed to take the medication every 12 hours (with food sometimes can be easier on the stomach) and to take it at the same time every day. Hepatotoxicity Drug interactions with grapefruit products  Dosing Assessment Hepatic adjustments needed? No  Renal adjustments needed? No  Toxicity adjustments needed? No  The current dosing regimen is appropriate to continue at this time.  Follow-Up Plan Continue abemaciclib  100 mg by mouth every 12 hours Continue letrozole  2.5 mg by mouth daily Per Dr. Loretha, start bone strengthener once tooth extraction has been completed.  Recommendations are to wait 3 months prior to starting.  Extraction tentatively on September 04, 2023. Start at end of October 2025. Monitor ANC, loose stool. CMP not drawn today but will draw next time with CBC with diff. CA 27.29 and CA 15.3 will likely result tomorrow and go directly to Dr. Loretha and her nursing staff. Labs, Dr. Loretha visit, on September 13, 2023 Prescribing information recommend labs every 2 weeks for the first 2 months, monthly for 2 months, and then as clinically indicated. Penny Howard can follow up with clinical pharmacy as deemed necessary by Dr. Amber Iruku going forward   Penny Howard participated in the discussion, expressed understanding, and voiced agreement with the above plan. All  questions were answered to her satisfaction. The patient was advised to contact the clinic at [phone] with any questions or concerns prior to her return visit.   I spent 30 minutes assessing and educating the patient.  Taleyah Hillman A. Lucila, PharmD, BCOP, CPP  Norleen DELENA Lucila, RPH-CPP, 08/29/2023  11:31 AM   **Disclaimer: This note was dictated with voice recognition software. Similar sounding words can inadvertently be transcribed and this note may contain transcription errors which may not have been corrected upon publication of note.**

## 2023-08-29 ENCOUNTER — Other Ambulatory Visit: Payer: Self-pay | Admitting: Pharmacist

## 2023-08-29 ENCOUNTER — Inpatient Hospital Stay: Admitting: Pharmacist

## 2023-08-29 ENCOUNTER — Inpatient Hospital Stay: Attending: Hematology and Oncology

## 2023-08-29 VITALS — BP 139/64 | HR 61 | Temp 97.6°F | Resp 18 | Ht 59.0 in | Wt 121.6 lb

## 2023-08-29 DIAGNOSIS — D649 Anemia, unspecified: Secondary | ICD-10-CM | POA: Diagnosis not present

## 2023-08-29 DIAGNOSIS — Z1721 Progesterone receptor positive status: Secondary | ICD-10-CM | POA: Diagnosis not present

## 2023-08-29 DIAGNOSIS — C773 Secondary and unspecified malignant neoplasm of axilla and upper limb lymph nodes: Secondary | ICD-10-CM | POA: Diagnosis not present

## 2023-08-29 DIAGNOSIS — C7951 Secondary malignant neoplasm of bone: Secondary | ICD-10-CM | POA: Insufficient documentation

## 2023-08-29 DIAGNOSIS — C50411 Malignant neoplasm of upper-outer quadrant of right female breast: Secondary | ICD-10-CM | POA: Insufficient documentation

## 2023-08-29 DIAGNOSIS — C50911 Malignant neoplasm of unspecified site of right female breast: Secondary | ICD-10-CM

## 2023-08-29 DIAGNOSIS — Z9011 Acquired absence of right breast and nipple: Secondary | ICD-10-CM | POA: Insufficient documentation

## 2023-08-29 DIAGNOSIS — Z1732 Human epidermal growth factor receptor 2 negative status: Secondary | ICD-10-CM | POA: Diagnosis not present

## 2023-08-29 DIAGNOSIS — Z806 Family history of leukemia: Secondary | ICD-10-CM | POA: Diagnosis not present

## 2023-08-29 DIAGNOSIS — C50919 Malignant neoplasm of unspecified site of unspecified female breast: Secondary | ICD-10-CM

## 2023-08-29 DIAGNOSIS — Z807 Family history of other malignant neoplasms of lymphoid, hematopoietic and related tissues: Secondary | ICD-10-CM | POA: Insufficient documentation

## 2023-08-29 DIAGNOSIS — Z170 Estrogen receptor positive status [ER+]: Secondary | ICD-10-CM | POA: Diagnosis not present

## 2023-08-29 DIAGNOSIS — D72819 Decreased white blood cell count, unspecified: Secondary | ICD-10-CM | POA: Insufficient documentation

## 2023-08-29 DIAGNOSIS — Z803 Family history of malignant neoplasm of breast: Secondary | ICD-10-CM | POA: Insufficient documentation

## 2023-08-29 DIAGNOSIS — Z800 Family history of malignant neoplasm of digestive organs: Secondary | ICD-10-CM | POA: Insufficient documentation

## 2023-08-29 DIAGNOSIS — T451X5A Adverse effect of antineoplastic and immunosuppressive drugs, initial encounter: Secondary | ICD-10-CM | POA: Insufficient documentation

## 2023-08-29 LAB — CBC WITH DIFFERENTIAL/PLATELET
Abs Immature Granulocytes: 0.01 K/uL (ref 0.00–0.07)
Basophils Absolute: 0 K/uL (ref 0.0–0.1)
Basophils Relative: 1 %
Eosinophils Absolute: 0.1 K/uL (ref 0.0–0.5)
Eosinophils Relative: 3 %
HCT: 35.2 % — ABNORMAL LOW (ref 36.0–46.0)
Hemoglobin: 11.3 g/dL — ABNORMAL LOW (ref 12.0–15.0)
Immature Granulocytes: 0 %
Lymphocytes Relative: 36 %
Lymphs Abs: 1.2 K/uL (ref 0.7–4.0)
MCH: 30.8 pg (ref 26.0–34.0)
MCHC: 32.1 g/dL (ref 30.0–36.0)
MCV: 95.9 fL (ref 80.0–100.0)
Monocytes Absolute: 0.2 K/uL (ref 0.1–1.0)
Monocytes Relative: 7 %
Neutro Abs: 1.7 K/uL (ref 1.7–7.7)
Neutrophils Relative %: 53 %
Platelets: 220 K/uL (ref 150–400)
RBC: 3.67 MIL/uL — ABNORMAL LOW (ref 3.87–5.11)
RDW: 12.6 % (ref 11.5–15.5)
WBC: 3.2 K/uL — ABNORMAL LOW (ref 4.0–10.5)
nRBC: 0 % (ref 0.0–0.2)

## 2023-08-30 LAB — CANCER ANTIGEN 15-3: CA 15-3: 73.5 U/mL — ABNORMAL HIGH (ref 0.0–25.0)

## 2023-08-31 LAB — CANCER ANTIGEN 27.29: CA 27.29: 120.4 U/mL — ABNORMAL HIGH (ref 0.0–38.6)

## 2023-09-03 ENCOUNTER — Other Ambulatory Visit: Payer: Self-pay | Admitting: *Deleted

## 2023-09-04 ENCOUNTER — Telehealth: Payer: Self-pay

## 2023-09-04 NOTE — Telephone Encounter (Signed)
 Copied from CRM [id number]. Topic: Clinical - Order For Equipment >> Sep 04, 2023 11:37 AM Donna BRAVO wrote: Reason for CRM: Chesley with [employer] [phone] faxed form on 08/16/23 and again 09/04/23 form for Lumbar Support  Chesley would like a call back regarding this issue [phone]

## 2023-09-04 NOTE — Telephone Encounter (Signed)
 Informed representative that we do not authorize unless we are contacted by the patient first.

## 2023-09-13 ENCOUNTER — Inpatient Hospital Stay (HOSPITAL_BASED_OUTPATIENT_CLINIC_OR_DEPARTMENT_OTHER): Admitting: Hematology and Oncology

## 2023-09-13 ENCOUNTER — Inpatient Hospital Stay

## 2023-09-13 VITALS — BP 156/56 | HR 56 | Temp 97.2°F | Resp 21 | Wt 121.0 lb

## 2023-09-13 DIAGNOSIS — C50911 Malignant neoplasm of unspecified site of right female breast: Secondary | ICD-10-CM

## 2023-09-13 DIAGNOSIS — Z170 Estrogen receptor positive status [ER+]: Secondary | ICD-10-CM

## 2023-09-13 DIAGNOSIS — C773 Secondary and unspecified malignant neoplasm of axilla and upper limb lymph nodes: Secondary | ICD-10-CM | POA: Diagnosis not present

## 2023-09-13 DIAGNOSIS — C7951 Secondary malignant neoplasm of bone: Secondary | ICD-10-CM | POA: Diagnosis not present

## 2023-09-13 DIAGNOSIS — Z1721 Progesterone receptor positive status: Secondary | ICD-10-CM | POA: Diagnosis not present

## 2023-09-13 DIAGNOSIS — Z1732 Human epidermal growth factor receptor 2 negative status: Secondary | ICD-10-CM | POA: Diagnosis not present

## 2023-09-13 DIAGNOSIS — C50411 Malignant neoplasm of upper-outer quadrant of right female breast: Secondary | ICD-10-CM | POA: Diagnosis not present

## 2023-09-13 DIAGNOSIS — C50919 Malignant neoplasm of unspecified site of unspecified female breast: Secondary | ICD-10-CM

## 2023-09-13 LAB — CMP (CANCER CENTER ONLY)
ALT: 11 U/L (ref 0–44)
AST: 18 U/L (ref 15–41)
Albumin: 4.3 g/dL (ref 3.5–5.0)
Alkaline Phosphatase: 42 U/L (ref 38–126)
Anion gap: 7 (ref 5–15)
BUN: 14 mg/dL (ref 8–23)
CO2: 27 mmol/L (ref 22–32)
Calcium: 9.8 mg/dL (ref 8.9–10.3)
Chloride: 108 mmol/L (ref 98–111)
Creatinine: 0.94 mg/dL (ref 0.44–1.00)
GFR, Estimated: >60 mL/min (ref >60)
Glucose, Bld: 99 mg/dL (ref 70–99)
Potassium: 4.1 mmol/L (ref 3.5–5.1)
Sodium: 142 mmol/L (ref 135–145)
Total Bilirubin: 0.4 mg/dL (ref 0.0–1.2)
Total Protein: 7.1 g/dL (ref 6.5–8.1)

## 2023-09-13 LAB — CBC WITH DIFFERENTIAL/PLATELET
Abs Immature Granulocytes: 0.02 K/uL (ref 0.00–0.07)
Basophils Absolute: 0 K/uL (ref 0.0–0.1)
Basophils Relative: 2 %
Eosinophils Absolute: 0.1 K/uL (ref 0.0–0.5)
Eosinophils Relative: 3 %
HCT: 31.7 % — ABNORMAL LOW (ref 36.0–46.0)
Hemoglobin: 10.3 g/dL — ABNORMAL LOW (ref 12.0–15.0)
Immature Granulocytes: 1 %
Lymphocytes Relative: 34 %
Lymphs Abs: 0.9 K/uL (ref 0.7–4.0)
MCH: 31.5 pg (ref 26.0–34.0)
MCHC: 32.5 g/dL (ref 30.0–36.0)
MCV: 96.9 fL (ref 80.0–100.0)
Monocytes Absolute: 0.2 K/uL (ref 0.1–1.0)
Monocytes Relative: 9 %
Neutro Abs: 1.4 K/uL — ABNORMAL LOW (ref 1.7–7.7)
Neutrophils Relative %: 51 %
Platelets: 182 K/uL (ref 150–400)
RBC: 3.27 MIL/uL — ABNORMAL LOW (ref 3.87–5.11)
RDW: 13.3 % (ref 11.5–15.5)
WBC: 2.7 K/uL — ABNORMAL LOW (ref 4.0–10.5)
nRBC: 0 % (ref 0.0–0.2)

## 2023-09-13 NOTE — Progress Notes (Signed)
 [facility] CONSULT NOTE  Patient Care Team: Jolinda Norene HERO, DO as PCP - General (Family Medicine) Kristie Lamprey, MD as Consulting Physician (Gastroenterology) Sheldon Standing, MD as Consulting Physician (General Surgery) Shila Gustav GAILS, MD as Consulting Physician (Gastroenterology) Arelia Filippo, MD as Consulting Physician (Plastic Surgery) Ladora Ross Lacy Phebe, MD as Referring Physician (Optometry) Magda Debby SAILOR, MD as Consulting Physician (Vascular Surgery) Vanderbilt Debby, MD as Consulting Physician (General Surgery) Prajwal Fellner, MD as Consulting Physician (Hematology and Oncology)  CHIEF COMPLAINTS/PURPOSE OF CONSULTATION:  Newly diagnosed breast cancer  HISTORY OF PRESENTING ILLNESS:  Penny Howard 78 y.o. female is here because of recent diagnosis of right breast cancer  I reviewed her records extensively and collaborated the history with the patient.  SUMMARY OF ONCOLOGIC HISTORY: Oncology History  Malignant neoplasm of upper-outer quadrant of right breast in female, estrogen receptor positive (HCC)  06/05/2013 Initial Diagnosis   Malignant neoplasm of upper-outer quadrant of right breast in female, estrogen receptor positive (HCC)   06/11/2013 Cancer Staging   Staging form: Breast, AJCC 7th Edition - Clinical: Stage IIA (T2, N0, cM0) - Signed by Loretha Ash, MD on 07/24/2023 Laterality: Right Prognostic indicators: ER/PR +    HER 2 -   Staging comments: Staged at breast conference 06/11/13.    Recurrent breast cancer, right (HCC)  07/17/2023 Breast US    Diffuse heterogeneity with indistinct hypoechoic areas and shadowing throughout the mid to inferior aspect of the right axilla likely indicating an infiltrative process. No distinct focal mass visualized. No focal abnormality over the entire right mastectomy site.   07/17/2023 Pathology Results   Right breast needle core biopsy showed grade 2 invasive lobular cancer, ER +90% strong  staining PR 90% positive strong staining, HER2 negative by FISH and Ki-67 of 15%   07/24/2023 Initial Diagnosis   Recurrent breast cancer, right (HCC)   07/24/2023 Cancer Staging   Staging form: Breast, AJCC 8th Edition - Clinical: Stage Unknown (cTX, cNX, cM0, G2, ER+, PR+, HER2-) - Signed by Loretha Ash, MD on 07/24/2023 Histologic grading system: 3 grade system    History of Present Illness Penny Howard is a 78 year old female with newly diagnosed met breast cancer on verzenio  and AI  Discussed the use of AI scribe software for clinical note transcription with the patient, who gave verbal consent to proceed.  She is currently undergoing treatment with Verzenio , which she has been taking for about a month without any side effects. She takes it twice a day along with another medication once daily, both of which she tolerates well. She notes improvement in the area under her arm, which feels better.  She recently had a tooth extraction on July 15th, and the dentist was satisfied with the procedure. She is aware of the need to wait three months post-dental procedure before starting bisphosphonates due to the risk of osteonecrosis of the jaw.  No new health issues related to her cancer are reported. She denies any pain, including in the right second rib area, and has no new pain symptoms. She has a history of occasional coughing, which is not new and has not worsened. Her blood counts have shown a slightly lower white blood cell count and hemoglobin. Her tumor markers are being tracked.  She is looking forward to celebrating her birthday tomorrow with a massage and dinner with family, including seeing her great grandson who is almost four months old.   MEDICAL HISTORY:  Past Medical History:  Diagnosis  Date   Abnormal glucose tolerance test 05/02/2011   Allergy    Anxiety    Bruit of right carotid artery 07/18/2018   Cancer (HCC)    breast rt   Family history of breast cancer     Family history of leukemia    Family history of non-Hodgkin's lymphoma    Family history of pancreatic cancer    Family history of stomach cancer    GERD (gastroesophageal reflux disease)    High cholesterol    Hypertension    Liver fibrosis    Osteoporosis    Other malaise and fatigue 02/02/2010   Squamous cell carcinoma    Stress incontinence    Unspecified urinary incontinence 11/03/2004   Wears contact lenses     SURGICAL HISTORY: Past Surgical History:  Procedure Laterality Date   ABDOMINAL HYSTERECTOMY  1990   ovaries intact b/l   BLADDER SURGERY  10   mesh   BREAST BIOPSY Right 07/17/2023   US  RT BREAST BX W LOC DEV 1ST LESION IMG BX SPEC US  GUIDE 07/17/2023 [facility]AMMOGRAP[facility]   COLONOSCOPY     LIPOSUCTION WITH LIPOFILLING Right 04/24/2014   Procedure: LIPOFILLING TO RIGHT CHEST/RIGHT NIPPLE AREOLA COMPLEX CREATION WITH FULL THICKNESS SKIN GRAFT/REVISION OF RIGHT TRANSFLAP;  Surgeon: Earlis Ranks, MD;  Location: [facility];  Service: Plastics;  Laterality: Right;   MASTECTOMY Right 07/04/2013   MASTECTOMY W/ SENTINEL NODE BIOPSY  07/04/2013   RT TOTAL            DR MIKELL   SCAR REVISION Right 04/24/2014   Procedure: SCAR REVISION;  Surgeon: Earlis Ranks, MD;  Location: [facility];  Service: Plastics;  Laterality: Right;   SIMPLE MASTECTOMY WITH AXILLARY SENTINEL NODE BIOPSY Right 07/04/2013   Procedure: RIGHT TOTAL MASTECTOMY WITH RIGHT  AXILLARY SENTINEL LYMPH NODE BIOPSY;  Surgeon: Morene ONEIDA MIKELL, MD;  Location: [facility];  Service: General;  Laterality: Right;   SKIN FULL THICKNESS GRAFT Right 04/24/2014   Procedure: SKIN GRAFT FULL THICKNESS;  Surgeon: Earlis Ranks, MD;  Location: [facility];  Service: Plastics;  Laterality: Right;   SQUAMOUS CELL CARCINOMA EXCISION Right    Lower Leg   TRAM Right 12/30/2013   Procedure: TRANSVERSE RECTUS ABDOMINIS MYOCUTANEOUS (TRAM) FLAP FOR RIGHT BREAST RECONSTRUCTION;  Surgeon:  Earlis Ranks, MD;  Location: [facility];  Service: Plastics;  Laterality: Right;    SOCIAL HISTORY: Social History   Socioeconomic History   Marital status: Married    Spouse name: Not on file   Number of children: 2   Years of education: 13   Highest education level: Some college, no degree  Occupational History   Occupation: retired  Tobacco Use   Smoking status: Never   Smokeless tobacco: Never  Vaping Use   Vaping status: Never Used  Substance and Sexual Activity   Alcohol use: Yes    Alcohol/week: 5.0 standard drinks of alcohol    Types: 5 Glasses of wine per week    Comment: occaionally   Drug use: No   Sexual activity: Yes  Other Topics Concern   Not on file  Social History Narrative   Not on file   Social Drivers of Health   Financial Resource Strain: Low Risk  (03/29/2023)   Overall Financial Resource Strain (CARDIA)    Difficulty of Paying Living Expenses: Not hard at all  Food Insecurity: No Food Insecurity (03/29/2023)   Hunger Vital Sign    Worried About Running Out  of Food in the Last Year: Never true    Ran Out of Food in the Last Year: Never true  Transportation Needs: No Transportation Needs (03/29/2023)   PRAPARE - Administrator, Civil Service (Medical): No    Lack of Transportation (Non-Medical): No  Physical Activity: Sufficiently Active (03/29/2023)   Exercise Vital Sign    Days of Exercise per Week: 5 days    Minutes of Exercise per Session: 30 min  Stress: No Stress Concern Present (03/29/2023)   [employer] of Occupational Health - Occupational Stress Questionnaire    Feeling of Stress : Not at all  Social Connections: Moderately Integrated (03/29/2023)   Social Connection and Isolation Panel    Frequency of Communication with Friends and Family: More than three times a week    Frequency of Social Gatherings with Friends and Family: Three times a week    Attends Religious Services: 1 to 4 times per year    Active Member of Clubs or  Organizations: No    Attends Banker Meetings: Never    Marital Status: Married  Catering manager Violence: Not At Risk (03/29/2023)   Humiliation, Afraid, Rape, and Kick questionnaire    Fear of Current or Ex-Partner: No    Emotionally Abused: No    Physically Abused: No    Sexually Abused: No    FAMILY HISTORY: Family History  Problem Relation Age of Onset   Breast cancer Mother 50   Heart failure Father    Breast cancer Paternal Aunt 8   Lymphoma Cousin 35       non- hodgkins lymphoma   Stomach cancer Maternal Uncle 18   Pancreatic cancer Other    Leukemia Other    Colon cancer Neg Hx    Rectal cancer Neg Hx    Esophageal cancer Neg Hx    Liver cancer Neg Hx     ALLERGIES:  is allergic to codeine.  MEDICATIONS:  Current Outpatient Medications  Medication Sig Dispense Refill   abemaciclib  (VERZENIO ) 100 MG tablet Take 1 tablet (100 mg total) by mouth 2 (two) times daily. 56 tablet 3   aspirin EC 81 MG tablet Take 81 mg by mouth daily.     cetirizine  (ZYRTEC ) 10 MG tablet Take 1 tablet by mouth once daily 100 tablet 3   escitalopram  (LEXAPRO ) 10 MG tablet Take 1 tablet (10 mg total) by mouth daily. 100 tablet 3   esomeprazole  (NEXIUM ) 20 MG capsule Take 1 capsule (20 mg total) by mouth daily. 100 capsule 3   ivermectin (STROMECTOL) 3 MG TABS tablet Take 12 mg by mouth once.     letrozole  (FEMARA ) 2.5 MG tablet Take 1 tablet (2.5 mg total) by mouth daily. 90 tablet 3   lisinopril  (ZESTRIL ) 10 MG tablet Take 1 tablet (10 mg total) by mouth daily. 100 tablet 3   multivitamin-lutein (OCUVITE-LUTEIN) CAPS capsule Take 1 capsule by mouth daily. 250 mg capsule     rosuvastatin  (CRESTOR ) 10 MG tablet Take 1 tablet (10 mg total) by mouth daily. Change in therapy 100 tablet 3   traZODone  (DESYREL ) 50 MG tablet TAKE 1/2 TO 2 TABLETS AT   BEDTIME AS NEEDED FOR SLEEP 100 tablet 3   vitamin E 180 MG (400 UNITS) capsule Take 400 Units by mouth daily.     No current  facility-administered medications for this visit.     PHYSICAL EXAMINATION: ECOG PERFORMANCE STATUS: 0 - Asymptomatic  Vitals:   09/13/23 0844  BP: ROLLEN)  156/56  Pulse: (!) 56  Resp: (!) 21  Temp: (!) 97.2 F (36.2 C)  SpO2: 100%    Filed Weights   09/13/23 0844  Weight: 121 lb (54.9 kg)    PE deferred in lieu of counseling  LABORATORY DATA:  I have reviewed the data as listed Lab Results  Component Value Date   WBC 2.7 (L) 09/13/2023   HGB 10.3 (L) 09/13/2023   HCT 31.7 (L) 09/13/2023   MCV 96.9 09/13/2023   PLT 182 09/13/2023   Lab Results  Component Value Date   NA 139 07/24/2023   K 4.1 07/24/2023   CL 104 07/24/2023   CO2 25 07/24/2023    RADIOGRAPHIC STUDIES: I have personally reviewed the radiological reports and agreed with the findings in the report.  ASSESSMENT AND PLAN:   Assessment and Plan Assessment & Plan Metastatic Invasive lobular carcinoma, ER positive, PR positive, Her 2 neg. Metastatic breast cancer with local invasion and distant metastases, including anterior mediastinal mass with rib destruction, axillary lymph node involvement, and bone lesions. Estrogen and progesterone receptor-positive, HER2-negative. Letrozole  and abemaciclib  chosen based on hormone receptor status.   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 Lesion on right second rib noted, not currently painful. Verzenio  expected to work systemically for two to three years. - Continue Verzenio  as prescribed. - Plan follow-up scan in September to assess lesion size. - Consider radiation therapy if rib lesion becomes painful. - Start bone strengthener in October, pending dental clearance.  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  General Health Maintenance Encouraged exercises to enhance lung capacity.  Follow-up Follow-up plan discussed to monitor treatment efficacy. - Schedule follow-up appointment in one month.  Time spent: 30 min   All questions were answered. The patient knows to call the clinic with any problems, questions or concerns.    Amber Stalls, MD 09/13/23

## 2023-09-14 LAB — CANCER ANTIGEN 15-3: CA 15-3: 53.9 U/mL — ABNORMAL HIGH (ref 0.0–25.0)

## 2023-09-14 LAB — CANCER ANTIGEN 27.29: CA 27.29: 105.2 U/mL — ABNORMAL HIGH (ref 0.0–38.6)

## 2023-09-19 ENCOUNTER — Other Ambulatory Visit: Payer: Self-pay | Admitting: Family Medicine

## 2023-09-19 DIAGNOSIS — Z1231 Encounter for screening mammogram for malignant neoplasm of breast: Secondary | ICD-10-CM

## 2023-09-26 ENCOUNTER — Encounter: Payer: Self-pay | Admitting: Hematology and Oncology

## 2023-10-11 ENCOUNTER — Inpatient Hospital Stay: Admitting: Hematology and Oncology

## 2023-10-11 ENCOUNTER — Inpatient Hospital Stay: Attending: Hematology and Oncology

## 2023-10-11 VITALS — BP 143/61 | HR 65 | Temp 97.8°F | Resp 12 | Wt 117.1 lb

## 2023-10-11 DIAGNOSIS — C50919 Malignant neoplasm of unspecified site of unspecified female breast: Secondary | ICD-10-CM

## 2023-10-11 DIAGNOSIS — Z79811 Long term (current) use of aromatase inhibitors: Secondary | ICD-10-CM | POA: Diagnosis not present

## 2023-10-11 DIAGNOSIS — Z806 Family history of leukemia: Secondary | ICD-10-CM | POA: Diagnosis not present

## 2023-10-11 DIAGNOSIS — R748 Abnormal levels of other serum enzymes: Secondary | ICD-10-CM | POA: Insufficient documentation

## 2023-10-11 DIAGNOSIS — C50911 Malignant neoplasm of unspecified site of right female breast: Secondary | ICD-10-CM

## 2023-10-11 DIAGNOSIS — T451X5A Adverse effect of antineoplastic and immunosuppressive drugs, initial encounter: Secondary | ICD-10-CM | POA: Diagnosis not present

## 2023-10-11 DIAGNOSIS — R7401 Elevation of levels of liver transaminase levels: Secondary | ICD-10-CM | POA: Diagnosis not present

## 2023-10-11 DIAGNOSIS — R197 Diarrhea, unspecified: Secondary | ICD-10-CM | POA: Diagnosis not present

## 2023-10-11 DIAGNOSIS — Z1732 Human epidermal growth factor receptor 2 negative status: Secondary | ICD-10-CM | POA: Diagnosis not present

## 2023-10-11 DIAGNOSIS — Z90710 Acquired absence of both cervix and uterus: Secondary | ICD-10-CM | POA: Insufficient documentation

## 2023-10-11 DIAGNOSIS — Z803 Family history of malignant neoplasm of breast: Secondary | ICD-10-CM | POA: Insufficient documentation

## 2023-10-11 DIAGNOSIS — Z170 Estrogen receptor positive status [ER+]: Secondary | ICD-10-CM | POA: Insufficient documentation

## 2023-10-11 DIAGNOSIS — D6481 Anemia due to antineoplastic chemotherapy: Secondary | ICD-10-CM | POA: Diagnosis not present

## 2023-10-11 DIAGNOSIS — Z800 Family history of malignant neoplasm of digestive organs: Secondary | ICD-10-CM | POA: Diagnosis not present

## 2023-10-11 DIAGNOSIS — Z9011 Acquired absence of right breast and nipple: Secondary | ICD-10-CM | POA: Diagnosis not present

## 2023-10-11 DIAGNOSIS — Z807 Family history of other malignant neoplasms of lymphoid, hematopoietic and related tissues: Secondary | ICD-10-CM | POA: Diagnosis not present

## 2023-10-11 DIAGNOSIS — C50411 Malignant neoplasm of upper-outer quadrant of right female breast: Secondary | ICD-10-CM | POA: Insufficient documentation

## 2023-10-11 DIAGNOSIS — Z1721 Progesterone receptor positive status: Secondary | ICD-10-CM | POA: Insufficient documentation

## 2023-10-11 LAB — CBC WITH DIFFERENTIAL/PLATELET
Abs Immature Granulocytes: 0.01 K/uL (ref 0.00–0.07)
Basophils Absolute: 0.1 K/uL (ref 0.0–0.1)
Basophils Relative: 2 %
Eosinophils Absolute: 0.1 K/uL (ref 0.0–0.5)
Eosinophils Relative: 2 %
HCT: 31.6 % — ABNORMAL LOW (ref 36.0–46.0)
Hemoglobin: 10.7 g/dL — ABNORMAL LOW (ref 12.0–15.0)
Immature Granulocytes: 0 %
Lymphocytes Relative: 35 %
Lymphs Abs: 1.2 K/uL (ref 0.7–4.0)
MCH: 32.8 pg (ref 26.0–34.0)
MCHC: 33.9 g/dL (ref 30.0–36.0)
MCV: 96.9 fL (ref 80.0–100.0)
Monocytes Absolute: 0.4 K/uL (ref 0.1–1.0)
Monocytes Relative: 11 %
Neutro Abs: 1.7 K/uL (ref 1.7–7.7)
Neutrophils Relative %: 50 %
Platelets: 269 K/uL (ref 150–400)
RBC: 3.26 MIL/uL — ABNORMAL LOW (ref 3.87–5.11)
RDW: 14.9 % (ref 11.5–15.5)
WBC: 3.3 K/uL — ABNORMAL LOW (ref 4.0–10.5)
nRBC: 0 % (ref 0.0–0.2)

## 2023-10-11 LAB — CMP (CANCER CENTER ONLY)
ALT: 50 U/L — ABNORMAL HIGH (ref 0–44)
AST: 45 U/L — ABNORMAL HIGH (ref 15–41)
Albumin: 4.7 g/dL (ref 3.5–5.0)
Alkaline Phosphatase: 58 U/L (ref 38–126)
Anion gap: 8 (ref 5–15)
BUN: 18 mg/dL (ref 8–23)
CO2: 25 mmol/L (ref 22–32)
Calcium: 10.2 mg/dL (ref 8.9–10.3)
Chloride: 103 mmol/L (ref 98–111)
Creatinine: 0.81 mg/dL (ref 0.44–1.00)
GFR, Estimated: >60 mL/min (ref >60)
Glucose, Bld: 98 mg/dL (ref 70–99)
Potassium: 4.1 mmol/L (ref 3.5–5.1)
Sodium: 136 mmol/L (ref 135–145)
Total Bilirubin: 0.4 mg/dL (ref 0.0–1.2)
Total Protein: 7.8 g/dL (ref 6.5–8.1)

## 2023-10-11 NOTE — Progress Notes (Signed)
 [facility] CONSULT NOTE  Patient Care Team: Jolinda Norene HERO, DO as PCP - General (Family Medicine) Kristie Lamprey, MD as Consulting Physician (Gastroenterology) Sheldon Standing, MD as Consulting Physician (General Surgery) Shila Gustav GAILS, MD as Consulting Physician (Gastroenterology) Arelia Filippo, MD as Consulting Physician (Plastic Surgery) Ladora Ross Lacy Phebe, MD as Referring Physician (Optometry) Magda Debby SAILOR, MD as Consulting Physician (Vascular Surgery) Vanderbilt Debby, MD as Consulting Physician (General Surgery) Winifred Balogh, MD as Consulting Physician (Hematology and Oncology)  CHIEF COMPLAINTS/PURPOSE OF CONSULTATION:  Newly diagnosed breast cancer  HISTORY OF PRESENTING ILLNESS:  Penny Howard 78 y.o. female is here because of recent diagnosis of right breast cancer  I reviewed her records extensively and collaborated the history with the patient.  SUMMARY OF ONCOLOGIC HISTORY: Oncology History  Malignant neoplasm of upper-outer quadrant of right breast in female, estrogen receptor positive (HCC)  06/05/2013 Initial Diagnosis   Malignant neoplasm of upper-outer quadrant of right breast in female, estrogen receptor positive (HCC)   06/11/2013 Cancer Staging   Staging form: Breast, AJCC 7th Edition - Clinical: Stage IIA (T2, N0, cM0) - Signed by Loretha Ash, MD on 07/24/2023 Laterality: Right Prognostic indicators: ER/PR +    HER 2 -   Staging comments: Staged at breast conference 06/11/13.    Recurrent breast cancer, right (HCC)  07/17/2023 Breast US    Diffuse heterogeneity with indistinct hypoechoic areas and shadowing throughout the mid to inferior aspect of the right axilla likely indicating an infiltrative process. No distinct focal mass visualized. No focal abnormality over the entire right mastectomy site.   07/17/2023 Pathology Results   Right breast needle core biopsy showed grade 2 invasive lobular cancer, ER +90% strong  staining PR 90% positive strong staining, HER2 negative by FISH and Ki-67 of 15%   07/24/2023 Initial Diagnosis   Recurrent breast cancer, right (HCC)   07/24/2023 Cancer Staging   Staging form: Breast, AJCC 8th Edition - Clinical: Stage IV (cTX, cNX, cM1, G2, ER+, PR+, HER2-) - Signed by Loretha Ash, MD on 09/13/2023 Histologic grading system: 3 grade system    History of Present Illness  Penny Howard is a 78 year old female with breast cancer who presents for follow-up regarding her treatment with Verzenio  and letrozole . She is accompanied by her daughter and husband.  She started taking Verzenio  in late June 2025 and experiences some diarrhea as a side effect, though it is not severe and does not occur daily. She feels that the affected area in the right chest wall and axilla are softer, particularly in the arm. No pain, and her breathing and sleep are satisfactory.  She had a tooth extraction on September 04, 2023, and is awaiting the three-month healing period before starting a bone strengthener.  She recently returned from a family vacation to [facility] and has a family reunion planned for the end of October.  She had a dental extraction 09/04/23, so anticipate starting zometa in mid October.   MEDICAL HISTORY:  Past Medical History:  Diagnosis Date   Abnormal glucose tolerance test 05/02/2011   Allergy    Anxiety    Bruit of right carotid artery 07/18/2018   Cancer (HCC)    breast rt   Family history of breast cancer    Family history of leukemia    Family history of non-Hodgkin's lymphoma    Family history of pancreatic cancer    Family history of stomach cancer    GERD (gastroesophageal reflux disease)  High cholesterol    Hypertension    Liver fibrosis    Osteoporosis    Other malaise and fatigue 02/02/2010   Squamous cell carcinoma    Stress incontinence    Unspecified urinary incontinence 11/03/2004   Wears contact lenses     SURGICAL HISTORY: Past  Surgical History:  Procedure Laterality Date   ABDOMINAL HYSTERECTOMY  1990   ovaries intact b/l   BLADDER SURGERY  10   mesh   BREAST BIOPSY Right 07/17/2023   US  RT BREAST BX W LOC DEV 1ST LESION IMG BX SPEC US  GUIDE 07/17/2023 [facility]AMMOGRAPHY   COLONOSCOPY     LIPOSUCTION WITH LIPOFILLING Right 04/24/2014   Procedure: LIPOFILLING TO RIGHT CHEST/RIGHT NIPPLE AREOLA COMPLEX CREATION WITH FULL THICKNESS SKIN GRAFT/REVISION OF RIGHT TRANSFLAP;  Surgeon: Earlis Ranks, MD;  Location: [facility];  Service: Plastics;  Laterality: Right;   MASTECTOMY Right 07/04/2013   MASTECTOMY W/ SENTINEL NODE BIOPSY  07/04/2013   RT TOTAL            DR MIKELL   SCAR REVISION Right 04/24/2014   Procedure: SCAR REVISION;  Surgeon: Earlis Ranks, MD;  Location: [facility];  Service: Plastics;  Laterality: Right;   SIMPLE MASTECTOMY WITH AXILLARY SENTINEL NODE BIOPSY Right 07/04/2013   Procedure: RIGHT TOTAL MASTECTOMY WITH RIGHT  AXILLARY SENTINEL LYMPH NODE BIOPSY;  Surgeon: Morene ONEIDA MIKELL, MD;  Location: [facility];  Service: General;  Laterality: Right;   SKIN FULL THICKNESS GRAFT Right 04/24/2014   Procedure: SKIN GRAFT FULL THICKNESS;  Surgeon: Earlis Ranks, MD;  Location: [facility];  Service: Plastics;  Laterality: Right;   SQUAMOUS CELL CARCINOMA EXCISION Right    Lower Leg   TRAM Right 12/30/2013   Procedure: TRANSVERSE RECTUS ABDOMINIS MYOCUTANEOUS (TRAM) FLAP FOR RIGHT BREAST RECONSTRUCTION;  Surgeon: Earlis Ranks, MD;  Location: [facility];  Service: Plastics;  Laterality: Right;    SOCIAL HISTORY: Social History   Socioeconomic History   Marital status: Married    Spouse name: Not on file   Number of children: 2   Years of education: 13   Highest education level: Some college, no degree  Occupational History   Occupation: retired  Tobacco Use   Smoking status: Never   Smokeless tobacco: Never  Vaping Use   Vaping status: Never Used   Substance and Sexual Activity   Alcohol use: Yes    Alcohol/week: 5.0 standard drinks of alcohol    Types: 5 Glasses of wine per week    Comment: occaionally   Drug use: No   Sexual activity: Yes  Other Topics Concern   Not on file  Social History Narrative   Not on file   Social Drivers of Health   Financial Resource Strain: Low Risk  (03/29/2023)   Overall Financial Resource Strain (CARDIA)    Difficulty of Paying Living Expenses: Not hard at all  Food Insecurity: No Food Insecurity (03/29/2023)   Hunger Vital Sign    Worried About Running Out of Food in the Last Year: Never true    Ran Out of Food in the Last Year: Never true  Transportation Needs: No Transportation Needs (03/29/2023)   PRAPARE - Administrator, Civil Service (Medical): No    Lack of Transportation (Non-Medical): No  Physical Activity: Sufficiently Active (03/29/2023)   Exercise Vital Sign    Days of Exercise per Week: 5 days    Minutes of Exercise per Session: 30 min  Stress: No Stress Concern Present (03/29/2023)   [employer] of Occupational Health - Occupational Stress Questionnaire    Feeling of Stress : Not at all  Social Connections: Moderately Integrated (03/29/2023)   Social Connection and Isolation Panel    Frequency of Communication with Friends and Family: More than three times a week    Frequency of Social Gatherings with Friends and Family: Three times a week    Attends Religious Services: 1 to 4 times per year    Active Member of Clubs or Organizations: No    Attends Banker Meetings: Never    Marital Status: Married  Catering manager Violence: Not At Risk (03/29/2023)   Humiliation, Afraid, Rape, and Kick questionnaire    Fear of Current or Ex-Partner: No    Emotionally Abused: No    Physically Abused: No    Sexually Abused: No    FAMILY HISTORY: Family History  Problem Relation Age of Onset   Breast cancer Mother 75   Heart failure Father    Breast cancer  Paternal Aunt 55   Lymphoma Cousin 35       non- hodgkins lymphoma   Stomach cancer Maternal Uncle 18   Pancreatic cancer Other    Leukemia Other    Colon cancer Neg Hx    Rectal cancer Neg Hx    Esophageal cancer Neg Hx    Liver cancer Neg Hx     ALLERGIES:  is allergic to codeine.  MEDICATIONS:  Current Outpatient Medications  Medication Sig Dispense Refill   abemaciclib  (VERZENIO ) 100 MG tablet Take 1 tablet (100 mg total) by mouth 2 (two) times daily. 56 tablet 3   aspirin EC 81 MG tablet Take 81 mg by mouth daily.     cetirizine  (ZYRTEC ) 10 MG tablet Take 1 tablet by mouth once daily 100 tablet 3   escitalopram  (LEXAPRO ) 10 MG tablet Take 1 tablet (10 mg total) by mouth daily. 100 tablet 3   esomeprazole  (NEXIUM ) 20 MG capsule Take 1 capsule (20 mg total) by mouth daily. 100 capsule 3   ivermectin (STROMECTOL) 3 MG TABS tablet Take 12 mg by mouth once.     letrozole  (FEMARA ) 2.5 MG tablet Take 1 tablet (2.5 mg total) by mouth daily. 90 tablet 3   lisinopril  (ZESTRIL ) 10 MG tablet Take 1 tablet (10 mg total) by mouth daily. 100 tablet 3   multivitamin-lutein (OCUVITE-LUTEIN) CAPS capsule Take 1 capsule by mouth daily. 250 mg capsule     rosuvastatin  (CRESTOR ) 10 MG tablet Take 1 tablet (10 mg total) by mouth daily. Change in therapy 100 tablet 3   traZODone  (DESYREL ) 50 MG tablet TAKE 1/2 TO 2 TABLETS AT   BEDTIME AS NEEDED FOR SLEEP 100 tablet 3   vitamin E 180 MG (400 UNITS) capsule Take 400 Units by mouth daily.     No current facility-administered medications for this visit.     PHYSICAL EXAMINATION: ECOG PERFORMANCE STATUS: 0 - Asymptomatic  Vitals:   10/11/23 1037  BP: (!) 143/61  Pulse: 65  Resp: 12  Temp: 97.8 F (36.6 C)  SpO2: 100%    Filed Weights   10/11/23 1037  Weight: 117 lb 1.6 oz (53.1 kg)    She appears well. Right axillary mass softer on palpation. Chest: CTA bilaterally RRR No LE edema.  LABORATORY DATA:  I have reviewed the data as  listed Lab Results  Component Value Date   WBC 3.3 (L) 10/11/2023   HGB 10.7 (L)  10/11/2023   HCT 31.6 (L) 10/11/2023   MCV 96.9 10/11/2023   PLT 269 10/11/2023   Lab Results  Component Value Date   NA 136 10/11/2023   K 4.1 10/11/2023   CL 103 10/11/2023   CO2 25 10/11/2023    RADIOGRAPHIC STUDIES: I have personally reviewed the radiological reports and agreed with the findings in the report.  ASSESSMENT AND PLAN:   Assessment and Plan Assessment & Plan Metastatic Invasive lobular carcinoma, ER positive, PR positive, Her 2 neg on abemaciclib  and letrozole . - Order imaging in September to assess disease status. - Continue Verzenio  and letrozole  therapy. - Monitor clinical and laboratory indicators every 3-4 months.  Diarrhea secondary to antineoplastic therapy Intermittent, non-severe diarrhea associated with Verzenio .  Elevated liver enzymes Mild elevation in liver enzymes with unclear etiology, not requiring immediate intervention. - Monitor liver enzyme levels.  Time spent: 30 min   All questions were answered. The patient knows to call the clinic with any problems, questions or concerns.    Amber Stalls, MD 10/11/23

## 2023-10-12 LAB — CANCER ANTIGEN 27.29: CA 27.29: 88.6 U/mL — ABNORMAL HIGH (ref 0.0–38.6)

## 2023-10-13 LAB — CANCER ANTIGEN 15-3: CA 15-3: 53.8 U/mL — ABNORMAL HIGH (ref 0.0–25.0)

## 2023-10-16 ENCOUNTER — Other Ambulatory Visit: Payer: Self-pay | Admitting: Family Medicine

## 2023-10-16 DIAGNOSIS — I10 Essential (primary) hypertension: Secondary | ICD-10-CM

## 2023-10-16 DIAGNOSIS — E782 Mixed hyperlipidemia: Secondary | ICD-10-CM

## 2023-10-16 DIAGNOSIS — I6521 Occlusion and stenosis of right carotid artery: Secondary | ICD-10-CM

## 2023-10-16 DIAGNOSIS — F411 Generalized anxiety disorder: Secondary | ICD-10-CM

## 2023-10-16 DIAGNOSIS — K2100 Gastro-esophageal reflux disease with esophagitis, without bleeding: Secondary | ICD-10-CM

## 2023-10-17 ENCOUNTER — Other Ambulatory Visit (HOSPITAL_COMMUNITY): Payer: Self-pay

## 2023-10-17 ENCOUNTER — Telehealth: Payer: Self-pay

## 2023-10-17 NOTE — Telephone Encounter (Signed)
 Pharmacy Patient Advocate Encounter   Received notification from CoverMyMeds that prior authorization for Esomeprazole  Magnesium  20MG  dr capsules   is required/requested.   Insurance verification completed.   The patient is insured through [employer] [facility] .   Per test claim: PA required; PA submitted to above mentioned insurance via Latent Key/confirmation #/EOC [facility] Status is pending         Engineer, manufacturing systems completed.   The patient is insured through [employer] [facility]  RAN test claim for Esomeprazole  Magnesium  20MG  dr capsules. FOR  MAX QTY OF 90.000 IN 365 DAYS QUANTITY REMAINING .000 NEXT FILL DT [id number], LAST FILL DT [id number] NON-SPECIALTY DRUG  PLEASE BE ADVISED THAT PA IS 90 PER 365 DAY

## 2023-10-18 ENCOUNTER — Ambulatory Visit (INDEPENDENT_AMBULATORY_CARE_PROVIDER_SITE_OTHER): Admitting: Family Medicine

## 2023-10-18 ENCOUNTER — Encounter: Payer: Self-pay | Admitting: Family Medicine

## 2023-10-18 VITALS — BP 127/74 | HR 82 | Temp 97.8°F | Ht 59.0 in | Wt 117.0 lb

## 2023-10-18 DIAGNOSIS — C50911 Malignant neoplasm of unspecified site of right female breast: Secondary | ICD-10-CM

## 2023-10-18 DIAGNOSIS — N3000 Acute cystitis without hematuria: Secondary | ICD-10-CM | POA: Diagnosis not present

## 2023-10-18 DIAGNOSIS — R350 Frequency of micturition: Secondary | ICD-10-CM | POA: Diagnosis not present

## 2023-10-18 LAB — URINALYSIS, ROUTINE W REFLEX MICROSCOPIC
Bilirubin, UA: NEGATIVE
Glucose, UA: NEGATIVE
Ketones, UA: NEGATIVE
Nitrite, UA: POSITIVE — AB
Specific Gravity, UA: 1.02 (ref 1.005–1.030)
Urobilinogen, Ur: 0.2 mg/dL (ref 0.2–1.0)
pH, UA: 5.5 (ref 5.0–7.5)

## 2023-10-18 LAB — MICROSCOPIC EXAMINATION
Epithelial Cells (non renal): NONE SEEN /HPF (ref 0–10)
RBC, Urine: NONE SEEN /HPF (ref 0–2)
Renal Epithel, UA: NONE SEEN /HPF
WBC, UA: >30 /HPF — AB (ref 0–5)
Yeast, UA: NONE SEEN

## 2023-10-18 MED ORDER — CEPHALEXIN 500 MG PO CAPS: 500.0000 mg | ORAL_CAPSULE | Freq: Two times a day (BID) | ORAL | 0 refills | Status: AC

## 2023-10-18 NOTE — Telephone Encounter (Signed)
 Pharmacy Patient Advocate Encounter  Received notification from [employer] [facility] that Prior Authorization for Esomeprazole  Magnesium  20MG  dr capsules has been APPROVED from 09/18/2023 to 10/17/2024   PA #/Case ID/Reference #: [id number]

## 2023-10-18 NOTE — Progress Notes (Signed)
 Acute Office Visit  Subjective:     Patient ID: Penny Howard, female    DOB: 1946/01/03, 78 y.o.   MRN: [id number]  Chief Complaint  Patient presents with   Urinary Frequency    Urinary Frequency  Associated symptoms include frequency.    History of Present Illness   Penny Howard is a 78 year old female with metastatic breast cancer who presents with burning urination and frequency.  Dysuria and urinary frequency - Burning sensation with urination for approximately one week - Increased urinary frequency for approximately one week - Worsening of urinary symptoms over the past week - Associated lower abdominal pressure and urinary urgency - No back pain, fever, chills, nausea, or vomiting - History of recurrent urinary tract infections, but no recent episodes  Oncologic history - Metastatic breast cancer - Currently receiving letrozole  and verezenio therapy       Review of Systems  Genitourinary:  Positive for frequency.   As per HPI.      Objective:    BP 127/74   Pulse 82   Temp 97.8 F (36.6 C) (Temporal)   Ht 4' 11 (1.499 m)   Wt 117 lb (53.1 kg)   SpO2 97%   BMI 23.63 kg/m    Physical Exam Vitals and nursing note reviewed.  Constitutional:      General: She is not in acute distress.    Appearance: She is not ill-appearing, toxic-appearing or diaphoretic.  Cardiovascular:     Rate and Rhythm: Normal rate and regular rhythm.     Heart sounds: Normal heart sounds. No murmur heard. Pulmonary:     Effort: Pulmonary effort is normal.  Abdominal:     Tenderness: There is abdominal tenderness in the suprapubic area. There is no right CVA tenderness, left CVA tenderness or guarding.  Musculoskeletal:     Right lower leg: No edema.     Left lower leg: No edema.  Skin:    General: Skin is warm and dry.  Neurological:     General: No focal deficit present.     Mental Status: She is alert and oriented to person, place, and time.  Psychiatric:         Mood and Affect: Mood normal.        Behavior: Behavior normal.     Urine dipstick shows positive for RBC's, positive for protein, positive for nitrates, and positive for leukocytes.  Micro exam: >30 WBC's per HPF, 0 RBC's per HPF, and many+ bacteria.     Assessment & Plan:   Aairah was seen today for urinary frequency.  Diagnoses and all orders for this visit:  Acute cystitis without hematuria -     Urinalysis, Routine w reflex microscopic -     cephALEXin  (KEFLEX ) 500 MG capsule; Take 1 capsule (500 mg total) by mouth 2 (two) times daily for 7 days. -     Urine Culture  Recurrent breast cancer, right (HCC)       Urinary tract infection Acute UTI with dysuria, frequency, urgency, and lower abdominal pressure. Possible link to letrozole  use. - Prescribed Keflex  (cephalexin ) twice daily for one week. - Sent urine for culture to confirm infection and ensure appropriate antibiotic coverage. - Advised to call if symptoms do not improve after starting antibiotic  Metastatic breast cancer to bone Breast cancer in remission for ten years, now metastasized to bone. On letrozole  and verzenio  treatment. No drug interactions with prescribed antibiotics.      Return to  office for new or worsening symptoms, or if symptoms persist.   The patient indicates understanding of these issues and agrees with the plan.  Annabella CHRISTELLA Search, FNP

## 2023-10-22 LAB — URINE CULTURE

## 2023-10-24 ENCOUNTER — Ambulatory Visit: Payer: Self-pay | Admitting: Family Medicine

## 2023-11-01 ENCOUNTER — Other Ambulatory Visit: Payer: Self-pay | Admitting: Hematology and Oncology

## 2023-11-08 ENCOUNTER — Inpatient Hospital Stay: Attending: Hematology and Oncology

## 2023-11-08 ENCOUNTER — Inpatient Hospital Stay (HOSPITAL_BASED_OUTPATIENT_CLINIC_OR_DEPARTMENT_OTHER): Admitting: Hematology and Oncology

## 2023-11-08 VITALS — BP 139/64 | HR 62 | Temp 98.3°F | Resp 15 | Wt 113.5 lb

## 2023-11-08 DIAGNOSIS — D6481 Anemia due to antineoplastic chemotherapy: Secondary | ICD-10-CM | POA: Diagnosis not present

## 2023-11-08 DIAGNOSIS — Z1721 Progesterone receptor positive status: Secondary | ICD-10-CM | POA: Diagnosis not present

## 2023-11-08 DIAGNOSIS — Z9011 Acquired absence of right breast and nipple: Secondary | ICD-10-CM | POA: Insufficient documentation

## 2023-11-08 DIAGNOSIS — C50911 Malignant neoplasm of unspecified site of right female breast: Secondary | ICD-10-CM | POA: Insufficient documentation

## 2023-11-08 DIAGNOSIS — Z170 Estrogen receptor positive status [ER+]: Secondary | ICD-10-CM | POA: Diagnosis not present

## 2023-11-08 DIAGNOSIS — T451X5A Adverse effect of antineoplastic and immunosuppressive drugs, initial encounter: Secondary | ICD-10-CM

## 2023-11-08 DIAGNOSIS — Z90710 Acquired absence of both cervix and uterus: Secondary | ICD-10-CM | POA: Diagnosis not present

## 2023-11-08 DIAGNOSIS — Z803 Family history of malignant neoplasm of breast: Secondary | ICD-10-CM | POA: Diagnosis not present

## 2023-11-08 DIAGNOSIS — Z79811 Long term (current) use of aromatase inhibitors: Secondary | ICD-10-CM | POA: Diagnosis not present

## 2023-11-08 DIAGNOSIS — Z806 Family history of leukemia: Secondary | ICD-10-CM | POA: Insufficient documentation

## 2023-11-08 DIAGNOSIS — C50411 Malignant neoplasm of upper-outer quadrant of right female breast: Secondary | ICD-10-CM

## 2023-11-08 DIAGNOSIS — Z807 Family history of other malignant neoplasms of lymphoid, hematopoietic and related tissues: Secondary | ICD-10-CM | POA: Diagnosis not present

## 2023-11-08 DIAGNOSIS — Z800 Family history of malignant neoplasm of digestive organs: Secondary | ICD-10-CM | POA: Insufficient documentation

## 2023-11-08 DIAGNOSIS — Z1732 Human epidermal growth factor receptor 2 negative status: Secondary | ICD-10-CM | POA: Diagnosis not present

## 2023-11-08 DIAGNOSIS — C50919 Malignant neoplasm of unspecified site of unspecified female breast: Secondary | ICD-10-CM

## 2023-11-08 LAB — CMP (CANCER CENTER ONLY)
ALT: 17 U/L (ref 0–44)
AST: 22 U/L (ref 15–41)
Albumin: 4.4 g/dL (ref 3.5–5.0)
Alkaline Phosphatase: 46 U/L (ref 38–126)
Anion gap: 5 (ref 5–15)
BUN: 14 mg/dL (ref 8–23)
CO2: 27 mmol/L (ref 22–32)
Calcium: 10 mg/dL (ref 8.9–10.3)
Chloride: 107 mmol/L (ref 98–111)
Creatinine: 0.93 mg/dL (ref 0.44–1.00)
GFR, Estimated: >60 mL/min (ref >60)
Glucose, Bld: 98 mg/dL (ref 70–99)
Potassium: 4.7 mmol/L (ref 3.5–5.1)
Sodium: 139 mmol/L (ref 135–145)
Total Bilirubin: 0.4 mg/dL (ref 0.0–1.2)
Total Protein: 7.4 g/dL (ref 6.5–8.1)

## 2023-11-08 LAB — CBC WITH DIFFERENTIAL/PLATELET
Abs Immature Granulocytes: 0.01 K/uL (ref 0.00–0.07)
Basophils Absolute: 0.1 K/uL (ref 0.0–0.1)
Basophils Relative: 2 %
Eosinophils Absolute: 0.1 K/uL (ref 0.0–0.5)
Eosinophils Relative: 2 %
HCT: 31.6 % — ABNORMAL LOW (ref 36.0–46.0)
Hemoglobin: 10.3 g/dL — ABNORMAL LOW (ref 12.0–15.0)
Immature Granulocytes: 0 %
Lymphocytes Relative: 31 %
Lymphs Abs: 1 K/uL (ref 0.7–4.0)
MCH: 33.9 pg (ref 26.0–34.0)
MCHC: 32.6 g/dL (ref 30.0–36.0)
MCV: 103.9 fL — ABNORMAL HIGH (ref 80.0–100.0)
Monocytes Absolute: 0.2 K/uL (ref 0.1–1.0)
Monocytes Relative: 7 %
Neutro Abs: 1.9 K/uL (ref 1.7–7.7)
Neutrophils Relative %: 58 %
Platelets: 240 K/uL (ref 150–400)
RBC: 3.04 MIL/uL — ABNORMAL LOW (ref 3.87–5.11)
RDW: 15.9 % — ABNORMAL HIGH (ref 11.5–15.5)
WBC: 3.3 K/uL — ABNORMAL LOW (ref 4.0–10.5)
nRBC: 0 % (ref 0.0–0.2)

## 2023-11-08 NOTE — Progress Notes (Signed)
 [facility] CONSULT NOTE  Patient Care Team: Jolinda Norene HERO, DO as PCP - General (Family Medicine) Kristie Lamprey, MD as Consulting Physician (Gastroenterology) Sheldon Standing, MD as Consulting Physician (General Surgery) Shila Gustav GAILS, MD as Consulting Physician (Gastroenterology) Arelia Filippo, MD as Consulting Physician (Plastic Surgery) Ladora Ross Lacy Phebe, MD as Referring Physician (Optometry) Magda Debby SAILOR, MD as Consulting Physician (Vascular Surgery) Vanderbilt Debby, MD as Consulting Physician (General Surgery) Galileah Piggee, MD as Consulting Physician (Hematology and Oncology)  CHIEF COMPLAINTS/PURPOSE OF CONSULTATION:  Newly diagnosed breast cancer  HISTORY OF PRESENTING ILLNESS:  Penny Howard 78 y.o. female is here because of recent diagnosis of right breast cancer  I reviewed her records extensively and collaborated the history with the patient.  SUMMARY OF ONCOLOGIC HISTORY: Oncology History  Malignant neoplasm of upper-outer quadrant of right breast in female, estrogen receptor positive (HCC)  06/05/2013 Initial Diagnosis   Malignant neoplasm of upper-outer quadrant of right breast in female, estrogen receptor positive (HCC)   06/11/2013 Cancer Staging   Staging form: Breast, AJCC 7th Edition - Clinical: Stage IIA (T2, N0, cM0) - Signed by Loretha Ash, MD on 07/24/2023 Laterality: Right Prognostic indicators: ER/PR +    HER 2 -   Staging comments: Staged at breast conference 06/11/13.    Recurrent breast cancer, right (HCC)  07/17/2023 Breast US    Diffuse heterogeneity with indistinct hypoechoic areas and shadowing throughout the mid to inferior aspect of the right axilla likely indicating an infiltrative process. No distinct focal mass visualized. No focal abnormality over the entire right mastectomy site.   07/17/2023 Pathology Results   Right breast needle core biopsy showed grade 2 invasive lobular cancer, ER +90% strong  staining PR 90% positive strong staining, HER2 negative by FISH and Ki-67 of 15%   07/24/2023 Initial Diagnosis   Recurrent breast cancer, right (HCC)   07/24/2023 Cancer Staging   Staging form: Breast, AJCC 8th Edition - Clinical: Stage IV (cTX, cNX, cM1, G2, ER+, PR+, HER2-) - Signed by Loretha Ash, MD on 09/13/2023 Histologic grading system: 3 grade system    History of Present Illness  Penny Howard is a 77 year old female with breast cancer who presents for follow-up regarding her treatment with Verzenio  and letrozole . She is accompanied by her daughter and husband.  She started taking Verzenio  in late June 2025 and experiences some diarrhea as a side effect, though it is not severe and does not occur daily. She feels that the affected area in the right chest wall and axilla are softer, particularly in the arm. No pain, and her breathing and sleep are satisfactory.  She had a tooth extraction on September 04, 2023, and is awaiting the three-month healing period before starting a bone strengthener.  She recently returned from a family vacation to [facility] and has a family reunion planned for the end of October.  She had a dental extraction 09/04/23, so anticipate starting zometa in mid October.   MEDICAL HISTORY:  Past Medical History:  Diagnosis Date   Abnormal glucose tolerance test 05/02/2011   Allergy    Anxiety    Bruit of right carotid artery 07/18/2018   Cancer (HCC)    breast rt   Family history of breast cancer    Family history of leukemia    Family history of non-Hodgkin's lymphoma    Family history of pancreatic cancer    Family history of stomach cancer    GERD (gastroesophageal reflux disease)  High cholesterol    Hypertension    Liver fibrosis    Osteoporosis    Other malaise and fatigue 02/02/2010   Squamous cell carcinoma    Stress incontinence    Unspecified urinary incontinence 11/03/2004   Wears contact lenses     SURGICAL HISTORY: Past  Surgical History:  Procedure Laterality Date   ABDOMINAL HYSTERECTOMY  1990   ovaries intact b/l   BLADDER SURGERY  10   mesh   BREAST BIOPSY Right 07/17/2023   US  RT BREAST BX W LOC DEV 1ST LESION IMG BX SPEC US  GUIDE 07/17/2023 [facility]   COLONOSCOPY     LIPOSUCTION WITH LIPOFILLING Right 04/24/2014   Procedure: LIPOFILLING TO RIGHT CHEST/RIGHT NIPPLE AREOLA COMPLEX CREATION WITH FULL THICKNESS SKIN GRAFT/REVISION OF RIGHT TRANSFLAP;  Surgeon: Earlis Ranks, MD;  Location: [facility];  Service: Plastics;  Laterality: Right;   MASTECTOMY Right 07/04/2013   MASTECTOMY W/ SENTINEL NODE BIOPSY  07/04/2013   RT TOTAL            DR MIKELL   SCAR REVISION Right 04/24/2014   Procedure: SCAR REVISION;  Surgeon: Earlis Ranks, MD;  Location: [facility];  Service: Plastics;  Laterality: Right;   SIMPLE MASTECTOMY WITH AXILLARY SENTINEL NODE BIOPSY Right 07/04/2013   Procedure: RIGHT TOTAL MASTECTOMY WITH RIGHT  AXILLARY SENTINEL LYMPH NODE BIOPSY;  Surgeon: Morene ONEIDA MIKELL, MD;  Location: [facility] OR;  Service: General;  Laterality: Right;   SKIN FULL THICKNESS GRAFT Right 04/24/2014   Procedure: SKIN GRAFT FULL THICKNESS;  Surgeon: Earlis Ranks, MD;  Location:  SURGERY CENTER;  Service: Plastics;  Laterality: Right;   SQUAMOUS CELL CARCINOMA EXCISION Right    Lower Leg   TRAM Right 12/30/2013   Procedure: TRANSVERSE RECTUS ABDOMINIS MYOCUTANEOUS (TRAM) FLAP FOR RIGHT BREAST RECONSTRUCTION;  Surgeon: Earlis Ranks, MD;  Location: [facility] OR;  Service: Plastics;  Laterality: Right;    SOCIAL HISTORY: Social History   Socioeconomic History   Marital status: Married    Spouse name: Not on file   Number of children: 2   Years of education: 13   Highest education level: Some college, no degree  Occupational History   Occupation: retired  Tobacco Use   Smoking status: Never   Smokeless tobacco: Never  Vaping Use   Vaping status: Never Used   Substance and Sexual Activity   Alcohol use: Yes    Alcohol/week: 5.0 standard drinks of alcohol    Types: 5 Glasses of wine per week    Comment: occaionally   Drug use: No   Sexual activity: Yes  Other Topics Concern   Not on file  Social History Narrative   Not on file   Social Drivers of Health   Financial Resource Strain: Low Risk  (10/17/2023)   Overall Financial Resource Strain (CARDIA)    Difficulty of Paying Living Expenses: Not hard at all  Food Insecurity: No Food Insecurity (10/17/2023)   Hunger Vital Sign    Worried About Running Out of Food in the Last Year: Never true    Ran Out of Food in the Last Year: Never true  Transportation Needs: No Transportation Needs (10/17/2023)   PRAPARE - Administrator, Civil Service (Medical): No    Lack of Transportation (Non-Medical): No  Physical Activity: Insufficiently Active (10/17/2023)   Exercise Vital Sign    Days of Exercise per Week: 2 days    Minutes of Exercise per Session: 40 min  Stress: Stress Concern Present (10/17/2023)   [employer] of Occupational Health - Occupational Stress Questionnaire    Feeling of Stress: To some extent  Social Connections: Moderately Integrated (10/17/2023)   Social Connection and Isolation Panel    Frequency of Communication with Friends and Family: More than three times a week    Frequency of Social Gatherings with Friends and Family: Twice a week    Attends Religious Services: 1 to 4 times per year    Active Member of [employer] or Organizations: No    Attends Engineer, structural: Not on file    Marital Status: Married  Catering manager Violence: Not At Risk (03/29/2023)   Humiliation, Afraid, Rape, and Kick questionnaire    Fear of Current or Ex-Partner: No    Emotionally Abused: No    Physically Abused: No    Sexually Abused: No    FAMILY HISTORY: Family History  Problem Relation Age of Onset   Breast cancer Mother 83   Heart failure Father    Breast  cancer Paternal Aunt 65   Lymphoma Cousin 35       non- hodgkins lymphoma   Stomach cancer Maternal Uncle 18   Pancreatic cancer Other    Leukemia Other    Colon cancer Neg Hx    Rectal cancer Neg Hx    Esophageal cancer Neg Hx    Liver cancer Neg Hx     ALLERGIES:  is allergic to codeine.  MEDICATIONS:  Current Outpatient Medications  Medication Sig Dispense Refill   aspirin EC 81 MG tablet Take 81 mg by mouth daily.     cetirizine  (ZYRTEC ) 10 MG tablet Take 1 tablet by mouth once daily 100 tablet 3   escitalopram  (LEXAPRO ) 10 MG tablet TAKE 1 TABLET DAILY 90 tablet 0   esomeprazole  (NEXIUM ) 20 MG capsule TAKE 1 CAPSULE DAILY AS    NEEDED 90 capsule 0   ivermectin (STROMECTOL) 3 MG TABS tablet Take 12 mg by mouth once.     letrozole  (FEMARA ) 2.5 MG tablet Take 1 tablet (2.5 mg total) by mouth daily. 90 tablet 3   lisinopril  (ZESTRIL ) 10 MG tablet TAKE 1 TABLET DAILY 90 tablet 0   multivitamin-lutein (OCUVITE-LUTEIN) CAPS capsule Take 1 capsule by mouth daily. 250 mg capsule     rosuvastatin  (CRESTOR ) 10 MG tablet TAKE 1 TABLET DAILY. CHANGEIN THERAPY. 90 tablet 0   traZODone  (DESYREL ) 50 MG tablet TAKE 1/2 TO 2 TABLETS AT   BEDTIME AS NEEDED FOR SLEEP 100 tablet 3   VERZENIO  100 MG tablet TAKE 1 TABLET BY MOUTH 2 TIMES A DAY 56 tablet 3   vitamin E 180 MG (400 UNITS) capsule Take 400 Units by mouth daily.     No current facility-administered medications for this visit.     PHYSICAL EXAMINATION: ECOG PERFORMANCE STATUS: 0 - Asymptomatic  Vitals:   11/08/23 1037  BP: 139/64  Pulse: 62  Resp: 15  Temp: 98.3 F (36.8 C)  SpO2: 100%    Filed Weights   11/08/23 1037  Weight: 113 lb 8 oz (51.5 kg)    She appears well. Right axillary mass softer on palpation. Chest: CTA bilaterally RRR No LE edema.  LABORATORY DATA:  I have reviewed the data as listed Lab Results  Component Value Date   WBC 3.3 (L) 11/08/2023   HGB 10.3 (L) 11/08/2023   HCT 31.6 (L) 11/08/2023    MCV 103.9 (H) 11/08/2023   PLT 240 11/08/2023   Lab Results  Component Value Date   NA 139 11/08/2023   K 4.7 11/08/2023   CL 107 11/08/2023   CO2 27 11/08/2023    RADIOGRAPHIC STUDIES: I have personally reviewed the radiological reports and agreed with the findings in the report.  ASSESSMENT AND PLAN:   Assessment and Plan Assessment & Plan Metastatic Invasive lobular carcinoma, ER positive, PR positive, Her 2 neg on abemaciclib  and letrozole . - Order imaging in September to assess disease status. - Continue Verzenio  and letrozole  therapy. - Monitor clinical and laboratory indicators every 3-4 months.  Diarrhea secondary to antineoplastic therapy Intermittent, non-severe diarrhea associated with Verzenio .  Elevated liver enzymes Mild elevation in liver enzymes with unclear etiology, not requiring immediate intervention. - Monitor liver enzyme levels.  Time spent: 30 min   All questions were answered. The patient knows to call the clinic with any problems, questions or concerns.    Amber Stalls, MD 11/08/23

## 2023-11-08 NOTE — Progress Notes (Signed)
 [facility] CONSULT NOTE  Patient Care Team: Jolinda Norene HERO, DO as PCP - General (Family Medicine) Kristie Lamprey, MD as Consulting Physician (Gastroenterology) Sheldon Standing, MD as Consulting Physician (General Surgery) Shila Gustav GAILS, MD as Consulting Physician (Gastroenterology) Arelia Filippo, MD as Consulting Physician (Plastic Surgery) Ladora Ross Lacy Phebe, MD as Referring Physician (Optometry) Magda Debby SAILOR, MD as Consulting Physician (Vascular Surgery) Vanderbilt Debby, MD as Consulting Physician (General Surgery) Jamelah Sitzer, MD as Consulting Physician (Hematology and Oncology)  CHIEF COMPLAINTS/PURPOSE OF CONSULTATION:  Newly diagnosed breast cancer  HISTORY OF PRESENTING ILLNESS:  Penny Howard 78 y.o. female is here because of recent diagnosis of right breast cancer  I reviewed her records extensively and collaborated the history with the patient.  SUMMARY OF ONCOLOGIC HISTORY: Oncology History  Malignant neoplasm of upper-outer quadrant of right breast in female, estrogen receptor positive (HCC)  06/05/2013 Initial Diagnosis   Malignant neoplasm of upper-outer quadrant of right breast in female, estrogen receptor positive (HCC)   06/11/2013 Cancer Staging   Staging form: Breast, AJCC 7th Edition - Clinical: Stage IIA (T2, N0, cM0) - Signed by Loretha Ash, MD on 07/24/2023 Laterality: Right Prognostic indicators: ER/PR +    HER 2 -   Staging comments: Staged at breast conference 06/11/13.    Recurrent breast cancer, right (HCC)  07/17/2023 Breast US    Diffuse heterogeneity with indistinct hypoechoic areas and shadowing throughout the mid to inferior aspect of the right axilla likely indicating an infiltrative process. No distinct focal mass visualized. No focal abnormality over the entire right mastectomy site.   07/17/2023 Pathology Results   Right breast needle core biopsy showed grade 2 invasive lobular cancer, ER +90% strong  staining PR 90% positive strong staining, HER2 negative by FISH and Ki-67 of 15%   07/24/2023 Initial Diagnosis   Recurrent breast cancer, right (HCC)   07/24/2023 Cancer Staging   Staging form: Breast, AJCC 8th Edition - Clinical: Stage IV (cTX, cNX, cM1, G2, ER+, PR+, HER2-) - Signed by Loretha Ash, MD on 09/13/2023 Histologic grading system: 3 grade system    History of Present Illness  Penny Howard is a 78 year old female with metastatic breast cancer who presents for follow-up in hematology and medical oncology.  She recently experienced a urinary tract infection and received antibiotics from her regular doctor. She is now over it and has no current symptoms or pain.  She is on a treatment regimen for breast cancer, taking Verzenio  and letrozole , and confirms adherence to her medication schedule without adverse effects.  A CT scan is scheduled for September 25th, involving both oral and IV contrast.  She had a dental extraction on July 15th, and her dentist, [facility],  She inquires about the frequency and administration of bone strengtheners, which she understands to be an infusion every three months.  She plans to have a yearly check-up with her family doctor in October and inquires about receiving a flu shot during that visit.  She reports feeling well overall, sleeping well, and having been at the beach last week.   MEDICAL HISTORY:  Past Medical History:  Diagnosis Date   Abnormal glucose tolerance test 05/02/2011   Allergy    Anxiety    Bruit of right carotid artery 07/18/2018   Cancer [facility])    breast rt   Family history of breast cancer    Family history of leukemia    Family history of non-Hodgkin's lymphoma    Family  history of pancreatic cancer    Family history of stomach cancer    GERD (gastroesophageal reflux disease)    High cholesterol    Hypertension    Liver fibrosis    Osteoporosis    Other malaise and fatigue 02/02/2010    Squamous cell carcinoma    Stress incontinence    Unspecified urinary incontinence 11/03/2004   Wears contact lenses     SURGICAL HISTORY: Past Surgical History:  Procedure Laterality Date   ABDOMINAL HYSTERECTOMY  1990   ovaries intact b/l   BLADDER SURGERY  10   mesh   BREAST BIOPSY Right 07/17/2023   US  RT BREAST BX W LOC DEV 1ST LESION IMG BX SPEC US  GUIDE 07/17/2023 [facility]   COLONOSCOPY     LIPOSUCTION WITH LIPOFILLING Right 04/24/2014   Procedure: LIPOFILLING TO RIGHT CHEST/RIGHT NIPPLE AREOLA COMPLEX CREATION WITH FULL THICKNESS SKIN GRAFT/REVISION OF RIGHT TRANSFLAP;  Surgeon: Earlis Ranks, MD;  Location: [facility];  Service: Plastics;  Laterality: Right;   MASTECTOMY Right 07/04/2013   MASTECTOMY W/ SENTINEL NODE BIOPSY  07/04/2013   RT TOTAL            DR MIKELL   SCAR REVISION Right 04/24/2014   Procedure: SCAR REVISION;  Surgeon: Earlis Ranks, MD;  Location: [facility];  Service: Plastics;  Laterality: Right;   SIMPLE MASTECTOMY WITH AXILLARY SENTINEL NODE BIOPSY Right 07/04/2013   Procedure: RIGHT TOTAL MASTECTOMY WITH RIGHT  AXILLARY SENTINEL LYMPH NODE BIOPSY;  Surgeon: Morene ONEIDA MIKELL, MD;  Location: [facility];  Service: General;  Laterality: Right;   SKIN FULL THICKNESS GRAFT Right 04/24/2014   Procedure: SKIN GRAFT FULL THICKNESS;  Surgeon: Earlis Ranks, MD;  Location: [facility];  Service: Plastics;  Laterality: Right;   SQUAMOUS CELL CARCINOMA EXCISION Right    Lower Leg   TRAM Right 12/30/2013   Procedure: TRANSVERSE RECTUS ABDOMINIS MYOCUTANEOUS (TRAM) FLAP FOR RIGHT BREAST RECONSTRUCTION;  Surgeon: Earlis Ranks, MD;  Location: [facility];  Service: Plastics;  Laterality: Right;    SOCIAL HISTORY: Social History   Socioeconomic History   Marital status: Married    Spouse name: Not on file   Number of children: 2   Years of education: 13   Highest education level: Some college, no degree   Occupational History   Occupation: retired  Tobacco Use   Smoking status: Never   Smokeless tobacco: Never  Vaping Use   Vaping status: Never Used  Substance and Sexual Activity   Alcohol use: Yes    Alcohol/week: 5.0 standard drinks of alcohol    Types: 5 Glasses of wine per week    Comment: occaionally   Drug use: No   Sexual activity: Yes  Other Topics Concern   Not on file  Social History Narrative   Not on file   Social Drivers of Health   Financial Resource Strain: Low Risk  (10/17/2023)   Overall Financial Resource Strain (CARDIA)    Difficulty of Paying Living Expenses: Not hard at all  Food Insecurity: No Food Insecurity (10/17/2023)   Hunger Vital Sign    Worried About Running Out of Food in the Last Year: Never true    Ran Out of Food in the Last Year: Never true  Transportation Needs: No Transportation Needs (10/17/2023)   PRAPARE - Administrator, Civil Service (Medical): No    Lack of Transportation (Non-Medical): No  Physical Activity: Insufficiently Active (10/17/2023)   Exercise Vital  Sign    Days of Exercise per Week: 2 days    Minutes of Exercise per Session: 40 min  Stress: Stress Concern Present (10/17/2023)   [employer] of Occupational Health - Occupational Stress Questionnaire    Feeling of Stress: To some extent  Social Connections: Moderately Integrated (10/17/2023)   Social Connection and Isolation Panel    Frequency of Communication with Friends and Family: More than three times a week    Frequency of Social Gatherings with Friends and Family: Twice a week    Attends Religious Services: 1 to 4 times per year    Active Member of [employer] or Organizations: No    Attends Engineer, structural: Not on file    Marital Status: Married  Catering manager Violence: Not At Risk (03/29/2023)   Humiliation, Afraid, Rape, and Kick questionnaire    Fear of Current or Ex-Partner: No    Emotionally Abused: No    Physically Abused: No     Sexually Abused: No    FAMILY HISTORY: Family History  Problem Relation Age of Onset   Breast cancer Mother 46   Heart failure Father    Breast cancer Paternal Aunt 21   Lymphoma Cousin 35       non- hodgkins lymphoma   Stomach cancer Maternal Uncle 18   Pancreatic cancer Other    Leukemia Other    Colon cancer Neg Hx    Rectal cancer Neg Hx    Esophageal cancer Neg Hx    Liver cancer Neg Hx     ALLERGIES:  is allergic to codeine.  MEDICATIONS:  Current Outpatient Medications  Medication Sig Dispense Refill   aspirin EC 81 MG tablet Take 81 mg by mouth daily.     cetirizine  (ZYRTEC ) 10 MG tablet Take 1 tablet by mouth once daily 100 tablet 3   escitalopram  (LEXAPRO ) 10 MG tablet TAKE 1 TABLET DAILY 90 tablet 0   esomeprazole  (NEXIUM ) 20 MG capsule TAKE 1 CAPSULE DAILY AS    NEEDED 90 capsule 0   ivermectin (STROMECTOL) 3 MG TABS tablet Take 12 mg by mouth once.     letrozole  (FEMARA ) 2.5 MG tablet Take 1 tablet (2.5 mg total) by mouth daily. 90 tablet 3   lisinopril  (ZESTRIL ) 10 MG tablet TAKE 1 TABLET DAILY 90 tablet 0   multivitamin-lutein (OCUVITE-LUTEIN) CAPS capsule Take 1 capsule by mouth daily. 250 mg capsule     rosuvastatin  (CRESTOR ) 10 MG tablet TAKE 1 TABLET DAILY. CHANGEIN THERAPY. 90 tablet 0   traZODone  (DESYREL ) 50 MG tablet TAKE 1/2 TO 2 TABLETS AT   BEDTIME AS NEEDED FOR SLEEP 100 tablet 3   VERZENIO  100 MG tablet TAKE 1 TABLET BY MOUTH 2 TIMES A DAY 56 tablet 3   vitamin E 180 MG (400 UNITS) capsule Take 400 Units by mouth daily.     No current facility-administered medications for this visit.     PHYSICAL EXAMINATION: ECOG PERFORMANCE STATUS: 0 - Asymptomatic  Vitals:   11/08/23 1037  BP: 139/64  Pulse: 62  Resp: 15  Temp: 98.3 F (36.8 C)  SpO2: 100%    Filed Weights   11/08/23 1037  Weight: 113 lb 8 oz (51.5 kg)    She appears well. Right axillary mass has improved significantly,  Chest: CTA bilaterally RRR No LE  edema.  LABORATORY DATA:  I have reviewed the data as listed Lab Results  Component Value Date   WBC 3.3 (L) 11/08/2023   HGB 10.3 (  L) 11/08/2023   HCT 31.6 (L) 11/08/2023   MCV 103.9 (H) 11/08/2023   PLT 240 11/08/2023   Lab Results  Component Value Date   NA 139 11/08/2023   K 4.7 11/08/2023   CL 107 11/08/2023   CO2 27 11/08/2023    RADIOGRAPHIC STUDIES: I have personally reviewed the radiological reports and agreed with the findings in the report.  ASSESSMENT AND PLAN:   Assessment and Plan Assessment & Plan Metastatic Invasive lobular carcinoma, ER positive, PR positive, Her 2 neg on abemaciclib  and letrozole . On Verzenio  and letrozole  with stable blood counts. No new symptoms or pain reported. - Continue Verzenio  and letrozole . - Order CT scan with oral and IV contrast on November 15, 2023. - Review CT scan results once available.  Post-dental extraction management Awaiting dental clearance to initiate bone strengthener. - Follow up with [facility] to confirm dental clearance. - Order bone strengthener infusion for December 05, 2023, approximately 3 months from her previous extraction - Administer bone strengthener infusion every three months.  Time spent: 30 min   All questions were answered. The patient knows to call the clinic with any problems, questions or concerns.    Amber Stalls, MD 11/08/23

## 2023-11-09 ENCOUNTER — Telehealth: Payer: Self-pay

## 2023-11-09 LAB — CANCER ANTIGEN 15-3: CA 15-3: 38.8 U/mL — ABNORMAL HIGH (ref 0.0–25.0)

## 2023-11-09 LAB — CANCER ANTIGEN 27.29: CA 27.29: 65.5 U/mL — ABNORMAL HIGH (ref 0.0–38.6)

## 2023-11-09 NOTE — Telephone Encounter (Signed)
 Call placed to [facility] to find out if dental clearance had been obtained. They have no record of this. Dental clearance form faxed to Katie at [facility] with instructions to send back ASAP.

## 2023-11-14 ENCOUNTER — Telehealth: Payer: Self-pay

## 2023-11-14 NOTE — Telephone Encounter (Signed)
 Dental clearance received with no objections to treatment, verifying that pt receives regular dental care. Sent to HIM for scanning.

## 2023-11-15 ENCOUNTER — Ambulatory Visit (HOSPITAL_COMMUNITY)
Admission: RE | Admit: 2023-11-15 | Discharge: 2023-11-15 | Disposition: A | Discharge status: Home / Self Care | Source: Ambulatory Visit | Attending: Hematology and Oncology | Admitting: Hematology and Oncology

## 2023-11-15 ENCOUNTER — Ambulatory Visit: Payer: Self-pay | Admitting: Hematology and Oncology

## 2023-11-15 DIAGNOSIS — C50911 Malignant neoplasm of unspecified site of right female breast: Secondary | ICD-10-CM | POA: Diagnosis not present

## 2023-11-15 DIAGNOSIS — R590 Localized enlarged lymph nodes: Secondary | ICD-10-CM | POA: Diagnosis not present

## 2023-11-15 DIAGNOSIS — C50919 Malignant neoplasm of unspecified site of unspecified female breast: Secondary | ICD-10-CM | POA: Diagnosis not present

## 2023-11-15 DIAGNOSIS — C7951 Secondary malignant neoplasm of bone: Secondary | ICD-10-CM | POA: Diagnosis not present

## 2023-11-15 MED ORDER — IOHEXOL 300 MG/ML  SOLN
80.0000 mL | Freq: Once | INTRAMUSCULAR | Status: AC | PRN
  Administered 2023-11-15: 80 mL via INTRAVENOUS

## 2023-11-15 MED ORDER — IOHEXOL 9 MG/ML PO SOLN
500.0000 mL | ORAL | Status: AC
  Administered 2023-11-15: 500 mL via ORAL

## 2023-11-19 ENCOUNTER — Other Ambulatory Visit: Payer: Self-pay | Admitting: Family Medicine

## 2023-11-30 ENCOUNTER — Ambulatory Visit: Payer: Medicare Other | Admitting: Family Medicine

## 2023-11-30 ENCOUNTER — Encounter: Payer: Self-pay | Admitting: Family Medicine

## 2023-11-30 VITALS — BP 107/62 | HR 61 | Temp 97.7°F | Ht 59.0 in | Wt 116.4 lb

## 2023-11-30 DIAGNOSIS — F411 Generalized anxiety disorder: Secondary | ICD-10-CM | POA: Diagnosis not present

## 2023-11-30 DIAGNOSIS — M810 Age-related osteoporosis without current pathological fracture: Secondary | ICD-10-CM

## 2023-11-30 DIAGNOSIS — I10 Essential (primary) hypertension: Secondary | ICD-10-CM | POA: Diagnosis not present

## 2023-11-30 DIAGNOSIS — E782 Mixed hyperlipidemia: Secondary | ICD-10-CM

## 2023-11-30 DIAGNOSIS — F5101 Primary insomnia: Secondary | ICD-10-CM

## 2023-11-30 DIAGNOSIS — L309 Dermatitis, unspecified: Secondary | ICD-10-CM

## 2023-11-30 DIAGNOSIS — C50911 Malignant neoplasm of unspecified site of right female breast: Secondary | ICD-10-CM

## 2023-11-30 DIAGNOSIS — I6521 Occlusion and stenosis of right carotid artery: Secondary | ICD-10-CM | POA: Diagnosis not present

## 2023-11-30 DIAGNOSIS — Z23 Encounter for immunization: Secondary | ICD-10-CM

## 2023-11-30 DIAGNOSIS — K2100 Gastro-esophageal reflux disease with esophagitis, without bleeding: Secondary | ICD-10-CM | POA: Diagnosis not present

## 2023-11-30 MED ORDER — ROSUVASTATIN CALCIUM 10 MG PO TABS: 10.0000 mg | ORAL_TABLET | Freq: Every day | ORAL | 3 refills | Status: AC

## 2023-11-30 MED ORDER — ESOMEPRAZOLE MAGNESIUM 20 MG PO CPDR: 20.0000 mg | DELAYED_RELEASE_CAPSULE | Freq: Every day | ORAL | 3 refills | Status: AC

## 2023-11-30 MED ORDER — TRAZODONE HCL 50 MG PO TABS: ORAL_TABLET | ORAL | 3 refills | Status: AC

## 2023-11-30 MED ORDER — CETIRIZINE HCL 10 MG PO TABS: 10.0000 mg | ORAL_TABLET | Freq: Every day | ORAL | 3 refills | Status: AC

## 2023-11-30 MED ORDER — ESCITALOPRAM OXALATE 10 MG PO TABS: 10.0000 mg | ORAL_TABLET | Freq: Every day | ORAL | 3 refills | Status: AC

## 2023-11-30 MED ORDER — LISINOPRIL 10 MG PO TABS: 10.0000 mg | ORAL_TABLET | Freq: Every day | ORAL | 3 refills | Status: AC

## 2023-11-30 MED ORDER — TRIAMCINOLONE ACETONIDE 0.1 % EX CREA: 1.0000 | TOPICAL_CREAM | Freq: Two times a day (BID) | CUTANEOUS | 0 refills | Status: DC | PRN

## 2023-11-30 NOTE — Progress Notes (Signed)
 Penny Howard is a 78 y.o. female presents to office today for annual physical exam examination.    She reports that she has been doing fairly well.  She is on multiple modalities of treatment for recurrent breast cancer that has metastasized to her bones, currently on Verzenio , Femara  and Ivermectin.  She is under the care of oncology with Dr. Loretha, whom she has had an excellent interaction with.  She will be put on bone building medication on the 18th of the month.  She denies being symptomatic at all except for maybe some intermittent fatigue but has not had any issues with bony pain.  From mental health standpoint she feels like she is doing well and continues to take her Lexapro  and trazodone  as directed.  She feels very well supported by her family and friends and reports that the birth of a new grandbaby, Burnetta Cough, about 6 months ago.  She does report a little bit of a itchy rash on her chest.  Utilizing Zyrtec  and normal lotions.  Uncertain exposure as she does not utilize perfumes etc.  Occupation: retired, Marital status: [name], Substance use: none (started eliminating her glass of wine several months ago in efforts to be eating cleaner in the setting of cancer) Health Maintenance Due  Topic Date Due   COVID-19 Vaccine (3 - Moderna risk series) 05/19/2019   DTaP/Tdap/Td (2 - Td or Tdap) 02/03/2020   Mammogram  10/13/2023    Immunization History  Administered Date(s) Administered   Fluad Quad(high Dose 65+) 01/09/2019, 01/19/2021, 11/16/2021   Fluad Trivalent(High Dose 65+) 11/28/2022   INFLUENZA, HIGH DOSE SEASONAL PF 02/08/2018   Influenza-Unspecified 12/14/2005   Moderna Sars-Covid-2 Vaccination 03/24/2019, 04/21/2019   Pneumococcal Conjugate-13 04/04/2017   Pneumococcal Polysaccharide-23 08/05/2019   Tdap 02/02/2010   Zoster Recombinant(Shingrix) 09/20/2020, 03/23/2021   Past Medical History:  Diagnosis Date   Abnormal glucose tolerance test 05/02/2011    Allergy    Anxiety    Bruit of right carotid artery 07/18/2018   Cancer (HCC)    breast rt   Family history of breast cancer    Family history of leukemia    Family history of non-Hodgkin's lymphoma    Family history of pancreatic cancer    Family history of stomach cancer    GERD (gastroesophageal reflux disease)    High cholesterol    Hypertension    Liver fibrosis    Osteoporosis    Other malaise and fatigue 02/02/2010   Squamous cell carcinoma    Stress incontinence    Unspecified urinary incontinence 11/03/2004   Wears contact lenses    Social History   Socioeconomic History   Marital status: Married    Spouse name: Not on file   Number of children: 2   Years of education: 13   Highest education level: Some college, no degree  Occupational History   Occupation: retired  Tobacco Use   Smoking status: Never   Smokeless tobacco: Never  Vaping Use   Vaping status: Never Used  Substance and Sexual Activity   Alcohol use: Yes    Alcohol/week: 5.0 standard drinks of alcohol    Types: 5 Glasses of wine per week    Comment: occaionally   Drug use: No   Sexual activity: Yes  Other Topics Concern   Not on file  Social History Narrative   Not on file   Social Drivers of Health   Financial Resource Strain: Low Risk  (10/17/2023)   Overall Financial Resource  Strain (CARDIA)    Difficulty of Paying Living Expenses: Not hard at all  Food Insecurity: No Food Insecurity (10/17/2023)   Hunger Vital Sign    Worried About Running Out of Food in the Last Year: Never true    Ran Out of Food in the Last Year: Never true  Transportation Needs: No Transportation Needs (10/17/2023)   PRAPARE - Administrator, Civil Service (Medical): No    Lack of Transportation (Non-Medical): No  Physical Activity: Insufficiently Active (10/17/2023)   Exercise Vital Sign    Days of Exercise per Week: 2 days    Minutes of Exercise per Session: 40 min  Stress: Stress Concern Present  (10/17/2023)   [employer] of Occupational Health - Occupational Stress Questionnaire    Feeling of Stress: To some extent  Social Connections: Moderately Integrated (10/17/2023)   Social Connection and Isolation Panel    Frequency of Communication with Friends and Family: More than three times a week    Frequency of Social Gatherings with Friends and Family: Twice a week    Attends Religious Services: 1 to 4 times per year    Active Member of [employer] or Organizations: No    Attends Engineer, structural: Not on file    Marital Status: Married  Catering manager Violence: Not At Risk (03/29/2023)   Humiliation, Afraid, Rape, and Kick questionnaire    Fear of Current or Ex-Partner: No    Emotionally Abused: No    Physically Abused: No    Sexually Abused: No   Past Surgical History:  Procedure Laterality Date   ABDOMINAL HYSTERECTOMY  1990   ovaries intact b/l   BLADDER SURGERY  10   mesh   BREAST BIOPSY Right 07/17/2023   US  RT BREAST BX W LOC DEV 1ST LESION IMG BX SPEC US  GUIDE 07/17/2023 [facility]MO[facility]   COLONOSCOPY     LIPOSUCTION WITH LIPOFILLING Right 04/24/2014   Procedure: LIPOFILLING TO RIGHT CHEST/RIGHT NIPPLE AREOLA COMPLEX CREATION WITH FULL THICKNESS SKIN GRAFT/REVISION OF RIGHT TRANSFLAP;  Surgeon: Earlis Ranks, MD;  Location: [facility];  Service: Plastics;  Laterality: Right;   MASTECTOMY Right 07/04/2013   MASTECTOMY W/ SENTINEL NODE BIOPSY  07/04/2013   RT TOTAL            DR MIKELL   SCAR REVISION Right 04/24/2014   Procedure: SCAR REVISION;  Surgeon: Earlis Ranks, MD;  Location: [facility];  Service: Plastics;  Laterality: Right;   SIMPLE MASTECTOMY WITH AXILLARY SENTINEL NODE BIOPSY Right 07/04/2013   Procedure: RIGHT TOTAL MASTECTOMY WITH RIGHT  AXILLARY SENTINEL LYMPH NODE BIOPSY;  Surgeon: Morene ONEIDA MIKELL, MD;  Location: [facility];  Service: General;  Laterality: Right;   SKIN FULL THICKNESS GRAFT Right 04/24/2014    Procedure: SKIN GRAFT FULL THICKNESS;  Surgeon: Earlis Ranks, MD;  Location: [facility];  Service: Plastics;  Laterality: Right;   SQUAMOUS CELL CARCINOMA EXCISION Right    Lower Leg   TRAM Right 12/30/2013   Procedure: TRANSVERSE RECTUS ABDOMINIS MYOCUTANEOUS (TRAM) FLAP FOR RIGHT BREAST RECONSTRUCTION;  Surgeon: Earlis Ranks, MD;  Location: [facility];  Service: Plastics;  Laterality: Right;   Family History  Problem Relation Age of Onset   Breast cancer Mother 50   Heart failure Father    Breast cancer Paternal Aunt 60   Lymphoma Cousin 35       non- hodgkins lymphoma   Stomach cancer Maternal Uncle 18   Pancreatic cancer Other  Leukemia Other    Colon cancer Neg Hx    Rectal cancer Neg Hx    Esophageal cancer Neg Hx    Liver cancer Neg Hx     Current Outpatient Medications:    aspirin EC 81 MG tablet, Take 81 mg by mouth daily., Disp: , Rfl:    cetirizine  (ZYRTEC ) 10 MG tablet, Take 1 tablet by mouth once daily, Disp: 90 tablet, Rfl: 1   escitalopram  (LEXAPRO ) 10 MG tablet, TAKE 1 TABLET DAILY, Disp: 90 tablet, Rfl: 0   esomeprazole  (NEXIUM ) 20 MG capsule, TAKE 1 CAPSULE DAILY AS    NEEDED, Disp: 90 capsule, Rfl: 0   ivermectin (STROMECTOL) 3 MG TABS tablet, Take 12 mg by mouth once., Disp: , Rfl:    letrozole  (FEMARA ) 2.5 MG tablet, Take 1 tablet (2.5 mg total) by mouth daily., Disp: 90 tablet, Rfl: 3   lisinopril  (ZESTRIL ) 10 MG tablet, TAKE 1 TABLET DAILY, Disp: 90 tablet, Rfl: 0   multivitamin-lutein (OCUVITE-LUTEIN) CAPS capsule, Take 1 capsule by mouth daily. 250 mg capsule, Disp: , Rfl:    rosuvastatin  (CRESTOR ) 10 MG tablet, TAKE 1 TABLET DAILY. CHANGEIN THERAPY., Disp: 90 tablet, Rfl: 0   traZODone  (DESYREL ) 50 MG tablet, TAKE 1/2 TO 2 TABLETS AT   BEDTIME AS NEEDED FOR SLEEP, Disp: 100 tablet, Rfl: 3   VERZENIO  100 MG tablet, TAKE 1 TABLET BY MOUTH 2 TIMES A DAY, Disp: 56 tablet, Rfl: 3   vitamin E 180 MG (400 UNITS) capsule, Take 400 Units by mouth  daily., Disp: , Rfl:   Allergies  Allergen Reactions   Codeine Nausea Only     ROS: Review of Systems Pertinent items noted in HPI and remainder of comprehensive ROS otherwise negative.    Physical exam BP 107/62   Pulse 61   Temp 97.7 F (36.5 C)   Ht 4' 11 (1.499 m)   Wt 116 lb 6 oz (52.8 kg)   SpO2 96%   BMI 23.50 kg/m  General appearance: alert, cooperative, appears stated age, and no distress Head: Normocephalic, without obvious abnormality, atraumatic Eyes: negative findings: lids and lashes normal, conjunctivae and sclerae normal, corneas clear, and pupils equal, round, reactive to light and accomodation Ears: normal TM's and external ear canals both ears Nose: Nares normal. Septum midline. Mucosa normal. No drainage or sinus tenderness. Throat: lips, mucosa, and tongue normal; teeth and gums normal Neck: no adenopathy, supple, symmetrical, trachea midline, thyroid  not enlarged, symmetric, no tenderness/mass/nodules, and right sided carotid bruit present Back: symmetric, no curvature. ROM normal. No CVA tenderness. Lungs: clear to auscultation bilaterally Heart: regular rate and rhythm, S1, S2 normal, no murmur, click, rub or gallop Abdomen: soft, non-tender; bowel sounds normal; no masses,  no organomegaly Extremities: extremities normal, atraumatic, no cyanosis or edema Pulses: 2+ and symmetric Skin: Minimally erythematous maculopapular rash noted along the chest and upper dcolletage Lymph nodes: Cervical, supraclavicular nodes normal. Neurologic: Grossly normal      11/30/2023    9:33 AM 10/18/2023    9:09 AM 03/29/2023    4:57 PM  Depression screen PHQ 2/9  Decreased Interest 0 0 0  Down, Depressed, Hopeless 0 0 0  PHQ - 2 Score 0 0 0  Altered sleeping 1 0   Tired, decreased energy 0 0   Change in appetite 0 0   Feeling bad or failure about yourself  0 0   Trouble concentrating 0 0   Moving slowly or fidgety/restless 0 0   Suicidal thoughts 0  0   PHQ-9  Score 1 0   Difficult doing work/chores Not difficult at all Not difficult at all       11/30/2023    9:33 AM 10/18/2023    9:09 AM 11/28/2022   11:17 AM 11/16/2021    1:04 PM  GAD 7 : Generalized Anxiety Score  Nervous, Anxious, on Edge 0 0 0 0  Control/stop worrying 0 0 0 0  Worry too much - different things 0 0 0 0  Trouble relaxing 0 0 0 0  Restless 0 0 0 0  Easily annoyed or irritable 0 0 0 0  Afraid - awful might happen 0 0 0 0  Total GAD 7 Score 0 0 0 0  Anxiety Difficulty Not difficult at all Not difficult at all Not difficult at all Not difficult at all    Assessment/ Plan: Penny Howard here for annual physical exam.   Mixed hyperlipidemia - Plan: Lipid Panel, rosuvastatin  (CRESTOR ) 10 MG tablet  Stenosis of right carotid artery - Plan: rosuvastatin  (CRESTOR ) 10 MG tablet  Benign essential hypertension - Plan: lisinopril  (ZESTRIL ) 10 MG tablet  Age-related osteoporosis without current pathological fracture - Plan: VITAMIN D  25 Hydroxy (Vit-D Deficiency, Fractures), DG [facility] DEXA  Recurrent breast cancer, right (HCC)  Gastroesophageal reflux disease with esophagitis without hemorrhage - Plan: esomeprazole  (NEXIUM ) 20 MG capsule  Anxiety state - Plan: escitalopram  (LEXAPRO ) 10 MG tablet  Primary insomnia - Plan: traZODone  (DESYREL ) 50 MG tablet  Dermatitis - Plan: triamcinolone  cream (KENALOG ) 0.1 %  Encounter for immunization - Plan: Flu vaccine HIGH DOSE PF(Fluzone Trivalent)   Fasting labs collected.  Statin renewed.  Blood pressure under good control and we could consider reducing blood pressure remedy to 5 mg daily given intentional weight loss and dietary modifications.  She is not having any abnormal symptoms including dizziness so for now we will continue current course  Known osteoporosis and going to be started on medication soon.  I ordered a future DEXA but it sounds like this is being managed by oncology  For her recurrent breast cancer, being very  closely followed by oncology and apparent prognosis is a few years  GERD well-controlled with daily Nexium  and this has been recommended to continue throughout the course of treatment  Anxiety stable with Lexapro  and insomnia controlled with trazodone   Triamcinolone  given for as needed use  Influenza vaccination administered.  She declined tetanus and was advised not to get mammogram   Counseled on healthy lifestyle choices, including diet (rich in fruits, vegetables and lean meats and low in salt and simple carbohydrates) and exercise (at least 30 minutes of moderate physical activity daily).  Patient to follow up 6-37m  Frankie Scipio M. Jolinda, DO

## 2023-12-01 LAB — LIPID PANEL
Chol/HDL Ratio: 2 ratio (ref 0.0–4.4)
Cholesterol, Total: 129 mg/dL (ref 100–199)
HDL: 63 mg/dL (ref >39)
LDL Chol Calc (NIH): 49 mg/dL (ref 0–99)
Triglycerides: 87 mg/dL (ref 0–149)
VLDL Cholesterol Cal: 17 mg/dL (ref 5–40)

## 2023-12-01 LAB — VITAMIN D 25 HYDROXY (VIT D DEFICIENCY, FRACTURES): Vit D, 25-Hydroxy: 49.5 ng/mL (ref 30.0–100.0)

## 2023-12-03 ENCOUNTER — Ambulatory Visit: Payer: Self-pay | Admitting: Family Medicine

## 2023-12-03 NOTE — Telephone Encounter (Signed)
 Patient aware and verbalized understanding.

## 2023-12-06 ENCOUNTER — Inpatient Hospital Stay: Attending: Hematology and Oncology

## 2023-12-06 ENCOUNTER — Inpatient Hospital Stay: Admitting: Hematology and Oncology

## 2023-12-06 ENCOUNTER — Inpatient Hospital Stay

## 2023-12-06 VITALS — BP 125/54 | HR 67 | Temp 98.0°F | Resp 17 | Wt 116.2 lb

## 2023-12-06 DIAGNOSIS — Z1721 Progesterone receptor positive status: Secondary | ICD-10-CM | POA: Diagnosis not present

## 2023-12-06 DIAGNOSIS — C50919 Malignant neoplasm of unspecified site of unspecified female breast: Secondary | ICD-10-CM

## 2023-12-06 DIAGNOSIS — Z803 Family history of malignant neoplasm of breast: Secondary | ICD-10-CM | POA: Insufficient documentation

## 2023-12-06 DIAGNOSIS — Z800 Family history of malignant neoplasm of digestive organs: Secondary | ICD-10-CM | POA: Insufficient documentation

## 2023-12-06 DIAGNOSIS — C50411 Malignant neoplasm of upper-outer quadrant of right female breast: Secondary | ICD-10-CM | POA: Insufficient documentation

## 2023-12-06 DIAGNOSIS — Z807 Family history of other malignant neoplasms of lymphoid, hematopoietic and related tissues: Secondary | ICD-10-CM | POA: Insufficient documentation

## 2023-12-06 DIAGNOSIS — Z17411 Hormone receptor positive with human epidermal growth factor receptor 2 negative status: Secondary | ICD-10-CM | POA: Insufficient documentation

## 2023-12-06 DIAGNOSIS — Z79811 Long term (current) use of aromatase inhibitors: Secondary | ICD-10-CM | POA: Diagnosis not present

## 2023-12-06 DIAGNOSIS — Z170 Estrogen receptor positive status [ER+]: Secondary | ICD-10-CM

## 2023-12-06 DIAGNOSIS — C50911 Malignant neoplasm of unspecified site of right female breast: Secondary | ICD-10-CM | POA: Diagnosis not present

## 2023-12-06 DIAGNOSIS — Z806 Family history of leukemia: Secondary | ICD-10-CM | POA: Insufficient documentation

## 2023-12-06 LAB — CBC WITH DIFFERENTIAL/PLATELET
Abs Immature Granulocytes: 0 K/uL (ref 0.00–0.07)
Basophils Absolute: 0.1 K/uL (ref 0.0–0.1)
Basophils Relative: 2 %
Eosinophils Absolute: 0.1 K/uL (ref 0.0–0.5)
Eosinophils Relative: 2 %
HCT: 31.8 % — ABNORMAL LOW (ref 36.0–46.0)
Hemoglobin: 10.9 g/dL — ABNORMAL LOW (ref 12.0–15.0)
Immature Granulocytes: 0 %
Lymphocytes Relative: 39 %
Lymphs Abs: 1.3 K/uL (ref 0.7–4.0)
MCH: 35.9 pg — ABNORMAL HIGH (ref 26.0–34.0)
MCHC: 34.3 g/dL (ref 30.0–36.0)
MCV: 104.6 fL — ABNORMAL HIGH (ref 80.0–100.0)
Monocytes Absolute: 0.3 K/uL (ref 0.1–1.0)
Monocytes Relative: 9 %
Neutro Abs: 1.6 K/uL — ABNORMAL LOW (ref 1.7–7.7)
Neutrophils Relative %: 48 %
Platelets: 209 K/uL (ref 150–400)
RBC: 3.04 MIL/uL — ABNORMAL LOW (ref 3.87–5.11)
RDW: 13.9 % (ref 11.5–15.5)
WBC: 3.4 K/uL — ABNORMAL LOW (ref 4.0–10.5)
nRBC: 0 % (ref 0.0–0.2)

## 2023-12-06 LAB — CMP (CANCER CENTER ONLY)
ALT: 13 U/L (ref 0–44)
AST: 21 U/L (ref 15–41)
Albumin: 4.6 g/dL (ref 3.5–5.0)
Alkaline Phosphatase: 49 U/L (ref 38–126)
Anion gap: 7 (ref 5–15)
BUN: 16 mg/dL (ref 8–23)
CO2: 25 mmol/L (ref 22–32)
Calcium: 10.7 mg/dL — ABNORMAL HIGH (ref 8.9–10.3)
Chloride: 108 mmol/L (ref 98–111)
Creatinine: 0.88 mg/dL (ref 0.44–1.00)
GFR, Estimated: >60 mL/min (ref >60)
Glucose, Bld: 98 mg/dL (ref 70–99)
Potassium: 4.4 mmol/L (ref 3.5–5.1)
Sodium: 140 mmol/L (ref 135–145)
Total Bilirubin: 0.5 mg/dL (ref 0.0–1.2)
Total Protein: 7.4 g/dL (ref 6.5–8.1)

## 2023-12-06 MED ORDER — ZOLEDRONIC ACID 4 MG/100ML IV SOLN
4.0000 mg | Freq: Once | INTRAVENOUS | Status: AC
  Administered 2023-12-06: 4 mg via INTRAVENOUS
  Filled 2023-12-06: qty 100

## 2023-12-06 MED ORDER — SODIUM CHLORIDE 0.9 % IV SOLN: INTRAVENOUS | Status: DC

## 2023-12-06 NOTE — Patient Instructions (Signed)

## 2023-12-06 NOTE — Progress Notes (Signed)
 [facility] CONSULT NOTE  Patient Care Team: Jolinda Norene HERO, DO as PCP - General (Family Medicine) Kristie Lamprey, MD as Consulting Physician (Gastroenterology) Sheldon Standing, MD as Consulting Physician (General Surgery) Shila Gustav GAILS, MD as Consulting Physician (Gastroenterology) Arelia Filippo, MD as Consulting Physician (Plastic Surgery) Ladora Ross Lacy Phebe, MD as Referring Physician (Optometry) Magda Debby SAILOR, MD as Consulting Physician (Vascular Surgery) Vanderbilt Debby, MD as Consulting Physician (General Surgery) Jaspal Pultz, MD as Consulting Physician (Hematology and Oncology)  CHIEF COMPLAINTS/PURPOSE OF CONSULTATION:  Newly diagnosed breast cancer  HISTORY OF PRESENTING ILLNESS:  Penny Howard 78 y.o. female is here because of recent diagnosis of right breast cancer  I reviewed her records extensively and collaborated the history with the patient.  SUMMARY OF ONCOLOGIC HISTORY: Oncology History  Malignant neoplasm of upper-outer quadrant of right breast in female, estrogen receptor positive (HCC)  06/05/2013 Initial Diagnosis   Malignant neoplasm of upper-outer quadrant of right breast in female, estrogen receptor positive (HCC)   06/11/2013 Cancer Staging   Staging form: Breast, AJCC 7th Edition - Clinical: Stage IIA (T2, N0, cM0) - Signed by Loretha Ash, MD on 07/24/2023 Laterality: Right Prognostic indicators: ER/PR +    HER 2 -   Staging comments: Staged at breast conference 06/11/13.    Recurrent breast cancer, right (HCC)  07/17/2023 Breast US    Diffuse heterogeneity with indistinct hypoechoic areas and shadowing throughout the mid to inferior aspect of the right axilla likely indicating an infiltrative process. No distinct focal mass visualized. No focal abnormality over the entire right mastectomy site.   07/17/2023 Pathology Results   Right breast needle core biopsy showed grade 2 invasive lobular cancer, ER +90% strong  staining PR 90% positive strong staining, HER2 negative by FISH and Ki-67 of 15%   07/24/2023 Initial Diagnosis   Recurrent breast cancer, right (HCC)   07/24/2023 Cancer Staging   Staging form: Breast, AJCC 8th Edition - Clinical: Stage IV (cTX, cNX, cM1, G2, ER+, PR+, HER2-) - Signed by Loretha Ash, MD on 09/13/2023 Histologic grading system: 3 grade system    History of Present Illness  Penny Howard is a 78 year old female with metastatic breast cancer who presents for follow-up after recent scans.  She is pleased with the imaging results. She continues to take verzenio  and letrozole . She tolerates it extremely well.  She is adherent to her medication regimen and her blood work today is satisfactory, though tumor markers are pending.   She is scheduled to receive her bone strengthener today, following a three-month hold due to dental treatment in July.  She enjoys social activities, such as going out for special dinners, and has plans to visit the [employer] in [place] next week.  MEDICAL HISTORY:  Past Medical History:  Diagnosis Date   Abnormal glucose tolerance test 05/02/2011   Allergy    Anxiety    Bruit of right carotid artery 07/18/2018   Cancer (HCC)    breast rt   Family history of breast cancer    Family history of leukemia    Family history of non-Hodgkin's lymphoma    Family history of pancreatic cancer    Family history of stomach cancer    GERD (gastroesophageal reflux disease)    High cholesterol    Hypertension    Liver fibrosis    Osteoporosis    Other malaise and fatigue 02/02/2010   Squamous cell carcinoma    Stress incontinence    Unspecified urinary  incontinence 11/03/2004   Wears contact lenses     SURGICAL HISTORY: Past Surgical History:  Procedure Laterality Date   ABDOMINAL HYSTERECTOMY  1990   ovaries intact b/l   BLADDER SURGERY  10   mesh   BREAST BIOPSY Right 07/17/2023   US  RT BREAST BX W LOC DEV 1ST LESION IMG BX SPEC US  GUIDE  07/17/2023 [facility] MAMMOGRAP[facility]   COLONOSCOPY     LIPOSUCTION WITH LIPOFILLING Right 04/24/2014   Procedure: LIPOFILLING TO RIGHT CHEST/RIGHT NIPPLE AREOLA COMPLEX CREATION WITH FULL THICKNESS SKIN GRAFT/REVISION OF RIGHT TRANSFLAP;  Surgeon: Earlis Ranks, MD;  Location: [facility];  Service: Plastics;  Laterality: Right;   MASTECTOMY Right 07/04/2013   MASTECTOMY W/ SENTINEL NODE BIOPSY  07/04/2013   RT TOTAL            DR MIKELL   SCAR REVISION Right 04/24/2014   Procedure: SCAR REVISION;  Surgeon: Earlis Ranks, MD;  Location: [facility];  Service: Plastics;  Laterality: Right;   SIMPLE MASTECTOMY WITH AXILLARY SENTINEL NODE BIOPSY Right 07/04/2013   Procedure: RIGHT TOTAL MASTECTOMY WITH RIGHT  AXILLARY SENTINEL LYMPH NODE BIOPSY;  Surgeon: Morene ONEIDA MIKELL, MD;  Location: [facility];  Service: General;  Laterality: Right;   SKIN FULL THICKNESS GRAFT Right 04/24/2014   Procedure: SKIN GRAFT FULL THICKNESS;  Surgeon: Earlis Ranks, MD;  Location: [facility];  Service: Plastics;  Laterality: Right;   SQUAMOUS CELL CARCINOMA EXCISION Right    Lower Leg   TRAM Right 12/30/2013   Procedure: TRANSVERSE RECTUS ABDOMINIS MYOCUTANEOUS (TRAM) FLAP FOR RIGHT BREAST RECONSTRUCTION;  Surgeon: Earlis Ranks, MD;  Location: [facility];  Service: Plastics;  Laterality: Right;    SOCIAL HISTORY: Social History   Socioeconomic History   Marital status: Married    Spouse name: Not on file   Number of children: 2   Years of education: 13   Highest education level: Some college, no degree  Occupational History   Occupation: retired  Tobacco Use   Smoking status: Never   Smokeless tobacco: Never  Vaping Use   Vaping status: Never Used  Substance and Sexual Activity   Alcohol use: Yes    Alcohol/week: 5.0 standard drinks of alcohol    Types: 5 Glasses of wine per week    Comment: occaionally   Drug use: No   Sexual activity: Yes  Other Topics Concern    Not on file  Social History Narrative   Not on file   Social Drivers of Health   Financial Resource Strain: Low Risk  (10/17/2023)   Overall Financial Resource Strain (CARDIA)    Difficulty of Paying Living Expenses: Not hard at all  Food Insecurity: No Food Insecurity (10/17/2023)   Hunger Vital Sign    Worried About Running Out of Food in the Last Year: Never true    Ran Out of Food in the Last Year: Never true  Transportation Needs: No Transportation Needs (10/17/2023)   PRAPARE - Administrator, Civil Service (Medical): No    Lack of Transportation (Non-Medical): No  Physical Activity: Insufficiently Active (10/17/2023)   Exercise Vital Sign    Days of Exercise per Week: 2 days    Minutes of Exercise per Session: 40 min  Stress: Stress Concern Present (10/17/2023)   [employer] of Occupational Health - Occupational Stress Questionnaire    Feeling of Stress: To some extent  Social Connections: Moderately Integrated (10/17/2023)   Social Connection and Isolation Panel  Frequency of Communication with Friends and Family: More than three times a week    Frequency of Social Gatherings with Friends and Family: Twice a week    Attends Religious Services: 1 to 4 times per year    Active Member of [employer] or Organizations: No    Attends Engineer, structural: Not on file    Marital Status: Married  Catering manager Violence: Not At Risk (03/29/2023)   Humiliation, Afraid, Rape, and Kick questionnaire    Fear of Current or Ex-Partner: No    Emotionally Abused: No    Physically Abused: No    Sexually Abused: No    FAMILY HISTORY: Family History  Problem Relation Age of Onset   Breast cancer Mother 59   Heart failure Father    Breast cancer Paternal Aunt 74   Lymphoma Cousin 35       non- hodgkins lymphoma   Stomach cancer Maternal Uncle 18   Pancreatic cancer Other    Leukemia Other    Colon cancer Neg Hx    Rectal cancer Neg Hx    Esophageal cancer  Neg Hx    Liver cancer Neg Hx     ALLERGIES:  is allergic to codeine.  MEDICATIONS:  Current Outpatient Medications  Medication Sig Dispense Refill   aspirin EC 81 MG tablet Take 81 mg by mouth daily.     cetirizine  (ZYRTEC ) 10 MG tablet Take 1 tablet (10 mg total) by mouth daily. 100 tablet 3   escitalopram  (LEXAPRO ) 10 MG tablet Take 1 tablet (10 mg total) by mouth daily. 100 tablet 3   esomeprazole  (NEXIUM ) 20 MG capsule Take 1 capsule (20 mg total) by mouth daily. 100 capsule 3   ivermectin (STROMECTOL) 3 MG TABS tablet Take 12 mg by mouth once.     letrozole  (FEMARA ) 2.5 MG tablet Take 1 tablet (2.5 mg total) by mouth daily. 90 tablet 3   lisinopril  (ZESTRIL ) 10 MG tablet Take 1 tablet (10 mg total) by mouth daily. 100 tablet 3   multivitamin-lutein (OCUVITE-LUTEIN) CAPS capsule Take 1 capsule by mouth daily. 250 mg capsule     rosuvastatin  (CRESTOR ) 10 MG tablet Take 1 tablet (10 mg total) by mouth daily. 100 tablet 3   traZODone  (DESYREL ) 50 MG tablet TAKE 1/2 TO 2 TABLETS AT   BEDTIME AS NEEDED FOR SLEEP 100 tablet 3   triamcinolone  cream (KENALOG ) 0.1 % Apply 1 Application topically 2 (two) times daily as needed (rash on chest x10 days max per flareup). 30 g 0   VERZENIO  100 MG tablet TAKE 1 TABLET BY MOUTH 2 TIMES A DAY 56 tablet 3   vitamin E 180 MG (400 UNITS) capsule Take 400 Units by mouth daily.     No current facility-administered medications for this visit.     PHYSICAL EXAMINATION: ECOG PERFORMANCE STATUS: 0 - Asymptomatic  Vitals:   12/06/23 1014  BP: (!) 125/54  Pulse: 67  Resp: 17  Temp: 98 F (36.7 C)  SpO2: 98%    Filed Weights   12/06/23 1014  Weight: 116 lb 3.2 oz (52.7 kg)    She appears well. Right axillary mass has nearly completely resolved, Great clinical response. No palpable mass in the chest wall on exam today. Chest: CTA bilaterally RRR No LE edema.  LABORATORY DATA:  I have reviewed the data as listed Lab Results  Component  Value Date   WBC 3.4 (L) 12/06/2023   HGB 10.9 (L) 12/06/2023   HCT 31.8 (  L) 12/06/2023   MCV 104.6 (H) 12/06/2023   PLT 209 12/06/2023   Lab Results  Component Value Date   NA 140 12/06/2023   K 4.4 12/06/2023   CL 108 12/06/2023   CO2 25 12/06/2023    RADIOGRAPHIC STUDIES: I have personally reviewed the radiological reports and agreed with the findings in the report.  ASSESSMENT AND PLAN:   Assessment and Plan Assessment & Plan Metastatic Invasive lobular carcinoma, ER positive, PR positive, Her 2 neg on abemaciclib  and letrozole . On Verzenio  and letrozole  with stable blood counts. No new symptoms or pain reported. She had excellent clinical response on exam today. - Continue Verzenio  and letrozole . - Recent imaging with remarkable response. - She had her dental clearance and is scheduled for zometa today. - we will proceed with zometa and see her back in 4 weeks with labs.  Time spent: 30 min including history, exam, review of records, counseling and coordination of care. All questions were answered. The patient knows to call the clinic with any problems, questions or concerns.    Amber Stalls, MD 12/06/23

## 2023-12-07 LAB — CANCER ANTIGEN 27.29: CA 27.29: 57.7 U/mL — ABNORMAL HIGH (ref 0.0–38.6)

## 2023-12-07 LAB — CANCER ANTIGEN 15-3: CA 15-3: 36.6 U/mL — ABNORMAL HIGH (ref 0.0–25.0)

## 2024-01-02 ENCOUNTER — Telehealth: Payer: Self-pay

## 2024-01-02 NOTE — Telephone Encounter (Signed)
 Left message on voicemail about upcoming visit.SABRA 11/13

## 2024-01-03 ENCOUNTER — Inpatient Hospital Stay: Attending: Hematology and Oncology | Admitting: Hematology and Oncology

## 2024-01-03 ENCOUNTER — Inpatient Hospital Stay

## 2024-01-03 VITALS — BP 142/65 | HR 67 | Temp 97.7°F | Resp 17 | Wt 115.8 lb

## 2024-01-03 DIAGNOSIS — Z800 Family history of malignant neoplasm of digestive organs: Secondary | ICD-10-CM | POA: Diagnosis not present

## 2024-01-03 DIAGNOSIS — C50911 Malignant neoplasm of unspecified site of right female breast: Secondary | ICD-10-CM

## 2024-01-03 DIAGNOSIS — Z170 Estrogen receptor positive status [ER+]: Secondary | ICD-10-CM | POA: Insufficient documentation

## 2024-01-03 DIAGNOSIS — Z806 Family history of leukemia: Secondary | ICD-10-CM | POA: Insufficient documentation

## 2024-01-03 DIAGNOSIS — Z79811 Long term (current) use of aromatase inhibitors: Secondary | ICD-10-CM | POA: Insufficient documentation

## 2024-01-03 DIAGNOSIS — C50919 Malignant neoplasm of unspecified site of unspecified female breast: Secondary | ICD-10-CM

## 2024-01-03 DIAGNOSIS — Z1721 Progesterone receptor positive status: Secondary | ICD-10-CM | POA: Diagnosis not present

## 2024-01-03 DIAGNOSIS — Z90710 Acquired absence of both cervix and uterus: Secondary | ICD-10-CM | POA: Diagnosis not present

## 2024-01-03 DIAGNOSIS — Z803 Family history of malignant neoplasm of breast: Secondary | ICD-10-CM | POA: Insufficient documentation

## 2024-01-03 DIAGNOSIS — Z1732 Human epidermal growth factor receptor 2 negative status: Secondary | ICD-10-CM | POA: Insufficient documentation

## 2024-01-03 DIAGNOSIS — C50411 Malignant neoplasm of upper-outer quadrant of right female breast: Secondary | ICD-10-CM | POA: Diagnosis not present

## 2024-01-03 DIAGNOSIS — Z807 Family history of other malignant neoplasms of lymphoid, hematopoietic and related tissues: Secondary | ICD-10-CM | POA: Diagnosis not present

## 2024-01-03 LAB — CBC WITH DIFFERENTIAL/PLATELET
Abs Immature Granulocytes: 0.01 K/uL (ref 0.00–0.07)
Basophils Absolute: 0.1 K/uL (ref 0.0–0.1)
Basophils Relative: 2 %
Eosinophils Absolute: 0.1 K/uL (ref 0.0–0.5)
Eosinophils Relative: 2 %
HCT: 32.3 % — ABNORMAL LOW (ref 36.0–46.0)
Hemoglobin: 10.9 g/dL — ABNORMAL LOW (ref 12.0–15.0)
Immature Granulocytes: 0 %
Lymphocytes Relative: 29 %
Lymphs Abs: 1 K/uL (ref 0.7–4.0)
MCH: 36.1 pg — ABNORMAL HIGH (ref 26.0–34.0)
MCHC: 33.7 g/dL (ref 30.0–36.0)
MCV: 107 fL — ABNORMAL HIGH (ref 80.0–100.0)
Monocytes Absolute: 0.3 K/uL (ref 0.1–1.0)
Monocytes Relative: 9 %
Neutro Abs: 2.1 K/uL (ref 1.7–7.7)
Neutrophils Relative %: 58 %
Platelets: 201 K/uL (ref 150–400)
RBC: 3.02 MIL/uL — ABNORMAL LOW (ref 3.87–5.11)
RDW: 12.2 % (ref 11.5–15.5)
WBC: 3.6 K/uL — ABNORMAL LOW (ref 4.0–10.5)
nRBC: 0 % (ref 0.0–0.2)

## 2024-01-03 LAB — CMP (CANCER CENTER ONLY)
ALT: 11 U/L (ref 0–44)
AST: 19 U/L (ref 15–41)
Albumin: 4.4 g/dL (ref 3.5–5.0)
Alkaline Phosphatase: 48 U/L (ref 38–126)
Anion gap: 6 (ref 5–15)
BUN: 19 mg/dL (ref 8–23)
CO2: 24 mmol/L (ref 22–32)
Calcium: 9.7 mg/dL (ref 8.9–10.3)
Chloride: 109 mmol/L (ref 98–111)
Creatinine: 0.88 mg/dL (ref 0.44–1.00)
GFR, Estimated: >60 mL/min (ref >60)
Glucose, Bld: 90 mg/dL (ref 70–99)
Potassium: 4.1 mmol/L (ref 3.5–5.1)
Sodium: 139 mmol/L (ref 135–145)
Total Bilirubin: 0.4 mg/dL (ref 0.0–1.2)
Total Protein: 7.2 g/dL (ref 6.5–8.1)

## 2024-01-03 NOTE — Progress Notes (Signed)
 [facility] CONSULT NOTE  Patient Care Team: Jolinda Norene HERO, DO as PCP - General (Family Medicine) Kristie Lamprey, MD as Consulting Physician (Gastroenterology) Sheldon Standing, MD as Consulting Physician (General Surgery) Shila Gustav GAILS, MD as Consulting Physician (Gastroenterology) Arelia Filippo, MD as Consulting Physician (Plastic Surgery) Ladora Ross Lacy Phebe, MD as Referring Physician (Optometry) Magda Debby SAILOR, MD as Consulting Physician (Vascular Surgery) Vanderbilt Debby, MD as Consulting Physician (General Surgery) Jataya Wann, MD as Consulting Physician (Hematology and Oncology)  CHIEF COMPLAINTS/PURPOSE OF CONSULTATION:  Newly diagnosed breast cancer  HISTORY OF PRESENTING ILLNESS:  SHABANA ARMENTROUT 78 y.o. female is here because of recent diagnosis of right breast cancer  I reviewed her records extensively and collaborated the history with the patient.  SUMMARY OF ONCOLOGIC HISTORY: Oncology History  Malignant neoplasm of upper-outer quadrant of right breast in female, estrogen receptor positive (HCC)  06/05/2013 Initial Diagnosis   Malignant neoplasm of upper-outer quadrant of right breast in female, estrogen receptor positive (HCC)   06/11/2013 Cancer Staging   Staging form: Breast, AJCC 7th Edition - Clinical: Stage IIA (T2, N0, cM0) - Signed by Loretha Ash, MD on 07/24/2023 Laterality: Right Prognostic indicators: ER/PR +    HER 2 -   Staging comments: Staged at breast conference 06/11/13.    Recurrent breast cancer, right (HCC)  07/17/2023 Breast US    Diffuse heterogeneity with indistinct hypoechoic areas and shadowing throughout the mid to inferior aspect of the right axilla likely indicating an infiltrative process. No distinct focal mass visualized. No focal abnormality over the entire right mastectomy site.   07/17/2023 Pathology Results   Right breast needle core biopsy showed grade 2 invasive lobular cancer, ER +90% strong  staining PR 90% positive strong staining, HER2 negative by FISH and Ki-67 of 15%   07/24/2023 Initial Diagnosis   Recurrent breast cancer, right (HCC)   07/24/2023 Cancer Staging   Staging form: Breast, AJCC 8th Edition - Clinical: Stage IV (cTX, cNX, cM1, G2, ER+, PR+, HER2-) - Signed by Loretha Ash, MD on 09/13/2023 Histologic grading system: 3 grade system    History of Present Illness   GAILE ALLMON is a 78 year old female with breast cancer who presents for follow-up regarding her treatment and tumor markers. She is accompanied by her husband, Mr. Stefanik.  She is currently undergoing treatment for breast cancer with Verzenio  and letrozole . Her tumor markers from the last test were down trending. She adheres to her medication schedule and feels well overall with no new symptoms or issues since her last visit.  She experienced a brief episode of diarrhea lasting two days, which resolved without medical intervention. She humorously attributes this to her husband having to clean the commode. She has returned to normal with no ongoing diarrhea.  She received her bone strengthener treatment without any issues, which is scheduled every three months. Her next appointment for this treatment is already scheduled for early January.  She enjoys dining out together, as evidenced by a recent visit to the [employer].  MEDICAL HISTORY:  Past Medical History:  Diagnosis Date   Abnormal glucose tolerance test 05/02/2011   Allergy    Anxiety    Bruit of right carotid artery 07/18/2018   Cancer (HCC)    breast rt   Family history of breast cancer    Family history of leukemia    Family history of non-Hodgkin's lymphoma    Family history of pancreatic cancer    Family history of  stomach cancer    GERD (gastroesophageal reflux disease)    High cholesterol    Hypertension    Liver fibrosis    Osteoporosis    Other malaise and fatigue 02/02/2010   Squamous cell carcinoma    Stress  incontinence    Unspecified urinary incontinence 11/03/2004   Wears contact lenses     SURGICAL HISTORY: Past Surgical History:  Procedure Laterality Date   ABDOMINAL HYSTERECTOMY  1990   ovaries intact b/l   BLADDER SURGERY  10   mesh   BREAST BIOPSY Right 07/17/2023   US  RT BREAST BX W LOC DEV 1ST LESION IMG BX SPEC US  GUIDE 07/17/2023 [facility]AMMOGRAP[facility]   COLONOSCOPY     LIPOSUCTION WITH LIPOFILLING Right 04/24/2014   Procedure: LIPOFILLING TO RIGHT CHEST/RIGHT NIPPLE AREOLA COMPLEX CREATION WITH FULL THICKNESS SKIN GRAFT/REVISION OF RIGHT TRANSFLAP;  Surgeon: Earlis Ranks, MD;  Location: [facility];  Service: Plastics;  Laterality: Right;   MASTECTOMY Right 07/04/2013   MASTECTOMY W/ SENTINEL NODE BIOPSY  07/04/2013   RT TOTAL            DR MIKELL   SCAR REVISION Right 04/24/2014   Procedure: SCAR REVISION;  Surgeon: Earlis Ranks, MD;  Location: [facility];  Service: Plastics;  Laterality: Right;   SIMPLE MASTECTOMY WITH AXILLARY SENTINEL NODE BIOPSY Right 07/04/2013   Procedure: RIGHT TOTAL MASTECTOMY WITH RIGHT  AXILLARY SENTINEL LYMPH NODE BIOPSY;  Surgeon: Morene ONEIDA Mikell, MD;  Location: [facility];  Service: General;  Laterality: Right;   SKIN FULL THICKNESS GRAFT Right 04/24/2014   Procedure: SKIN GRAFT FULL THICKNESS;  Surgeon: Earlis Ranks, MD;  Location:  SUR[facility]ERY [facility];  Service: Plastics;  Laterality: Right;   SQUAMOUS CELL CARCINOMA EXCISION Right    Lower Leg   TRAM Right 12/30/2013   Procedure: TRANSVERSE RECTUS ABDOMINIS MYOCUTANEOUS (TRAM) FLAP FOR RIGHT BREAST RECONSTRUCTION;  Surgeon: Earlis Ranks, MD;  Location: [facility];  Service: Plastics;  Laterality: Right;    SOCIAL HISTORY: Social History   Socioeconomic History   Marital status: Married    Spouse name: Not on file   Number of children: 2   Years of education: 13   Highest education level: Some college, no degree  Occupational History   Occupation:  retired  Tobacco Use   Smoking status: Never   Smokeless tobacco: Never  Vaping Use   Vaping status: Never Used  Substance and Sexual Activity   Alcohol use: Yes    Alcohol/week: 5.0 standard drinks of alcohol    Types: 5 Glasses of wine per week    Comment: occaionally   Drug use: No   Sexual activity: Yes  Other Topics Concern   Not on file  Social History Narrative   Not on file   Social Drivers of Health   Financial Resource Strain: Low Risk  (10/17/2023)   Overall Financial Resource Strain (CARDIA)    Difficulty of Paying Living Expenses: Not hard at all  Food Insecurity: No Food Insecurity (10/17/2023)   Hunger Vital Sign    Worried About Running Out of Food in the Last Year: Never true    Ran Out of Food in the Last Year: Never true  Transportation Needs: No Transportation Needs (10/17/2023)   PRAPARE - Administrator, Civil Service (Medical): No    Lack of Transportation (Non-Medical): No  Physical Activity: Insufficiently Active (10/17/2023)   Exercise Vital Sign    Days of Exercise per Week: 2  days    Minutes of Exercise per Session: 40 min  Stress: Stress Concern Present (10/17/2023)   [employer] of Occupational Health - Occupational Stress Questionnaire    Feeling of Stress: To some extent  Social Connections: Moderately Integrated (10/17/2023)   Social Connection and Isolation Panel    Frequency of Communication with Friends and Family: More than three times a week    Frequency of Social Gatherings with Friends and Family: Twice a week    Attends Religious Services: 1 to 4 times per year    Active Member of [employer] or Organizations: No    Attends Engineer, Structural: Not on file    Marital Status: Married  Catering Manager Violence: Not At Risk (03/29/2023)   Humiliation, Afraid, Rape, and Kick questionnaire    Fear of Current or Ex-Partner: No    Emotionally Abused: No    Physically Abused: No    Sexually Abused: No    FAMILY  HISTORY: Family History  Problem Relation Age of Onset   Breast cancer Mother 53   Heart failure Father    Breast cancer Paternal Aunt 41   Lymphoma Cousin 35       non- hodgkins lymphoma   Stomach cancer Maternal Uncle 18   Pancreatic cancer Other    Leukemia Other    Colon cancer Neg Hx    Rectal cancer Neg Hx    Esophageal cancer Neg Hx    Liver cancer Neg Hx     ALLERGIES:  is allergic to codeine.  MEDICATIONS:  Current Outpatient Medications  Medication Sig Dispense Refill   aspirin EC 81 MG tablet Take 81 mg by mouth daily.     cetirizine  (ZYRTEC ) 10 MG tablet Take 1 tablet (10 mg total) by mouth daily. 100 tablet 3   escitalopram  (LEXAPRO ) 10 MG tablet Take 1 tablet (10 mg total) by mouth daily. 100 tablet 3   esomeprazole  (NEXIUM ) 20 MG capsule Take 1 capsule (20 mg total) by mouth daily. 100 capsule 3   ivermectin (STROMECTOL) 3 MG TABS tablet Take 12 mg by mouth once.     letrozole  (FEMARA ) 2.5 MG tablet Take 1 tablet (2.5 mg total) by mouth daily. 90 tablet 3   lisinopril  (ZESTRIL ) 10 MG tablet Take 1 tablet (10 mg total) by mouth daily. 100 tablet 3   multivitamin-lutein (OCUVITE-LUTEIN) CAPS capsule Take 1 capsule by mouth daily. 250 mg capsule     rosuvastatin  (CRESTOR ) 10 MG tablet Take 1 tablet (10 mg total) by mouth daily. 100 tablet 3   traZODone  (DESYREL ) 50 MG tablet TAKE 1/2 TO 2 TABLETS AT   BEDTIME AS NEEDED FOR SLEEP 100 tablet 3   triamcinolone  cream (KENALOG ) 0.1 % Apply 1 Application topically 2 (two) times daily as needed (rash on chest x10 days max per flareup). 30 g 0   VERZENIO  100 MG tablet TAKE 1 TABLET BY MOUTH 2 TIMES A DAY 56 tablet 3   vitamin E 180 MG (400 UNITS) capsule Take 400 Units by mouth daily.     No current facility-administered medications for this visit.     PHYSICAL EXAMINATION: ECOG PERFORMANCE STATUS: 0 - Asymptomatic  Vitals:   01/03/24 1137  BP: (!) 142/65  Pulse: 67  Resp: 17  Temp: 97.7 F (36.5 C)  SpO2: 100%     Filed Weights   01/03/24 1137  Weight: 115 lb 12.8 oz (52.5 kg)    She appears well. Right axillary mass has completely resolved, complete  clinical response. No palpable mass in the chest wall on exam today. Chest: CTA bilaterally RRR No LE edema.  LABORATORY DATA:  I have reviewed the data as listed Lab Results  Component Value Date   WBC 3.6 (L) 01/03/2024   HGB 10.9 (L) 01/03/2024   HCT 32.3 (L) 01/03/2024   MCV 107.0 (H) 01/03/2024   PLT 201 01/03/2024   Lab Results  Component Value Date   NA 139 01/03/2024   K 4.1 01/03/2024   CL 109 01/03/2024   CO2 24 01/03/2024    RADIOGRAPHIC STUDIES: I have personally reviewed the radiological reports and agreed with the findings in the report.  ASSESSMENT AND PLAN:   Assessment and Plan Assessment & Plan Metastatic Invasive lobular carcinoma, ER positive, PR positive, Her 2 neg on abemaciclib  and letrozole . On Verzenio  and letrozole  with stable blood counts. No new symptoms or pain reported. She had excellent clinical response on exam today. -- Continue Verzenio  and letrozole . - Administer next bone strengthener in early January. - Follow-up in one month. - Plan scan in December or January.  Time spent: 20 min including history, exam, review of records, counseling and coordination of care. All questions were answered. The patient knows to call the clinic with any problems, questions or concerns.    Amber Stalls, MD 01/03/24

## 2024-01-04 LAB — CANCER ANTIGEN 15-3: CA 15-3: 30.3 U/mL — ABNORMAL HIGH (ref 0.0–25.0)

## 2024-01-04 LAB — CANCER ANTIGEN 27.29: CA 27.29: 50.7 U/mL — ABNORMAL HIGH (ref 0.0–38.6)

## 2024-01-30 ENCOUNTER — Telehealth: Payer: Self-pay

## 2024-01-30 NOTE — Telephone Encounter (Signed)
 Spoke with patient and confirmed appointment on 12/11

## 2024-01-31 ENCOUNTER — Inpatient Hospital Stay: Admitting: Hematology and Oncology

## 2024-01-31 ENCOUNTER — Inpatient Hospital Stay: Attending: Hematology and Oncology

## 2024-01-31 ENCOUNTER — Ambulatory Visit: Payer: Self-pay | Admitting: Hematology and Oncology

## 2024-01-31 VITALS — BP 136/69 | HR 73 | Temp 97.7°F | Resp 17 | Wt 115.4 lb

## 2024-01-31 DIAGNOSIS — Z806 Family history of leukemia: Secondary | ICD-10-CM | POA: Insufficient documentation

## 2024-01-31 DIAGNOSIS — Z79811 Long term (current) use of aromatase inhibitors: Secondary | ICD-10-CM | POA: Insufficient documentation

## 2024-01-31 DIAGNOSIS — Z807 Family history of other malignant neoplasms of lymphoid, hematopoietic and related tissues: Secondary | ICD-10-CM | POA: Diagnosis not present

## 2024-01-31 DIAGNOSIS — Z170 Estrogen receptor positive status [ER+]: Secondary | ICD-10-CM | POA: Diagnosis not present

## 2024-01-31 DIAGNOSIS — C50411 Malignant neoplasm of upper-outer quadrant of right female breast: Secondary | ICD-10-CM

## 2024-01-31 DIAGNOSIS — Z1721 Progesterone receptor positive status: Secondary | ICD-10-CM | POA: Diagnosis not present

## 2024-01-31 DIAGNOSIS — Z1732 Human epidermal growth factor receptor 2 negative status: Secondary | ICD-10-CM | POA: Insufficient documentation

## 2024-01-31 DIAGNOSIS — C50911 Malignant neoplasm of unspecified site of right female breast: Secondary | ICD-10-CM

## 2024-01-31 DIAGNOSIS — Z803 Family history of malignant neoplasm of breast: Secondary | ICD-10-CM | POA: Diagnosis not present

## 2024-01-31 DIAGNOSIS — Z9011 Acquired absence of right breast and nipple: Secondary | ICD-10-CM | POA: Diagnosis not present

## 2024-01-31 DIAGNOSIS — Z800 Family history of malignant neoplasm of digestive organs: Secondary | ICD-10-CM | POA: Diagnosis not present

## 2024-01-31 DIAGNOSIS — C50919 Malignant neoplasm of unspecified site of unspecified female breast: Secondary | ICD-10-CM

## 2024-01-31 LAB — CBC WITH DIFFERENTIAL/PLATELET
Abs Immature Granulocytes: 0.01 K/uL (ref 0.00–0.07)
Basophils Absolute: 0.1 K/uL (ref 0.0–0.1)
Basophils Relative: 2 %
Eosinophils Absolute: 0.1 K/uL (ref 0.0–0.5)
Eosinophils Relative: 2 %
HCT: 35.8 % — ABNORMAL LOW (ref 36.0–46.0)
Hemoglobin: 11.9 g/dL — ABNORMAL LOW (ref 12.0–15.0)
Immature Granulocytes: 0 %
Lymphocytes Relative: 37 %
Lymphs Abs: 1.3 K/uL (ref 0.7–4.0)
MCH: 35.5 pg — ABNORMAL HIGH (ref 26.0–34.0)
MCHC: 33.2 g/dL (ref 30.0–36.0)
MCV: 106.9 fL — ABNORMAL HIGH (ref 80.0–100.0)
Monocytes Absolute: 0.3 K/uL (ref 0.1–1.0)
Monocytes Relative: 10 %
Neutro Abs: 1.7 K/uL (ref 1.7–7.7)
Neutrophils Relative %: 49 %
Platelets: 225 K/uL (ref 150–400)
RBC: 3.35 MIL/uL — ABNORMAL LOW (ref 3.87–5.11)
RDW: 12 % (ref 11.5–15.5)
WBC: 3.5 K/uL — ABNORMAL LOW (ref 4.0–10.5)
nRBC: 0 % (ref 0.0–0.2)

## 2024-01-31 LAB — CMP (CANCER CENTER ONLY)
ALT: 19 U/L (ref 0–44)
AST: 28 U/L (ref 15–41)
Albumin: 4.8 g/dL (ref 3.5–5.0)
Alkaline Phosphatase: 55 U/L (ref 38–126)
Anion gap: 11 (ref 5–15)
BUN: 16 mg/dL (ref 8–23)
CO2: 23 mmol/L (ref 22–32)
Calcium: 10.3 mg/dL (ref 8.9–10.3)
Chloride: 106 mmol/L (ref 98–111)
Creatinine: 0.92 mg/dL (ref 0.44–1.00)
GFR, Estimated: >60 mL/min (ref >60)
Glucose, Bld: 102 mg/dL — ABNORMAL HIGH (ref 70–99)
Potassium: 5.2 mmol/L — ABNORMAL HIGH (ref 3.5–5.1)
Sodium: 140 mmol/L (ref 135–145)
Total Bilirubin: 0.4 mg/dL (ref 0.0–1.2)
Total Protein: 8 g/dL (ref 6.5–8.1)

## 2024-01-31 NOTE — Progress Notes (Signed)
 [facility] CONSULT NOTE  Patient Care Team: Jolinda Norene HERO, DO as PCP - General (Family Medicine) Kristie Lamprey, MD as Consulting Physician (Gastroenterology) Sheldon Standing, MD as Consulting Physician (General Surgery) Shila Gustav GAILS, MD as Consulting Physician (Gastroenterology) Arelia Filippo, MD as Consulting Physician (Plastic Surgery) Ladora Ross Lacy Phebe, MD as Referring Physician (Optometry) Magda Debby SAILOR, MD as Consulting Physician (Vascular Surgery) Vanderbilt Debby, MD as Consulting Physician (General Surgery) Clint Biello, MD as Consulting Physician (Hematology and Oncology)  CHIEF COMPLAINTS/PURPOSE OF CONSULTATION:  Newly diagnosed breast cancer  HISTORY OF PRESENTING ILLNESS:  Penny Howard 78 y.o. female is here because of recent diagnosis of right breast cancer  I reviewed her records extensively and collaborated the history with the patient.  SUMMARY OF ONCOLOGIC HISTORY: Oncology History  Malignant neoplasm of upper-outer quadrant of right breast in female, estrogen receptor positive (HCC)  06/05/2013 Initial Diagnosis   Malignant neoplasm of upper-outer quadrant of right breast in female, estrogen receptor positive (HCC)   06/11/2013 Cancer Staging   Staging form: Breast, AJCC 7th Edition - Clinical: Stage IIA (T2, N0, cM0) - Signed by Loretha Ash, MD on 07/24/2023 Laterality: Right Prognostic indicators: ER/PR +    HER 2 -   Staging comments: Staged at breast conference 06/11/13.    Recurrent breast cancer, right (HCC)  07/17/2023 Breast US    Diffuse heterogeneity with indistinct hypoechoic areas and shadowing throughout the mid to inferior aspect of the right axilla likely indicating an infiltrative process. No distinct focal mass visualized. No focal abnormality over the entire right mastectomy site.   07/17/2023 Pathology Results   Right breast needle core biopsy showed grade 2 invasive lobular cancer, ER +90% strong  staining PR 90% positive strong staining, HER2 negative by FISH and Ki-67 of 15%   07/24/2023 Initial Diagnosis   Recurrent breast cancer, right (HCC)   07/24/2023 Cancer Staging   Staging form: Breast, AJCC 8th Edition - Clinical: Stage IV (cTX, cNX, cM1, G2, ER+, PR+, HER2-) - Signed by Loretha Ash, MD on 09/13/2023 Histologic grading system: 3 grade system    History of Present Illness  Penny Howard is a 78 year old female with breast cancer who presents for follow-up regarding her treatment and tumor markers.  She is accompanied by her husband, Mr. Mckown.  She is currently undergoing treatment for breast cancer with Verzenio  and letrozole . She tolerates this extremely well. She denies any major issues with treatment, mild diarrhea. She also has noticed some dizziness with change in positions and she wondered if dizziness is because she is not drinking any water.  Rest of the pertinent 10 point ROS reviewed and neg.  MEDICAL HISTORY:  Past Medical History:  Diagnosis Date   Abnormal glucose tolerance test 05/02/2011   Allergy    Anxiety    Bruit of right carotid artery 07/18/2018   Cancer (HCC)    breast rt   Family history of breast cancer    Family history of leukemia    Family history of non-Hodgkin's lymphoma    Family history of pancreatic cancer    Family history of stomach cancer    GERD (gastroesophageal reflux disease)    High cholesterol    Hypertension    Liver fibrosis    Osteoporosis    Other malaise and fatigue 02/02/2010   Squamous cell carcinoma    Stress incontinence    Unspecified urinary incontinence 11/03/2004   Wears contact lenses     SURGICAL HISTORY:  Past Surgical History:  Procedure Laterality Date   ABDOMINAL HYSTERECTOMY  1990   ovaries intact b/l   BLADDER SURGERY  10   mesh   BREAST BIOPSY Right 07/17/2023   US  RT BREAST BX W LOC DEV 1ST LESION IMG BX SPEC US  GUIDE 07/17/2023 [facility]MOGR[facility]   COLONOSCOPY     LIPOSUCTION  WITH LIPOFILLING Right 04/24/2014   Procedure: LIPOFILLING TO RIGHT CHEST/RIGHT NIPPLE AREOLA COMPLEX CREATION WITH FULL THICKNESS SKIN GRAFT/REVISION OF RIGHT TRANSFLAP;  Surgeon: Earlis Ranks, MD;  Location: [facility];  Service: Plastics;  Laterality: Right;   MASTECTOMY Right 07/04/2013   MASTECTOMY W/ SENTINEL NODE BIOPSY  07/04/2013   RT TOTAL            DR MIKELL   SCAR REVISION Right 04/24/2014   Procedure: SCAR REVISION;  Surgeon: Earlis Ranks, MD;  Location: [facility];  Service: Plastics;  Laterality: Right;   SIMPLE MASTECTOMY WITH AXILLARY SENTINEL NODE BIOPSY Right 07/04/2013   Procedure: RIGHT TOTAL MASTECTOMY WITH RIGHT  AXILLARY SENTINEL LYMPH NODE BIOPSY;  Surgeon: Morene ONEIDA Mikell, MD;  Location: [facility];  Service: General;  Laterality: Right;   SKIN FULL THICKNESS GRAFT Right 04/24/2014   Procedure: SKIN GRAFT FULL THICKNESS;  Surgeon: Earlis Ranks, MD;  Location: [facility];  Service: Plastics;  Laterality: Right;   SQUAMOUS CELL CARCINOMA EXCISION Right    Lower Leg   TRAM Right 12/30/2013   Procedure: TRANSVERSE RECTUS ABDOMINIS MYOCUTANEOUS (TRAM) FLAP FOR RIGHT BREAST RECONSTRUCTION;  Surgeon: Earlis Ranks, MD;  Location: [facility];  Service: Plastics;  Laterality: Right;    SOCIAL HISTORY: Social History   Socioeconomic History   Marital status: Married    Spouse name: Not on file   Number of children: 2   Years of education: 13   Highest education level: Some college, no degree  Occupational History   Occupation: retired  Tobacco Use   Smoking status: Never   Smokeless tobacco: Never  Vaping Use   Vaping status: Never Used  Substance and Sexual Activity   Alcohol use: Yes    Alcohol/week: 5.0 standard drinks of alcohol    Types: 5 Glasses of wine per week    Comment: occaionally   Drug use: No   Sexual activity: Yes  Other Topics Concern   Not on file  Social History Narrative   Not on file    Social Drivers of Health   Tobacco Use: Low Risk (11/30/2023)   Patient History    Smoking Tobacco Use: Never    Smokeless Tobacco Use: Never    Passive Exposure: Not on file  Financial Resource Strain: Low Risk (10/17/2023)   Overall Financial Resource Strain (CARDIA)    Difficulty of Paying Living Expenses: Not hard at all  Food Insecurity: No Food Insecurity (10/17/2023)   Epic    Worried About Radiation Protection Practitioner of Food in the Last Year: Never true    Ran Out of Food in the Last Year: Never true  Transportation Needs: No Transportation Needs (10/17/2023)   Epic    Lack of Transportation (Medical): No    Lack of Transportation (Non-Medical): No  Physical Activity: Insufficiently Active (10/17/2023)   Exercise Vital Sign    Days of Exercise per Week: 2 days    Minutes of Exercise per Session: 40 min  Stress: Stress Concern Present (10/17/2023)   [employer] of Occupational Health - Occupational Stress Questionnaire    Feeling of Stress: To some  extent  Social Connections: Moderately Integrated (10/17/2023)   Social Connection and Isolation Panel    Frequency of Communication with Friends and Family: More than three times a week    Frequency of Social Gatherings with Friends and Family: Twice a week    Attends Religious Services: 1 to 4 times per year    Active Member of [employer] or Organizations: No    Attends Engineer, Structural: Not on file    Marital Status: Married  Catering Manager Violence: Not At Risk (03/29/2023)   Humiliation, Afraid, Rape, and Kick questionnaire    Fear of Current or Ex-Partner: No    Emotionally Abused: No    Physically Abused: No    Sexually Abused: No  Depression (PHQ2-9): Low Risk (11/30/2023)   Depression (PHQ2-9)    PHQ-2 Score: 1  Alcohol Screen: Low Risk (03/29/2023)   Alcohol Screen    Last Alcohol Screening Score (AUDIT): 0  Housing: Unknown (10/17/2023)   Epic    Unable to Pay for Housing in the Last Year: No    Number of Times  Moved in the Last Year: Not on file    Homeless in the Last Year: No  Utilities: Not At Risk (03/29/2023)   AHC Utilities    Threatened with loss of utilities: No  Health Literacy: Adequate Health Literacy (03/29/2023)   B1300 Health Literacy    Frequency of need for help with medical instructions: Never    FAMILY HISTORY: Family History  Problem Relation Age of Onset   Breast cancer Mother 67   Heart failure Father    Breast cancer Paternal Aunt 71   Lymphoma Cousin 35       non- hodgkins lymphoma   Stomach cancer Maternal Uncle 18   Pancreatic cancer Other    Leukemia Other    Colon cancer Neg Hx    Rectal cancer Neg Hx    Esophageal cancer Neg Hx    Liver cancer Neg Hx     ALLERGIES:  is allergic to codeine.  MEDICATIONS:  Current Outpatient Medications  Medication Sig Dispense Refill   aspirin EC 81 MG tablet Take 81 mg by mouth daily.     cetirizine  (ZYRTEC ) 10 MG tablet Take 1 tablet (10 mg total) by mouth daily. 100 tablet 3   escitalopram  (LEXAPRO ) 10 MG tablet Take 1 tablet (10 mg total) by mouth daily. 100 tablet 3   esomeprazole  (NEXIUM ) 20 MG capsule Take 1 capsule (20 mg total) by mouth daily. 100 capsule 3   ivermectin (STROMECTOL) 3 MG TABS tablet Take 12 mg by mouth once.     letrozole  (FEMARA ) 2.5 MG tablet Take 1 tablet (2.5 mg total) by mouth daily. 90 tablet 3   lisinopril  (ZESTRIL ) 10 MG tablet Take 1 tablet (10 mg total) by mouth daily. 100 tablet 3   multivitamin-lutein (OCUVITE-LUTEIN) CAPS capsule Take 1 capsule by mouth daily. 250 mg capsule     rosuvastatin  (CRESTOR ) 10 MG tablet Take 1 tablet (10 mg total) by mouth daily. 100 tablet 3   traZODone  (DESYREL ) 50 MG tablet TAKE 1/2 TO 2 TABLETS AT   BEDTIME AS NEEDED FOR SLEEP 100 tablet 3   triamcinolone  cream (KENALOG ) 0.1 % Apply 1 Application topically 2 (two) times daily as needed (rash on chest x10 days max per flareup). 30 g 0   VERZENIO  100 MG tablet TAKE 1 TABLET BY MOUTH 2 TIMES A DAY 56 tablet  3   vitamin E 180 MG (400 UNITS)  capsule Take 400 Units by mouth daily.     No current facility-administered medications for this visit.     PHYSICAL EXAMINATION: ECOG PERFORMANCE STATUS: 0 - Asymptomatic  Vitals:   01/31/24 1125  BP: 136/69  Pulse: 73  Resp: 17  Temp: 97.7 F (36.5 C)  SpO2: 99%    Filed Weights   01/31/24 1125  Weight: 115 lb 6.4 oz (52.3 kg)    She appears well. Right axillary mass has completely resolved, complete clinical response. No palpable mass in the chest wall on exam today. Chest: CTA bilaterally RRR No LE edema.  LABORATORY DATA:  I have reviewed the data as listed Lab Results  Component Value Date   WBC 3.5 (L) 01/31/2024   HGB 11.9 (L) 01/31/2024   HCT 35.8 (L) 01/31/2024   MCV 106.9 (H) 01/31/2024   PLT 225 01/31/2024   Lab Results  Component Value Date   NA 139 01/03/2024   K 4.1 01/03/2024   CL 109 01/03/2024   CO2 24 01/03/2024    RADIOGRAPHIC STUDIES: I have personally reviewed the radiological reports and agreed with the findings in the report.  ASSESSMENT AND PLAN:   Assessment and Plan Assessment & Plan Metastatic Invasive lobular carcinoma, ER positive, PR positive, Her 2 neg on abemaciclib  and letrozole . She had complete clinical response. No palpable breast mass or axillary mass She will continue verzenio  and letrozole  and will repeat imaging as scheduled Continue zometa  every 12 weeks as scheduled RTC in 4 weeks to see me or sooner PRN  Time spent: 20 min including history, exam, review of records, counseling and coordination of care. All questions were answered. The patient knows to call the clinic with any problems, questions or concerns.    Amber Stalls, MD 01/31/2024

## 2024-01-31 NOTE — Telephone Encounter (Signed)
-----   Message from Amber Stalls, MD sent at 01/31/2024  4:01 PM EST ----- Potassium is mildly elevated. I didn't see any potassium supplement on her meds, can u confirm this with her. I would recommend monitoring if she is not on any potassium supplements.

## 2024-01-31 NOTE — Telephone Encounter (Signed)
 Called pt with message below. Pt states that she is not taking any K+ supplements. Advised that we will continue to watch K+. Pt verbalized understanding

## 2024-02-01 LAB — CANCER ANTIGEN 15-3: CA 15-3: 34 U/mL — ABNORMAL HIGH (ref 0.0–25.0)

## 2024-02-01 LAB — CANCER ANTIGEN 27.29: CA 27.29: 42.7 U/mL — ABNORMAL HIGH (ref 0.0–38.6)

## 2024-02-15 ENCOUNTER — Other Ambulatory Visit: Payer: Self-pay | Admitting: Hematology and Oncology

## 2024-02-18 ENCOUNTER — Encounter (HOSPITAL_COMMUNITY): Payer: Self-pay

## 2024-02-18 ENCOUNTER — Encounter: Payer: Self-pay | Admitting: Hematology and Oncology

## 2024-02-18 ENCOUNTER — Ambulatory Visit (HOSPITAL_COMMUNITY)
Admission: RE | Admit: 2024-02-18 | Discharge: 2024-02-18 | Disposition: A | Source: Ambulatory Visit | Attending: Hematology and Oncology

## 2024-02-18 DIAGNOSIS — C50411 Malignant neoplasm of upper-outer quadrant of right female breast: Secondary | ICD-10-CM | POA: Diagnosis present

## 2024-02-18 DIAGNOSIS — Z170 Estrogen receptor positive status [ER+]: Secondary | ICD-10-CM | POA: Insufficient documentation

## 2024-02-18 MED ORDER — IOHEXOL 300 MG/ML  SOLN
100.0000 mL | Freq: Once | INTRAMUSCULAR | Status: AC | PRN
  Administered 2024-02-18: 100 mL via INTRAVENOUS

## 2024-02-22 ENCOUNTER — Other Ambulatory Visit: Payer: Self-pay | Admitting: Family Medicine

## 2024-02-22 DIAGNOSIS — L309 Dermatitis, unspecified: Secondary | ICD-10-CM

## 2024-02-26 ENCOUNTER — Other Ambulatory Visit: Payer: Self-pay | Admitting: Family Medicine

## 2024-02-28 ENCOUNTER — Inpatient Hospital Stay

## 2024-02-28 ENCOUNTER — Inpatient Hospital Stay: Attending: Hematology and Oncology | Admitting: Hematology and Oncology

## 2024-02-28 ENCOUNTER — Inpatient Hospital Stay: Attending: Hematology and Oncology

## 2024-02-28 VITALS — BP 115/55 | HR 67 | Temp 97.7°F | Resp 17 | Wt 115.5 lb

## 2024-02-28 DIAGNOSIS — Z806 Family history of leukemia: Secondary | ICD-10-CM | POA: Diagnosis not present

## 2024-02-28 DIAGNOSIS — Z1721 Progesterone receptor positive status: Secondary | ICD-10-CM | POA: Diagnosis not present

## 2024-02-28 DIAGNOSIS — Z90710 Acquired absence of both cervix and uterus: Secondary | ICD-10-CM | POA: Diagnosis not present

## 2024-02-28 DIAGNOSIS — C50911 Malignant neoplasm of unspecified site of right female breast: Secondary | ICD-10-CM

## 2024-02-28 DIAGNOSIS — Z79811 Long term (current) use of aromatase inhibitors: Secondary | ICD-10-CM | POA: Insufficient documentation

## 2024-02-28 DIAGNOSIS — C50919 Malignant neoplasm of unspecified site of unspecified female breast: Secondary | ICD-10-CM

## 2024-02-28 DIAGNOSIS — L299 Pruritus, unspecified: Secondary | ICD-10-CM | POA: Insufficient documentation

## 2024-02-28 DIAGNOSIS — Z170 Estrogen receptor positive status [ER+]: Secondary | ICD-10-CM | POA: Insufficient documentation

## 2024-02-28 DIAGNOSIS — C50411 Malignant neoplasm of upper-outer quadrant of right female breast: Secondary | ICD-10-CM

## 2024-02-28 DIAGNOSIS — Z1732 Human epidermal growth factor receptor 2 negative status: Secondary | ICD-10-CM | POA: Diagnosis not present

## 2024-02-28 DIAGNOSIS — Z803 Family history of malignant neoplasm of breast: Secondary | ICD-10-CM | POA: Insufficient documentation

## 2024-02-28 DIAGNOSIS — M810 Age-related osteoporosis without current pathological fracture: Secondary | ICD-10-CM | POA: Diagnosis not present

## 2024-02-28 DIAGNOSIS — N289 Disorder of kidney and ureter, unspecified: Secondary | ICD-10-CM | POA: Diagnosis not present

## 2024-02-28 DIAGNOSIS — Z807 Family history of other malignant neoplasms of lymphoid, hematopoietic and related tissues: Secondary | ICD-10-CM | POA: Insufficient documentation

## 2024-02-28 DIAGNOSIS — Z800 Family history of malignant neoplasm of digestive organs: Secondary | ICD-10-CM | POA: Diagnosis not present

## 2024-02-28 DIAGNOSIS — C7951 Secondary malignant neoplasm of bone: Secondary | ICD-10-CM | POA: Insufficient documentation

## 2024-02-28 LAB — CBC WITH DIFFERENTIAL/PLATELET
Abs Immature Granulocytes: 0.01 K/uL (ref 0.00–0.07)
Basophils Absolute: 0.1 K/uL (ref 0.0–0.1)
Basophils Relative: 2 %
Eosinophils Absolute: 0.1 K/uL (ref 0.0–0.5)
Eosinophils Relative: 2 %
HCT: 32.4 % — ABNORMAL LOW (ref 36.0–46.0)
Hemoglobin: 10.9 g/dL — ABNORMAL LOW (ref 12.0–15.0)
Immature Granulocytes: 0 %
Lymphocytes Relative: 36 %
Lymphs Abs: 1.3 K/uL (ref 0.7–4.0)
MCH: 35.4 pg — ABNORMAL HIGH (ref 26.0–34.0)
MCHC: 33.6 g/dL (ref 30.0–36.0)
MCV: 105.2 fL — ABNORMAL HIGH (ref 80.0–100.0)
Monocytes Absolute: 0.3 K/uL (ref 0.1–1.0)
Monocytes Relative: 9 %
Neutro Abs: 1.8 K/uL (ref 1.7–7.7)
Neutrophils Relative %: 51 %
Platelets: 206 K/uL (ref 150–400)
RBC: 3.08 MIL/uL — ABNORMAL LOW (ref 3.87–5.11)
RDW: 12.2 % (ref 11.5–15.5)
WBC: 3.6 K/uL — ABNORMAL LOW (ref 4.0–10.5)
nRBC: 0 % (ref 0.0–0.2)

## 2024-02-28 LAB — CMP (CANCER CENTER ONLY)
ALT: 17 U/L (ref 0–44)
AST: 29 U/L (ref 15–41)
Albumin: 4.6 g/dL (ref 3.5–5.0)
Alkaline Phosphatase: 44 U/L (ref 38–126)
Anion gap: 13 (ref 5–15)
BUN: 18 mg/dL (ref 8–23)
CO2: 22 mmol/L (ref 22–32)
Calcium: 9.6 mg/dL (ref 8.9–10.3)
Chloride: 103 mmol/L (ref 98–111)
Creatinine: 1.18 mg/dL — ABNORMAL HIGH (ref 0.44–1.00)
GFR, Estimated: 48 mL/min — ABNORMAL LOW
Glucose, Bld: 120 mg/dL — ABNORMAL HIGH (ref 70–99)
Potassium: 4.4 mmol/L (ref 3.5–5.1)
Sodium: 137 mmol/L (ref 135–145)
Total Bilirubin: 0.4 mg/dL (ref 0.0–1.2)
Total Protein: 7.5 g/dL (ref 6.5–8.1)

## 2024-02-28 MED ORDER — SODIUM CHLORIDE 0.9 % IV SOLN: INTRAVENOUS | Status: DC

## 2024-02-28 MED ORDER — ZOLEDRONIC ACID 4 MG/100ML IV SOLN
4.0000 mg | Freq: Once | INTRAVENOUS | Status: AC
  Administered 2024-02-28: 4 mg via INTRAVENOUS
  Filled 2024-02-28: qty 100

## 2024-02-28 NOTE — Patient Instructions (Signed)

## 2024-02-28 NOTE — Progress Notes (Signed)
 [facility] CONSULT NOTE  Patient Care Team: Jolinda Norene HERO, DO as PCP - General (Family Medicine) Kristie Lamprey, MD as Consulting Physician (Gastroenterology) Sheldon Standing, MD as Consulting Physician (General Surgery) Shila Gustav GAILS, MD as Consulting Physician (Gastroenterology) Arelia Filippo, MD as Consulting Physician (Plastic Surgery) Ladora Ross Lacy Phebe, MD as Referring Physician (Optometry) Magda Debby SAILOR, MD as Consulting Physician (Vascular Surgery) Vanderbilt Debby, MD as Consulting Physician (General Surgery) Effie Wahlert, MD as Consulting Physician (Hematology and Oncology)  CHIEF COMPLAINTS/PURPOSE OF CONSULTATION:  Newly diagnosed breast cancer  HISTORY OF PRESENTING ILLNESS:  Penny Howard 79 y.o. female is here because of recent diagnosis of right breast cancer  I reviewed her records extensively and collaborated the history with the patient.  SUMMARY OF ONCOLOGIC HISTORY: Oncology History  Malignant neoplasm of upper-outer quadrant of right breast in female, estrogen receptor positive (HCC)  06/05/2013 Initial Diagnosis   Malignant neoplasm of upper-outer quadrant of right breast in female, estrogen receptor positive (HCC)   06/11/2013 Cancer Staging   Staging form: Breast, AJCC 7th Edition - Clinical: Stage IIA (T2, N0, cM0) - Signed by Loretha Ash, MD on 07/24/2023 Laterality: Right Prognostic indicators: ER/PR +    HER 2 -   Staging comments: Staged at breast conference 06/11/13.    Recurrent breast cancer, right (HCC)  07/17/2023 Breast US    Diffuse heterogeneity with indistinct hypoechoic areas and shadowing throughout the mid to inferior aspect of the right axilla likely indicating an infiltrative process. No distinct focal mass visualized. No focal abnormality over the entire right mastectomy site.   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  She is currently receiving abemaciclib  twice daily and letrozole  once daily, with good tolerance to therapy. She reports only occasional mild pruritic skin eruptions, described as small, mildly itchy spots, which do not interfere with daily activities. No other significant adverse effects are noted, and she maintains her usual level of activity.  She expresses satisfaction with these findings and reports no new symptoms.  MEDICAL HISTORY:  Past Medical History:  Diagnosis Date   Abnormal glucose tolerance test 05/02/2011   Allergy    Anxiety    Bruit of right carotid artery 07/18/2018   Cancer (HCC)    breast rt   Family history of breast cancer    Family history of leukemia    Family history of non-Hodgkin's lymphoma    Family history of pancreatic cancer    Family history of stomach cancer    GERD (gastroesophageal reflux disease)    High cholesterol    Hypertension    Liver fibrosis    Osteoporosis    Other malaise and fatigue 02/02/2010   Squamous cell carcinoma    Stress incontinence    Unspecified urinary incontinence 11/03/2004   Wears contact lenses     SURGICAL HISTORY: Past  Surgical History:  Procedure Laterality Date   ABDOMINAL HYSTERECTOMY  1990   ovaries intact b/l   BLADDER SURGERY  10   mesh   BREAST BIOPSY Right 07/17/2023   US  RT BREAST BX W LOC DEV 1ST LESION IMG BX SPEC US  GUIDE  07/17/2023 [facility]MO[facility]   COLONOSCOPY     LIPOSUCTION WITH LIPOFILLING Right 04/24/2014   Procedure: LIPOFILLING TO RIGHT CHEST/RIGHT NIPPLE AREOLA COMPLEX CREATION WITH FULL THICKNESS SKIN GRAFT/REVISION OF RIGHT TRANSFLAP;  Surgeon: Earlis Ranks, MD;  Location: [facility];  Service: Plastics;  Laterality: Right;   MASTECTOMY Right 07/04/2013   MASTECTOMY W/ SENTINEL NODE BIOPSY  07/04/2013   RT TOTAL            DR MIKELL   SCAR REVISION Right 04/24/2014   Procedure: SCAR REVISION;  Surgeon: Earlis Ranks, MD;  Location: [facility];  Service: Plastics;  Laterality: Right;   SIMPLE MASTECTOMY WITH AXILLARY SENTINEL NODE BIOPSY Right 07/04/2013   Procedure: RIGHT TOTAL MASTECTOMY WITH RIGHT  AXILLARY SENTINEL LYMPH NODE BIOPSY;  Surgeon: Morene ONEIDA Mikell, MD;  Location: [facility];  Service: General;  Laterality: Right;   SKIN FULL THICKNESS GRAFT Right 04/24/2014   Procedure: SKIN GRAFT FULL THICKNESS;  Surgeon: Earlis Ranks, MD;  Location: [facility];  Service: Plastics;  Laterality: Right;   SQUAMOUS CELL CARCINOMA EXCISION Right    Lower Leg   TRAM Right 12/30/2013   Procedure: TRANSVERSE RECTUS ABDOMINIS MYOCUTANEOUS (TRAM) FLAP FOR RIGHT BREAST RECONSTRUCTION;  Surgeon: Earlis Ranks, MD;  Location: [facility];  Service: Plastics;  Laterality: Right;    SOCIAL HISTORY: Social History   Socioeconomic History   Marital status: Married    Spouse name: Not on file   Number of children: 2   Years of education: 13   Highest education level: Some college, no degree  Occupational History   Occupation: retired  Tobacco Use   Smoking status: Never   Smokeless tobacco: Never  Vaping Use   Vaping status: Never Used  Substance and Sexual Activity   Alcohol use: Yes    Alcohol/week: 5.0 standard drinks of alcohol    Types: 5 Glasses of wine per week    Comment: occaionally   Drug use: No   Sexual activity: Yes  Other Topics Concern    Not on file  Social History Narrative   Not on file   Social Drivers of Health   Tobacco Use: Low Risk (11/30/2023)   Patient History    Smoking Tobacco Use: Never    Smokeless Tobacco Use: Never    Passive Exposure: Not on file  Financial Resource Strain: Low Risk (10/17/2023)   Overall Financial Resource Strain (CARDIA)    Difficulty of Paying Living Expenses: Not hard at all  Food Insecurity: No Food Insecurity (10/17/2023)   Epic    Worried About Radiation Protection Practitioner of Food in the Last Year: Never true    Ran Out of Food in the Last Year: Never true  Transportation Needs: No Transportation Needs (10/17/2023)   Epic    Lack of Transportation (Medical): No    Lack of Transportation (Non-Medical): No  Physical Activity: Insufficiently Active (10/17/2023)   Exercise Vital Sign    Days of Exercise per Week: 2 days    Minutes of Exercise per Session: 40 min  Stress: Stress Concern Present (10/17/2023)   [employer] of Occupational Health - Occupational Stress Questionnaire    Feeling of Stress: To some extent  Social Connections: Moderately Integrated (10/17/2023)   Social Connection and Isolation Panel    Frequency of Communication with Friends and Family: More than three times a week    Frequency of Social Gatherings with Friends and Family: Twice a week    Attends Religious Services: 1 to 4 times per year    Active Member of [employer] or Organizations: No    Attends Engineer, Structural: Not on file    Marital Status: Married  Catering Manager Violence: Not At Risk (03/29/2023)   Humiliation, Afraid, Rape, and Kick questionnaire    Fear of Current or Ex-Partner: No    Emotionally Abused: No    Physically Abused: No    Sexually Abused: No  Depression (PHQ2-9): Low Risk (02/28/2024)   Depression (PHQ2-9)    PHQ-2 Score: 0  Alcohol Screen: Low Risk (03/29/2023)   Alcohol Screen    Last Alcohol Screening Score (AUDIT): 0  Housing: Unknown (10/17/2023)   Epic    Unable to Pay  for Housing in the Last Year: No    Number of Times Moved in the Last Year: Not on file    Homeless in the Last Year: No  Utilities: Not At Risk (03/29/2023)   AHC Utilities    Threatened with loss of utilities: No  Health Literacy: Adequate Health Literacy (03/29/2023)   B1300 Health Literacy    Frequency of need for help with medical instructions: Never    FAMILY HISTORY: Family History  Problem Relation Age of Onset   Breast cancer Mother 31   Heart failure Father    Breast cancer Paternal Aunt 72   Lymphoma Cousin 35       non- hodgkins lymphoma   Stomach cancer Maternal Uncle 18   Pancreatic cancer Other    Leukemia Other    Colon cancer Neg Hx    Rectal cancer Neg Hx    Esophageal cancer Neg Hx    Liver cancer Neg Hx     ALLERGIES:  is allergic to codeine.  MEDICATIONS:  Current Outpatient Medications  Medication Sig Dispense Refill   aspirin EC 81 MG tablet Take 81 mg by mouth daily.     cetirizine  (ZYRTEC ) 10 MG tablet Take 1 tablet (10 mg total) by mouth daily. 100 tablet 3   escitalopram  (LEXAPRO ) 10 MG tablet Take 1 tablet (10 mg total) by mouth daily. 100 tablet 3   esomeprazole  (NEXIUM ) 20 MG capsule Take 1 capsule (20 mg total) by mouth daily. 100 capsule 3   ivermectin (STROMECTOL) 3 MG TABS tablet Take 12 mg by mouth once.     letrozole  (FEMARA ) 2.5 MG tablet Take 1 tablet (2.5 mg total) by mouth daily. 90 tablet 3   lisinopril  (ZESTRIL ) 10 MG tablet Take 1 tablet (10 mg total) by mouth daily. 100 tablet 3   multivitamin-lutein (OCUVITE-LUTEIN) CAPS capsule Take 1 capsule by mouth daily. 250 mg capsule     rosuvastatin  (CRESTOR ) 10 MG tablet Take 1 tablet (10 mg total) by mouth daily. 100 tablet 3   traZODone  (DESYREL ) 50 MG tablet TAKE 1/2 TO 2 TABLETS AT   BEDTIME AS NEEDED FOR SLEEP 100 tablet 3   triamcinolone  cream (KENALOG ) 0.1 % APPLY TOPICALLY TWICE DAILY AS NEEDED (RASH ON CHEST FOR 10 DAYS, MAX PER FLARE UP) 30 g 0   VERZENIO  100 MG tablet TAKE 1  TABLET BY MOUTH 2 TIMES A DAY 56 tablet 3   vitamin E 180 MG (400 UNITS) capsule Take 400 Units  by mouth daily.     No current facility-administered medications for this visit.     PHYSICAL EXAMINATION: ECOG PERFORMANCE STATUS: 0 - Asymptomatic  Vitals:   02/28/24 1137  BP: (!) 115/55  Pulse: 67  Resp: 17  Temp: 97.7 F (36.5 C)  SpO2: 99%    Filed Weights   02/28/24 1137  Weight: 115 lb 8 oz (52.4 kg)    She appears well. Right axillary mass has completely resolved, complete clinical response. No palpable mass in the chest wall on exam today. Chest: CTA bilaterally RRR No LE edema.  LABORATORY DATA:  I have reviewed the data as listed Lab Results  Component Value Date   WBC 3.6 (L) 02/28/2024   HGB 10.9 (L) 02/28/2024   HCT 32.4 (L) 02/28/2024   MCV 105.2 (H) 02/28/2024   PLT 206 02/28/2024   Lab Results  Component Value Date   NA 137 02/28/2024   K 4.4 02/28/2024   CL 103 02/28/2024   CO2 22 02/28/2024    RADIOGRAPHIC STUDIES: I have personally reviewed the radiological reports and agreed with the findings in the report.  ASSESSMENT AND PLAN:   Assessment and Plan Assessment & Plan Metastatic Invasive lobular carcinoma, ER positive, PR positive, Her 2 neg on abemaciclib  and letrozole . She had complete clinical response.  She tolerates Verzenio  and letrozole  well. - Continue Verzenio  twice daily and letrozole  once daily. - Reviewed imaging confirming no active disease. - Reassured regarding persistent bone lesions. - Follow-up in 1-2 months, with earlier contact if needed.  Drug-induced nephropathy Mild elevation in kidney function tests due to Verzenio , likely a false elevation from creatinine competition. No intervention needed. - Monitor renal function via lab studies. - Advise adequate hydration.  Drug-induced skin eruption Occasional mild pruritic eruptions managed conservatively. - Recommend topical diphenhydramine  cream for localized  pruritus. - Discuss option for oral antihistamine if eruptions worsen.  Time spent: 30 min including history, exam, review of records, counseling and coordination of care. All questions were answered. The patient knows to call the clinic with any problems, questions or concerns.    Amber Stalls, MD 02/28/2024

## 2024-02-29 LAB — CANCER ANTIGEN 27.29: CA 27.29: 27 U/mL (ref 0.0–38.6)

## 2024-02-29 LAB — CANCER ANTIGEN 15-3: CA 15-3: 25.7 U/mL — ABNORMAL HIGH (ref 0.0–25.0)

## 2024-03-24 ENCOUNTER — Encounter: Payer: Self-pay | Admitting: Hematology and Oncology

## 2024-03-25 ENCOUNTER — Other Ambulatory Visit: Payer: Self-pay

## 2024-03-25 ENCOUNTER — Inpatient Hospital Stay: Attending: Hematology and Oncology

## 2024-03-25 ENCOUNTER — Telehealth: Payer: Self-pay

## 2024-03-25 DIAGNOSIS — C50911 Malignant neoplasm of unspecified site of right female breast: Secondary | ICD-10-CM

## 2024-03-25 DIAGNOSIS — C50919 Malignant neoplasm of unspecified site of unspecified female breast: Secondary | ICD-10-CM

## 2024-03-25 DIAGNOSIS — C50411 Malignant neoplasm of upper-outer quadrant of right female breast: Secondary | ICD-10-CM

## 2024-03-25 LAB — URINALYSIS, COMPLETE (UACMP) WITH MICROSCOPIC
Bacteria, UA: NONE SEEN
Bilirubin Urine: NEGATIVE
Glucose, UA: NEGATIVE mg/dL
Hgb urine dipstick: NEGATIVE
Ketones, ur: NEGATIVE mg/dL
Leukocytes,Ua: NEGATIVE
Nitrite: NEGATIVE
Protein, ur: NEGATIVE mg/dL
Specific Gravity, Urine: 1.013 (ref 1.005–1.030)
pH: 6 (ref 5.0–8.0)

## 2024-03-25 NOTE — Telephone Encounter (Signed)
 RN attempted to call pt x1, no answer, LVM instructing pt to call to discuss her message regarding bubbles in urine. Dr. Loretha recommended UA for potential proteinuria. RN sent pt mychart message response as well.

## 2024-03-25 NOTE — Progress Notes (Signed)
 Pt messaged reporting bubbles in urine. RN reported symptoms to Iruku MD who ordered a U/A on pt. RN placed order for U/A and called pt to schedule the appt. Pt verbalized understanding.

## 2024-03-26 ENCOUNTER — Ambulatory Visit: Payer: Self-pay | Admitting: Hematology and Oncology

## 2024-03-26 NOTE — Telephone Encounter (Signed)
 RN notified pt of results of her UA and advised her to monitor for any signs of UTI like burning or frequency and to notify us  if she experiences any. Pt verbalized understanding.

## 2024-03-26 NOTE — Telephone Encounter (Signed)
-----   Message from Amber Stalls, MD sent at 03/26/2024  7:17 AM EST ----- UA is clear.

## 2024-04-24 ENCOUNTER — Inpatient Hospital Stay

## 2024-04-24 ENCOUNTER — Inpatient Hospital Stay: Admitting: Hematology and Oncology

## 2024-05-30 ENCOUNTER — Ambulatory Visit: Payer: Self-pay | Admitting: Family Medicine

## 2024-12-02 ENCOUNTER — Encounter: Payer: Self-pay | Admitting: Family Medicine
